# Patient Record
Sex: Female | Born: 1935
Health system: Southern US, Community
[De-identification: ages and names within clinical notes are randomized; demographics above are authoritative.]

## PROBLEM LIST (undated history)

## (undated) DIAGNOSIS — R4701 Aphasia: Secondary | ICD-10-CM

## (undated) DIAGNOSIS — M1990 Unspecified osteoarthritis, unspecified site: Secondary | ICD-10-CM

## (undated) DIAGNOSIS — I639 Cerebral infarction, unspecified: Secondary | ICD-10-CM

## (undated) DIAGNOSIS — I4891 Unspecified atrial fibrillation: Secondary | ICD-10-CM

## (undated) DIAGNOSIS — H3530 Unspecified macular degeneration: Secondary | ICD-10-CM

## (undated) DIAGNOSIS — H269 Unspecified cataract: Secondary | ICD-10-CM

## (undated) DIAGNOSIS — E079 Disorder of thyroid, unspecified: Secondary | ICD-10-CM

## (undated) HISTORY — DX: Aphasia: R47.01

## (undated) HISTORY — PX: ABDOMINAL HYSTERECTOMY: SHX81

## (undated) HISTORY — PX: EYE SURGERY: SHX253

## (undated) HISTORY — DX: Unspecified cataract: H26.9

## (undated) HISTORY — DX: Unspecified macular degeneration: H35.30

## (undated) HISTORY — DX: Cerebral infarction, unspecified: I63.9

## (undated) HISTORY — PX: JOINT REPLACEMENT: SHX530

## (undated) HISTORY — DX: Unspecified osteoarthritis, unspecified site: M19.90

## (undated) HISTORY — DX: Disorder of thyroid, unspecified: E07.9

## (undated) HISTORY — PX: BRAIN SURGERY: SHX531

---

## 2011-10-10 DIAGNOSIS — E7800 Pure hypercholesterolemia, unspecified: Secondary | ICD-10-CM | POA: Diagnosis not present

## 2011-10-10 DIAGNOSIS — E039 Hypothyroidism, unspecified: Secondary | ICD-10-CM | POA: Diagnosis not present

## 2011-10-13 DIAGNOSIS — E039 Hypothyroidism, unspecified: Secondary | ICD-10-CM | POA: Diagnosis not present

## 2011-10-13 DIAGNOSIS — R0989 Other specified symptoms and signs involving the circulatory and respiratory systems: Secondary | ICD-10-CM | POA: Diagnosis not present

## 2011-10-13 DIAGNOSIS — E7800 Pure hypercholesterolemia, unspecified: Secondary | ICD-10-CM | POA: Diagnosis not present

## 2011-10-13 DIAGNOSIS — M25569 Pain in unspecified knee: Secondary | ICD-10-CM | POA: Diagnosis not present

## 2011-10-25 DIAGNOSIS — H2589 Other age-related cataract: Secondary | ICD-10-CM | POA: Diagnosis not present

## 2011-10-25 DIAGNOSIS — H35379 Puckering of macula, unspecified eye: Secondary | ICD-10-CM | POA: Diagnosis not present

## 2011-10-25 DIAGNOSIS — H02409 Unspecified ptosis of unspecified eyelid: Secondary | ICD-10-CM | POA: Diagnosis not present

## 2011-10-25 DIAGNOSIS — Z961 Presence of intraocular lens: Secondary | ICD-10-CM | POA: Diagnosis not present

## 2011-11-01 DIAGNOSIS — M1710 Unilateral primary osteoarthritis, unspecified knee: Secondary | ICD-10-CM | POA: Diagnosis not present

## 2011-11-22 DIAGNOSIS — M1710 Unilateral primary osteoarthritis, unspecified knee: Secondary | ICD-10-CM | POA: Diagnosis not present

## 2011-11-29 DIAGNOSIS — M1710 Unilateral primary osteoarthritis, unspecified knee: Secondary | ICD-10-CM | POA: Diagnosis not present

## 2011-12-04 DIAGNOSIS — M79609 Pain in unspecified limb: Secondary | ICD-10-CM | POA: Diagnosis not present

## 2011-12-06 DIAGNOSIS — M1710 Unilateral primary osteoarthritis, unspecified knee: Secondary | ICD-10-CM | POA: Diagnosis not present

## 2011-12-11 DIAGNOSIS — M1710 Unilateral primary osteoarthritis, unspecified knee: Secondary | ICD-10-CM | POA: Diagnosis not present

## 2011-12-13 DIAGNOSIS — M1710 Unilateral primary osteoarthritis, unspecified knee: Secondary | ICD-10-CM | POA: Diagnosis not present

## 2011-12-13 DIAGNOSIS — M79609 Pain in unspecified limb: Secondary | ICD-10-CM | POA: Diagnosis not present

## 2011-12-18 DIAGNOSIS — M79609 Pain in unspecified limb: Secondary | ICD-10-CM | POA: Diagnosis not present

## 2011-12-18 DIAGNOSIS — M1710 Unilateral primary osteoarthritis, unspecified knee: Secondary | ICD-10-CM | POA: Diagnosis not present

## 2011-12-20 DIAGNOSIS — M1710 Unilateral primary osteoarthritis, unspecified knee: Secondary | ICD-10-CM | POA: Diagnosis not present

## 2011-12-20 DIAGNOSIS — M79609 Pain in unspecified limb: Secondary | ICD-10-CM | POA: Diagnosis not present

## 2011-12-25 DIAGNOSIS — M1710 Unilateral primary osteoarthritis, unspecified knee: Secondary | ICD-10-CM | POA: Diagnosis not present

## 2011-12-27 DIAGNOSIS — M1710 Unilateral primary osteoarthritis, unspecified knee: Secondary | ICD-10-CM | POA: Diagnosis not present

## 2012-01-01 DIAGNOSIS — M1710 Unilateral primary osteoarthritis, unspecified knee: Secondary | ICD-10-CM | POA: Diagnosis not present

## 2012-01-03 DIAGNOSIS — M1710 Unilateral primary osteoarthritis, unspecified knee: Secondary | ICD-10-CM | POA: Diagnosis not present

## 2012-01-08 DIAGNOSIS — M1710 Unilateral primary osteoarthritis, unspecified knee: Secondary | ICD-10-CM | POA: Diagnosis not present

## 2012-01-10 DIAGNOSIS — M1710 Unilateral primary osteoarthritis, unspecified knee: Secondary | ICD-10-CM | POA: Diagnosis not present

## 2012-01-17 DIAGNOSIS — M545 Low back pain, unspecified: Secondary | ICD-10-CM | POA: Diagnosis not present

## 2012-01-17 DIAGNOSIS — M1710 Unilateral primary osteoarthritis, unspecified knee: Secondary | ICD-10-CM | POA: Diagnosis not present

## 2012-02-01 DIAGNOSIS — M545 Low back pain, unspecified: Secondary | ICD-10-CM | POA: Diagnosis not present

## 2012-02-01 DIAGNOSIS — M1710 Unilateral primary osteoarthritis, unspecified knee: Secondary | ICD-10-CM | POA: Diagnosis not present

## 2012-02-08 DIAGNOSIS — M79609 Pain in unspecified limb: Secondary | ICD-10-CM | POA: Diagnosis not present

## 2012-02-08 DIAGNOSIS — M545 Low back pain, unspecified: Secondary | ICD-10-CM | POA: Diagnosis not present

## 2012-02-15 DIAGNOSIS — M545 Low back pain, unspecified: Secondary | ICD-10-CM | POA: Diagnosis not present

## 2012-02-15 DIAGNOSIS — M1710 Unilateral primary osteoarthritis, unspecified knee: Secondary | ICD-10-CM | POA: Diagnosis not present

## 2012-02-22 DIAGNOSIS — M1710 Unilateral primary osteoarthritis, unspecified knee: Secondary | ICD-10-CM | POA: Diagnosis not present

## 2012-02-22 DIAGNOSIS — M79609 Pain in unspecified limb: Secondary | ICD-10-CM | POA: Diagnosis not present

## 2012-02-29 DIAGNOSIS — M1710 Unilateral primary osteoarthritis, unspecified knee: Secondary | ICD-10-CM | POA: Diagnosis not present

## 2012-02-29 DIAGNOSIS — M79609 Pain in unspecified limb: Secondary | ICD-10-CM | POA: Diagnosis not present

## 2012-03-04 DIAGNOSIS — Z85828 Personal history of other malignant neoplasm of skin: Secondary | ICD-10-CM | POA: Diagnosis not present

## 2012-03-25 DIAGNOSIS — M1710 Unilateral primary osteoarthritis, unspecified knee: Secondary | ICD-10-CM | POA: Diagnosis not present

## 2012-04-11 DIAGNOSIS — E039 Hypothyroidism, unspecified: Secondary | ICD-10-CM | POA: Diagnosis not present

## 2012-04-11 DIAGNOSIS — E7800 Pure hypercholesterolemia, unspecified: Secondary | ICD-10-CM | POA: Diagnosis not present

## 2012-04-15 DIAGNOSIS — E039 Hypothyroidism, unspecified: Secondary | ICD-10-CM | POA: Diagnosis not present

## 2012-04-15 DIAGNOSIS — R030 Elevated blood-pressure reading, without diagnosis of hypertension: Secondary | ICD-10-CM | POA: Diagnosis not present

## 2012-04-15 DIAGNOSIS — E7800 Pure hypercholesterolemia, unspecified: Secondary | ICD-10-CM | POA: Diagnosis not present

## 2012-04-25 DIAGNOSIS — H2589 Other age-related cataract: Secondary | ICD-10-CM | POA: Diagnosis not present

## 2012-04-25 DIAGNOSIS — H02409 Unspecified ptosis of unspecified eyelid: Secondary | ICD-10-CM | POA: Diagnosis not present

## 2012-04-25 DIAGNOSIS — Z961 Presence of intraocular lens: Secondary | ICD-10-CM | POA: Diagnosis not present

## 2012-04-25 DIAGNOSIS — H35379 Puckering of macula, unspecified eye: Secondary | ICD-10-CM | POA: Diagnosis not present

## 2012-05-14 DIAGNOSIS — H02409 Unspecified ptosis of unspecified eyelid: Secondary | ICD-10-CM | POA: Diagnosis not present

## 2012-06-04 DIAGNOSIS — H02409 Unspecified ptosis of unspecified eyelid: Secondary | ICD-10-CM | POA: Diagnosis not present

## 2012-06-04 DIAGNOSIS — H5340 Unspecified visual field defects: Secondary | ICD-10-CM | POA: Diagnosis not present

## 2012-06-10 DIAGNOSIS — E039 Hypothyroidism, unspecified: Secondary | ICD-10-CM | POA: Diagnosis not present

## 2012-06-13 DIAGNOSIS — Z23 Encounter for immunization: Secondary | ICD-10-CM | POA: Diagnosis not present

## 2012-07-08 DIAGNOSIS — E039 Hypothyroidism, unspecified: Secondary | ICD-10-CM | POA: Diagnosis not present

## 2012-07-08 DIAGNOSIS — I10 Essential (primary) hypertension: Secondary | ICD-10-CM | POA: Diagnosis not present

## 2012-07-08 DIAGNOSIS — E7800 Pure hypercholesterolemia, unspecified: Secondary | ICD-10-CM | POA: Diagnosis not present

## 2012-07-11 DIAGNOSIS — M1710 Unilateral primary osteoarthritis, unspecified knee: Secondary | ICD-10-CM | POA: Diagnosis not present

## 2012-07-11 DIAGNOSIS — F17200 Nicotine dependence, unspecified, uncomplicated: Secondary | ICD-10-CM | POA: Diagnosis not present

## 2012-08-02 DIAGNOSIS — I10 Essential (primary) hypertension: Secondary | ICD-10-CM | POA: Diagnosis not present

## 2012-08-02 DIAGNOSIS — M25569 Pain in unspecified knee: Secondary | ICD-10-CM | POA: Diagnosis not present

## 2012-08-13 DIAGNOSIS — Z01818 Encounter for other preprocedural examination: Secondary | ICD-10-CM | POA: Diagnosis not present

## 2012-08-13 DIAGNOSIS — Z01810 Encounter for preprocedural cardiovascular examination: Secondary | ICD-10-CM | POA: Diagnosis not present

## 2012-08-13 DIAGNOSIS — R9431 Abnormal electrocardiogram [ECG] [EKG]: Secondary | ICD-10-CM | POA: Diagnosis not present

## 2012-10-02 DIAGNOSIS — M79609 Pain in unspecified limb: Secondary | ICD-10-CM | POA: Diagnosis not present

## 2012-10-02 DIAGNOSIS — Z01818 Encounter for other preprocedural examination: Secondary | ICD-10-CM | POA: Diagnosis not present

## 2012-10-02 DIAGNOSIS — M1710 Unilateral primary osteoarthritis, unspecified knee: Secondary | ICD-10-CM | POA: Diagnosis not present

## 2012-10-08 DIAGNOSIS — IMO0002 Reserved for concepts with insufficient information to code with codable children: Secondary | ICD-10-CM | POA: Diagnosis not present

## 2012-10-08 DIAGNOSIS — Z471 Aftercare following joint replacement surgery: Secondary | ICD-10-CM | POA: Diagnosis not present

## 2012-10-08 DIAGNOSIS — Z96659 Presence of unspecified artificial knee joint: Secondary | ICD-10-CM | POA: Diagnosis not present

## 2012-10-08 DIAGNOSIS — M1710 Unilateral primary osteoarthritis, unspecified knee: Secondary | ICD-10-CM | POA: Diagnosis not present

## 2012-10-08 DIAGNOSIS — Z8673 Personal history of transient ischemic attack (TIA), and cerebral infarction without residual deficits: Secondary | ICD-10-CM | POA: Diagnosis not present

## 2012-10-08 DIAGNOSIS — G8918 Other acute postprocedural pain: Secondary | ICD-10-CM | POA: Diagnosis not present

## 2012-10-08 DIAGNOSIS — M6281 Muscle weakness (generalized): Secondary | ICD-10-CM | POA: Diagnosis not present

## 2012-10-08 DIAGNOSIS — E039 Hypothyroidism, unspecified: Secondary | ICD-10-CM | POA: Diagnosis present

## 2012-10-08 DIAGNOSIS — F17200 Nicotine dependence, unspecified, uncomplicated: Secondary | ICD-10-CM | POA: Diagnosis present

## 2012-10-08 DIAGNOSIS — Z5189 Encounter for other specified aftercare: Secondary | ICD-10-CM | POA: Diagnosis not present

## 2012-10-08 DIAGNOSIS — R262 Difficulty in walking, not elsewhere classified: Secondary | ICD-10-CM | POA: Diagnosis not present

## 2012-10-08 DIAGNOSIS — E785 Hyperlipidemia, unspecified: Secondary | ICD-10-CM | POA: Diagnosis present

## 2012-10-08 DIAGNOSIS — I10 Essential (primary) hypertension: Secondary | ICD-10-CM | POA: Diagnosis present

## 2012-10-10 DIAGNOSIS — M25569 Pain in unspecified knee: Secondary | ICD-10-CM | POA: Diagnosis not present

## 2012-10-10 DIAGNOSIS — Z471 Aftercare following joint replacement surgery: Secondary | ICD-10-CM | POA: Diagnosis not present

## 2012-10-10 DIAGNOSIS — M1710 Unilateral primary osteoarthritis, unspecified knee: Secondary | ICD-10-CM | POA: Diagnosis not present

## 2012-10-10 DIAGNOSIS — R262 Difficulty in walking, not elsewhere classified: Secondary | ICD-10-CM | POA: Diagnosis not present

## 2012-10-10 DIAGNOSIS — E785 Hyperlipidemia, unspecified: Secondary | ICD-10-CM | POA: Diagnosis not present

## 2012-10-10 DIAGNOSIS — Z5189 Encounter for other specified aftercare: Secondary | ICD-10-CM | POA: Diagnosis not present

## 2012-10-10 DIAGNOSIS — M6281 Muscle weakness (generalized): Secondary | ICD-10-CM | POA: Diagnosis not present

## 2012-10-10 DIAGNOSIS — Z96659 Presence of unspecified artificial knee joint: Secondary | ICD-10-CM | POA: Diagnosis not present

## 2012-10-10 DIAGNOSIS — IMO0002 Reserved for concepts with insufficient information to code with codable children: Secondary | ICD-10-CM | POA: Diagnosis not present

## 2012-10-10 DIAGNOSIS — E039 Hypothyroidism, unspecified: Secondary | ICD-10-CM | POA: Diagnosis not present

## 2012-10-10 DIAGNOSIS — I10 Essential (primary) hypertension: Secondary | ICD-10-CM | POA: Diagnosis not present

## 2012-10-10 DIAGNOSIS — I671 Cerebral aneurysm, nonruptured: Secondary | ICD-10-CM | POA: Diagnosis not present

## 2012-10-11 DIAGNOSIS — E785 Hyperlipidemia, unspecified: Secondary | ICD-10-CM | POA: Diagnosis not present

## 2012-10-11 DIAGNOSIS — M1710 Unilateral primary osteoarthritis, unspecified knee: Secondary | ICD-10-CM | POA: Diagnosis not present

## 2012-10-11 DIAGNOSIS — I10 Essential (primary) hypertension: Secondary | ICD-10-CM | POA: Diagnosis not present

## 2012-10-11 DIAGNOSIS — M25569 Pain in unspecified knee: Secondary | ICD-10-CM | POA: Diagnosis not present

## 2012-10-11 DIAGNOSIS — E039 Hypothyroidism, unspecified: Secondary | ICD-10-CM | POA: Diagnosis not present

## 2012-10-11 DIAGNOSIS — I671 Cerebral aneurysm, nonruptured: Secondary | ICD-10-CM | POA: Diagnosis not present

## 2012-10-11 DIAGNOSIS — IMO0002 Reserved for concepts with insufficient information to code with codable children: Secondary | ICD-10-CM | POA: Diagnosis not present

## 2012-10-19 DIAGNOSIS — E039 Hypothyroidism, unspecified: Secondary | ICD-10-CM | POA: Diagnosis not present

## 2012-10-19 DIAGNOSIS — E785 Hyperlipidemia, unspecified: Secondary | ICD-10-CM | POA: Diagnosis not present

## 2012-10-19 DIAGNOSIS — M25569 Pain in unspecified knee: Secondary | ICD-10-CM | POA: Diagnosis not present

## 2012-10-19 DIAGNOSIS — I10 Essential (primary) hypertension: Secondary | ICD-10-CM | POA: Diagnosis not present

## 2012-10-23 DIAGNOSIS — E039 Hypothyroidism, unspecified: Secondary | ICD-10-CM | POA: Diagnosis not present

## 2012-10-23 DIAGNOSIS — E785 Hyperlipidemia, unspecified: Secondary | ICD-10-CM | POA: Diagnosis not present

## 2012-10-23 DIAGNOSIS — IMO0001 Reserved for inherently not codable concepts without codable children: Secondary | ICD-10-CM | POA: Diagnosis not present

## 2012-10-23 DIAGNOSIS — Z471 Aftercare following joint replacement surgery: Secondary | ICD-10-CM | POA: Diagnosis not present

## 2012-10-23 DIAGNOSIS — I10 Essential (primary) hypertension: Secondary | ICD-10-CM | POA: Diagnosis not present

## 2012-10-23 DIAGNOSIS — Z96659 Presence of unspecified artificial knee joint: Secondary | ICD-10-CM | POA: Diagnosis not present

## 2012-10-23 DIAGNOSIS — R269 Unspecified abnormalities of gait and mobility: Secondary | ICD-10-CM | POA: Diagnosis not present

## 2012-10-25 DIAGNOSIS — I10 Essential (primary) hypertension: Secondary | ICD-10-CM | POA: Diagnosis not present

## 2012-10-25 DIAGNOSIS — R269 Unspecified abnormalities of gait and mobility: Secondary | ICD-10-CM | POA: Diagnosis not present

## 2012-10-25 DIAGNOSIS — E039 Hypothyroidism, unspecified: Secondary | ICD-10-CM | POA: Diagnosis not present

## 2012-10-25 DIAGNOSIS — IMO0001 Reserved for inherently not codable concepts without codable children: Secondary | ICD-10-CM | POA: Diagnosis not present

## 2012-10-25 DIAGNOSIS — Z471 Aftercare following joint replacement surgery: Secondary | ICD-10-CM | POA: Diagnosis not present

## 2012-10-25 DIAGNOSIS — Z96659 Presence of unspecified artificial knee joint: Secondary | ICD-10-CM | POA: Diagnosis not present

## 2012-10-26 DIAGNOSIS — E039 Hypothyroidism, unspecified: Secondary | ICD-10-CM | POA: Diagnosis not present

## 2012-10-26 DIAGNOSIS — R269 Unspecified abnormalities of gait and mobility: Secondary | ICD-10-CM | POA: Diagnosis not present

## 2012-10-26 DIAGNOSIS — I10 Essential (primary) hypertension: Secondary | ICD-10-CM | POA: Diagnosis not present

## 2012-10-26 DIAGNOSIS — Z96659 Presence of unspecified artificial knee joint: Secondary | ICD-10-CM | POA: Diagnosis not present

## 2012-10-26 DIAGNOSIS — Z471 Aftercare following joint replacement surgery: Secondary | ICD-10-CM | POA: Diagnosis not present

## 2012-10-26 DIAGNOSIS — IMO0001 Reserved for inherently not codable concepts without codable children: Secondary | ICD-10-CM | POA: Diagnosis not present

## 2012-10-28 DIAGNOSIS — IMO0001 Reserved for inherently not codable concepts without codable children: Secondary | ICD-10-CM | POA: Diagnosis not present

## 2012-10-28 DIAGNOSIS — E039 Hypothyroidism, unspecified: Secondary | ICD-10-CM | POA: Diagnosis not present

## 2012-10-28 DIAGNOSIS — Z96659 Presence of unspecified artificial knee joint: Secondary | ICD-10-CM | POA: Diagnosis not present

## 2012-10-28 DIAGNOSIS — R269 Unspecified abnormalities of gait and mobility: Secondary | ICD-10-CM | POA: Diagnosis not present

## 2012-10-28 DIAGNOSIS — Z471 Aftercare following joint replacement surgery: Secondary | ICD-10-CM | POA: Diagnosis not present

## 2012-10-28 DIAGNOSIS — I10 Essential (primary) hypertension: Secondary | ICD-10-CM | POA: Diagnosis not present

## 2012-10-29 DIAGNOSIS — E039 Hypothyroidism, unspecified: Secondary | ICD-10-CM | POA: Diagnosis not present

## 2012-10-29 DIAGNOSIS — IMO0001 Reserved for inherently not codable concepts without codable children: Secondary | ICD-10-CM | POA: Diagnosis not present

## 2012-10-29 DIAGNOSIS — R269 Unspecified abnormalities of gait and mobility: Secondary | ICD-10-CM | POA: Diagnosis not present

## 2012-10-29 DIAGNOSIS — I10 Essential (primary) hypertension: Secondary | ICD-10-CM | POA: Diagnosis not present

## 2012-10-29 DIAGNOSIS — Z471 Aftercare following joint replacement surgery: Secondary | ICD-10-CM | POA: Diagnosis not present

## 2012-10-29 DIAGNOSIS — Z96659 Presence of unspecified artificial knee joint: Secondary | ICD-10-CM | POA: Diagnosis not present

## 2012-10-31 DIAGNOSIS — Z471 Aftercare following joint replacement surgery: Secondary | ICD-10-CM | POA: Diagnosis not present

## 2012-10-31 DIAGNOSIS — IMO0001 Reserved for inherently not codable concepts without codable children: Secondary | ICD-10-CM | POA: Diagnosis not present

## 2012-10-31 DIAGNOSIS — I10 Essential (primary) hypertension: Secondary | ICD-10-CM | POA: Diagnosis not present

## 2012-10-31 DIAGNOSIS — R269 Unspecified abnormalities of gait and mobility: Secondary | ICD-10-CM | POA: Diagnosis not present

## 2012-10-31 DIAGNOSIS — Z96659 Presence of unspecified artificial knee joint: Secondary | ICD-10-CM | POA: Diagnosis not present

## 2012-10-31 DIAGNOSIS — E039 Hypothyroidism, unspecified: Secondary | ICD-10-CM | POA: Diagnosis not present

## 2012-11-01 DIAGNOSIS — IMO0001 Reserved for inherently not codable concepts without codable children: Secondary | ICD-10-CM | POA: Diagnosis not present

## 2012-11-01 DIAGNOSIS — E039 Hypothyroidism, unspecified: Secondary | ICD-10-CM | POA: Diagnosis not present

## 2012-11-01 DIAGNOSIS — Z96659 Presence of unspecified artificial knee joint: Secondary | ICD-10-CM | POA: Diagnosis not present

## 2012-11-01 DIAGNOSIS — I10 Essential (primary) hypertension: Secondary | ICD-10-CM | POA: Diagnosis not present

## 2012-11-01 DIAGNOSIS — R269 Unspecified abnormalities of gait and mobility: Secondary | ICD-10-CM | POA: Diagnosis not present

## 2012-11-01 DIAGNOSIS — Z471 Aftercare following joint replacement surgery: Secondary | ICD-10-CM | POA: Diagnosis not present

## 2012-11-04 DIAGNOSIS — E039 Hypothyroidism, unspecified: Secondary | ICD-10-CM | POA: Diagnosis not present

## 2012-11-04 DIAGNOSIS — I10 Essential (primary) hypertension: Secondary | ICD-10-CM | POA: Diagnosis not present

## 2012-11-04 DIAGNOSIS — IMO0001 Reserved for inherently not codable concepts without codable children: Secondary | ICD-10-CM | POA: Diagnosis not present

## 2012-11-04 DIAGNOSIS — Z471 Aftercare following joint replacement surgery: Secondary | ICD-10-CM | POA: Diagnosis not present

## 2012-11-04 DIAGNOSIS — Z96659 Presence of unspecified artificial knee joint: Secondary | ICD-10-CM | POA: Diagnosis not present

## 2012-11-04 DIAGNOSIS — R269 Unspecified abnormalities of gait and mobility: Secondary | ICD-10-CM | POA: Diagnosis not present

## 2012-11-05 DIAGNOSIS — I10 Essential (primary) hypertension: Secondary | ICD-10-CM | POA: Diagnosis not present

## 2012-11-05 DIAGNOSIS — Z96659 Presence of unspecified artificial knee joint: Secondary | ICD-10-CM | POA: Diagnosis not present

## 2012-11-05 DIAGNOSIS — Z471 Aftercare following joint replacement surgery: Secondary | ICD-10-CM | POA: Diagnosis not present

## 2012-11-05 DIAGNOSIS — E039 Hypothyroidism, unspecified: Secondary | ICD-10-CM | POA: Diagnosis not present

## 2012-11-05 DIAGNOSIS — IMO0001 Reserved for inherently not codable concepts without codable children: Secondary | ICD-10-CM | POA: Diagnosis not present

## 2012-11-05 DIAGNOSIS — R269 Unspecified abnormalities of gait and mobility: Secondary | ICD-10-CM | POA: Diagnosis not present

## 2012-11-08 DIAGNOSIS — M25569 Pain in unspecified knee: Secondary | ICD-10-CM | POA: Diagnosis not present

## 2012-11-11 DIAGNOSIS — M79609 Pain in unspecified limb: Secondary | ICD-10-CM | POA: Diagnosis not present

## 2012-11-13 DIAGNOSIS — M25569 Pain in unspecified knee: Secondary | ICD-10-CM | POA: Diagnosis not present

## 2012-11-15 DIAGNOSIS — M25569 Pain in unspecified knee: Secondary | ICD-10-CM | POA: Diagnosis not present

## 2012-11-18 DIAGNOSIS — M25569 Pain in unspecified knee: Secondary | ICD-10-CM | POA: Diagnosis not present

## 2012-11-20 DIAGNOSIS — M25569 Pain in unspecified knee: Secondary | ICD-10-CM | POA: Diagnosis not present

## 2012-11-22 DIAGNOSIS — M25569 Pain in unspecified knee: Secondary | ICD-10-CM | POA: Diagnosis not present

## 2012-11-26 DIAGNOSIS — M25569 Pain in unspecified knee: Secondary | ICD-10-CM | POA: Diagnosis not present

## 2012-11-28 DIAGNOSIS — M25569 Pain in unspecified knee: Secondary | ICD-10-CM | POA: Diagnosis not present

## 2012-12-04 DIAGNOSIS — Z471 Aftercare following joint replacement surgery: Secondary | ICD-10-CM | POA: Diagnosis not present

## 2012-12-06 DIAGNOSIS — M25569 Pain in unspecified knee: Secondary | ICD-10-CM | POA: Diagnosis not present

## 2012-12-12 DIAGNOSIS — Z961 Presence of intraocular lens: Secondary | ICD-10-CM | POA: Diagnosis not present

## 2012-12-12 DIAGNOSIS — H35379 Puckering of macula, unspecified eye: Secondary | ICD-10-CM | POA: Diagnosis not present

## 2012-12-12 DIAGNOSIS — H353190 Nonexudative age-related macular degeneration, unspecified eye, stage unspecified: Secondary | ICD-10-CM | POA: Diagnosis not present

## 2012-12-12 DIAGNOSIS — H2589 Other age-related cataract: Secondary | ICD-10-CM | POA: Diagnosis not present

## 2012-12-16 DIAGNOSIS — E785 Hyperlipidemia, unspecified: Secondary | ICD-10-CM | POA: Diagnosis not present

## 2012-12-16 DIAGNOSIS — D649 Anemia, unspecified: Secondary | ICD-10-CM | POA: Diagnosis not present

## 2012-12-16 DIAGNOSIS — I10 Essential (primary) hypertension: Secondary | ICD-10-CM | POA: Diagnosis not present

## 2012-12-16 DIAGNOSIS — E039 Hypothyroidism, unspecified: Secondary | ICD-10-CM | POA: Diagnosis not present

## 2013-01-14 DIAGNOSIS — H2589 Other age-related cataract: Secondary | ICD-10-CM | POA: Diagnosis not present

## 2013-02-07 DIAGNOSIS — M5126 Other intervertebral disc displacement, lumbar region: Secondary | ICD-10-CM | POA: Diagnosis not present

## 2013-02-07 DIAGNOSIS — M62838 Other muscle spasm: Secondary | ICD-10-CM | POA: Diagnosis not present

## 2013-02-07 DIAGNOSIS — M999 Biomechanical lesion, unspecified: Secondary | ICD-10-CM | POA: Diagnosis not present

## 2013-02-07 DIAGNOSIS — IMO0001 Reserved for inherently not codable concepts without codable children: Secondary | ICD-10-CM | POA: Diagnosis not present

## 2013-02-11 DIAGNOSIS — IMO0001 Reserved for inherently not codable concepts without codable children: Secondary | ICD-10-CM | POA: Diagnosis not present

## 2013-02-11 DIAGNOSIS — M999 Biomechanical lesion, unspecified: Secondary | ICD-10-CM | POA: Diagnosis not present

## 2013-02-11 DIAGNOSIS — M62838 Other muscle spasm: Secondary | ICD-10-CM | POA: Diagnosis not present

## 2013-02-11 DIAGNOSIS — M5126 Other intervertebral disc displacement, lumbar region: Secondary | ICD-10-CM | POA: Diagnosis not present

## 2013-02-12 DIAGNOSIS — IMO0001 Reserved for inherently not codable concepts without codable children: Secondary | ICD-10-CM | POA: Diagnosis not present

## 2013-02-12 DIAGNOSIS — M5126 Other intervertebral disc displacement, lumbar region: Secondary | ICD-10-CM | POA: Diagnosis not present

## 2013-02-12 DIAGNOSIS — M999 Biomechanical lesion, unspecified: Secondary | ICD-10-CM | POA: Diagnosis not present

## 2013-02-12 DIAGNOSIS — M62838 Other muscle spasm: Secondary | ICD-10-CM | POA: Diagnosis not present

## 2013-02-14 DIAGNOSIS — M999 Biomechanical lesion, unspecified: Secondary | ICD-10-CM | POA: Diagnosis not present

## 2013-02-14 DIAGNOSIS — IMO0001 Reserved for inherently not codable concepts without codable children: Secondary | ICD-10-CM | POA: Diagnosis not present

## 2013-02-14 DIAGNOSIS — M5126 Other intervertebral disc displacement, lumbar region: Secondary | ICD-10-CM | POA: Diagnosis not present

## 2013-02-14 DIAGNOSIS — M62838 Other muscle spasm: Secondary | ICD-10-CM | POA: Diagnosis not present

## 2013-02-17 DIAGNOSIS — M999 Biomechanical lesion, unspecified: Secondary | ICD-10-CM | POA: Diagnosis not present

## 2013-02-17 DIAGNOSIS — IMO0001 Reserved for inherently not codable concepts without codable children: Secondary | ICD-10-CM | POA: Diagnosis not present

## 2013-02-17 DIAGNOSIS — M5126 Other intervertebral disc displacement, lumbar region: Secondary | ICD-10-CM | POA: Diagnosis not present

## 2013-02-17 DIAGNOSIS — M62838 Other muscle spasm: Secondary | ICD-10-CM | POA: Diagnosis not present

## 2013-02-19 DIAGNOSIS — M999 Biomechanical lesion, unspecified: Secondary | ICD-10-CM | POA: Diagnosis not present

## 2013-02-19 DIAGNOSIS — IMO0001 Reserved for inherently not codable concepts without codable children: Secondary | ICD-10-CM | POA: Diagnosis not present

## 2013-02-19 DIAGNOSIS — M62838 Other muscle spasm: Secondary | ICD-10-CM | POA: Diagnosis not present

## 2013-02-19 DIAGNOSIS — M5126 Other intervertebral disc displacement, lumbar region: Secondary | ICD-10-CM | POA: Diagnosis not present

## 2013-02-21 DIAGNOSIS — M5126 Other intervertebral disc displacement, lumbar region: Secondary | ICD-10-CM | POA: Diagnosis not present

## 2013-02-21 DIAGNOSIS — M62838 Other muscle spasm: Secondary | ICD-10-CM | POA: Diagnosis not present

## 2013-02-21 DIAGNOSIS — IMO0001 Reserved for inherently not codable concepts without codable children: Secondary | ICD-10-CM | POA: Diagnosis not present

## 2013-02-21 DIAGNOSIS — M999 Biomechanical lesion, unspecified: Secondary | ICD-10-CM | POA: Diagnosis not present

## 2013-02-24 DIAGNOSIS — IMO0001 Reserved for inherently not codable concepts without codable children: Secondary | ICD-10-CM | POA: Diagnosis not present

## 2013-02-24 DIAGNOSIS — M999 Biomechanical lesion, unspecified: Secondary | ICD-10-CM | POA: Diagnosis not present

## 2013-02-24 DIAGNOSIS — M62838 Other muscle spasm: Secondary | ICD-10-CM | POA: Diagnosis not present

## 2013-02-24 DIAGNOSIS — M5126 Other intervertebral disc displacement, lumbar region: Secondary | ICD-10-CM | POA: Diagnosis not present

## 2013-02-26 DIAGNOSIS — IMO0001 Reserved for inherently not codable concepts without codable children: Secondary | ICD-10-CM | POA: Diagnosis not present

## 2013-02-26 DIAGNOSIS — M999 Biomechanical lesion, unspecified: Secondary | ICD-10-CM | POA: Diagnosis not present

## 2013-02-26 DIAGNOSIS — M62838 Other muscle spasm: Secondary | ICD-10-CM | POA: Diagnosis not present

## 2013-02-26 DIAGNOSIS — M5126 Other intervertebral disc displacement, lumbar region: Secondary | ICD-10-CM | POA: Diagnosis not present

## 2013-02-28 DIAGNOSIS — IMO0001 Reserved for inherently not codable concepts without codable children: Secondary | ICD-10-CM | POA: Diagnosis not present

## 2013-02-28 DIAGNOSIS — M999 Biomechanical lesion, unspecified: Secondary | ICD-10-CM | POA: Diagnosis not present

## 2013-02-28 DIAGNOSIS — M62838 Other muscle spasm: Secondary | ICD-10-CM | POA: Diagnosis not present

## 2013-02-28 DIAGNOSIS — M5126 Other intervertebral disc displacement, lumbar region: Secondary | ICD-10-CM | POA: Diagnosis not present

## 2013-03-04 DIAGNOSIS — D485 Neoplasm of uncertain behavior of skin: Secondary | ICD-10-CM | POA: Diagnosis not present

## 2013-03-05 DIAGNOSIS — IMO0001 Reserved for inherently not codable concepts without codable children: Secondary | ICD-10-CM | POA: Diagnosis not present

## 2013-03-05 DIAGNOSIS — M5126 Other intervertebral disc displacement, lumbar region: Secondary | ICD-10-CM | POA: Diagnosis not present

## 2013-03-05 DIAGNOSIS — M999 Biomechanical lesion, unspecified: Secondary | ICD-10-CM | POA: Diagnosis not present

## 2013-03-05 DIAGNOSIS — M62838 Other muscle spasm: Secondary | ICD-10-CM | POA: Diagnosis not present

## 2013-03-12 DIAGNOSIS — M999 Biomechanical lesion, unspecified: Secondary | ICD-10-CM | POA: Diagnosis not present

## 2013-03-12 DIAGNOSIS — IMO0001 Reserved for inherently not codable concepts without codable children: Secondary | ICD-10-CM | POA: Diagnosis not present

## 2013-03-12 DIAGNOSIS — M62838 Other muscle spasm: Secondary | ICD-10-CM | POA: Diagnosis not present

## 2013-03-12 DIAGNOSIS — M5126 Other intervertebral disc displacement, lumbar region: Secondary | ICD-10-CM | POA: Diagnosis not present

## 2013-04-10 DIAGNOSIS — Z1231 Encounter for screening mammogram for malignant neoplasm of breast: Secondary | ICD-10-CM | POA: Diagnosis not present

## 2013-06-13 DIAGNOSIS — E039 Hypothyroidism, unspecified: Secondary | ICD-10-CM | POA: Diagnosis not present

## 2013-06-13 DIAGNOSIS — E7800 Pure hypercholesterolemia, unspecified: Secondary | ICD-10-CM | POA: Diagnosis not present

## 2013-06-17 DIAGNOSIS — E039 Hypothyroidism, unspecified: Secondary | ICD-10-CM | POA: Diagnosis not present

## 2013-06-17 DIAGNOSIS — Z23 Encounter for immunization: Secondary | ICD-10-CM | POA: Diagnosis not present

## 2013-06-17 DIAGNOSIS — E7800 Pure hypercholesterolemia, unspecified: Secondary | ICD-10-CM | POA: Diagnosis not present

## 2013-06-17 DIAGNOSIS — I10 Essential (primary) hypertension: Secondary | ICD-10-CM | POA: Diagnosis not present

## 2013-09-01 ENCOUNTER — Ambulatory Visit (INDEPENDENT_AMBULATORY_CARE_PROVIDER_SITE_OTHER): Payer: Medicare Other | Admitting: Family Medicine

## 2013-09-01 VITALS — BP 126/80 | HR 80 | Temp 98.0°F | Resp 16 | Ht 64.0 in | Wt 145.0 lb

## 2013-09-01 DIAGNOSIS — M25569 Pain in unspecified knee: Secondary | ICD-10-CM

## 2013-09-01 DIAGNOSIS — M25561 Pain in right knee: Secondary | ICD-10-CM

## 2013-09-01 DIAGNOSIS — S81009A Unspecified open wound, unspecified knee, initial encounter: Secondary | ICD-10-CM

## 2013-09-01 DIAGNOSIS — S81801A Unspecified open wound, right lower leg, initial encounter: Secondary | ICD-10-CM

## 2013-09-01 DIAGNOSIS — S81809A Unspecified open wound, unspecified lower leg, initial encounter: Secondary | ICD-10-CM | POA: Diagnosis not present

## 2013-09-01 LAB — POCT CBC
Granulocyte percent: 73.1 %G (ref 37–80)
HCT, POC: 49.1 % — AB (ref 37.7–47.9)
Hemoglobin: 15.4 g/dL (ref 12.2–16.2)
Lymph, poc: 2.1 (ref 0.6–3.4)
MCH, POC: 31.6 pg — AB (ref 27–31.2)
MCHC: 31.4 g/dL — AB (ref 31.8–35.4)
MCV: 100.7 fL — AB (ref 80–97)
MID (cbc): 0.7 (ref 0–0.9)
MPV: 9.1 fL (ref 0–99.8)
POC Granulocyte: 7.4 — AB (ref 2–6.9)
POC LYMPH PERCENT: 20.4 %L (ref 10–50)
POC MID %: 6.5 %M (ref 0–12)
Platelet Count, POC: 260 10*3/uL (ref 142–424)
RBC: 4.88 M/uL (ref 4.04–5.48)
RDW, POC: 14.9 %
WBC: 10.1 10*3/uL (ref 4.6–10.2)

## 2013-09-01 MED ORDER — DOXYCYCLINE HYCLATE 100 MG PO TABS: 100.0000 mg | ORAL_TABLET | Freq: Two times a day (BID) | ORAL | Status: DC

## 2013-09-01 NOTE — Progress Notes (Signed)
Chief Complaint:  Chief Complaint  Patient presents with  . Leg Injury    treadmill injury, fell off and brusied/lacerated right shin 1 week ago (wants medications printed if anything is prescribed today)    HPI: Peggy Williams is a 79 y.o. female who is here for  Left shin injury when she got her leg  Stuck against treadmill that was moving She states the wound has gotten worse, she has tried otc abx ointment and has had nrusing care daily at her assistant living facility She has some discomfort and states it has gotten worse in terms of size, she has been seen by a nurse at the nursing home.  She has been able to bear weight, no leg pain on walking. Denies fevers or chills She denies diabetes, She is on asa She is a smoker 4-6 a day She denies osteoporosis or osteopenia She lives at [facility] assisted living, has only been there 2 months was living in [place], near [place], [place] Her son Simonne Come Pa-C lives in [place]  Past Medical History  Diagnosis Date  . Arthritis   . Cataract   . Stroke   . Thyroid disease    Past Surgical History  Procedure Laterality Date  . Brain surgery    . Eye surgery    . Abdominal hysterectomy    . Joint replacement     History   Social History  . Marital Status: Widowed    Spouse Name: N/A    Number of Children: N/A  . Years of Education: N/A   Social History Main Topics  . Smoking status: Current Every Day Smoker -- 0.25 packs/day for 65 years    Types: Cigarettes  . Smokeless tobacco: None  . Alcohol Use: None  . Drug Use: None  . Sexual Activity: None   Other Topics Concern  . None   Social History Narrative  . None   No family history on file. No Known Allergies Prior to Admission medications   Medication Sig Start Date End Date Taking? Authorizing Provider  aspirin 81 MG tablet Take 81 mg by mouth daily.   Yes Historical Provider, MD  hydrochlorothiazide (MICROZIDE) 12.5 MG capsule Take 12.5 mg  by mouth daily.   Yes Historical Provider, MD  levothyroxine (SYNTHROID, LEVOTHROID) 50 MCG tablet Take 50 mcg by mouth daily before breakfast.   Yes Historical Provider, MD  simvastatin (ZOCOR) 10 MG tablet Take 10 mg by mouth daily.   Yes Historical Provider, MD     ROS: The patient denies fevers, chills, night sweats, unintentional weight loss, chest pain, palpitations, wheezing, dyspnea on exertion, nausea, vomiting, abdominal pain, dysuria, hematuria, melena, numbness, weakness, or tingling.   All other systems have been reviewed and were otherwise negative with the exception of those mentioned in the HPI and as above.    PHYSICAL EXAM: Filed Vitals:   09/01/13 1520  BP: 126/80  Pulse: 80  Temp: 98 F (36.7 C)  Resp: 16   Filed Vitals:   09/01/13 1520  Height: 5\' 4"  (1.626 m)  Weight: 145 lb (65.772 kg)   Body mass index is 24.88 kg/(m^2).  General: Alert, no acute distress HEENT:  Normocephalic, atraumatic, oropharynx patent. EOMI, PERRLA Cardiovascular:  Regular rate and rhythm, no rubs murmurs or gallops.  No Carotid bruits, radial pulse intact. No pedal edema.  Respiratory: Clear to auscultation bilaterally.  No wheezes, rales, or rhonchi.  No cyanosis, no use of accessory musculature  GI: No organomegaly, abdomen is soft and non-tender, positive bowel sounds.  No masses. Skin: + erythematous  granuated tissues with serosainguinsous drainage, there is some periwound erythema and warmth that is slightly more than normal in wound healing. + tenderness around periwound.  Neurologic: Facial musculature symmetric. She has some dysphasia  Or word finding difficulties due to prior stroke from a ruptured aneurysm  Psychiatric: Patient is appropriate throughout our interaction. Lymphatic: No cervical lymphadenopathy Musculoskeletal: Gait intact. 5/5 strength UE and Violanda Bobeck   LABS: Results for orders placed in visit on 09/01/13  POCT CBC      Result Value Range   WBC 10.1  4.6 -  10.2 K/uL   Lymph, poc 2.1  0.6 - 3.4   POC LYMPH PERCENT 20.4  10 - 50 %L   MID (cbc) 0.7  0 - 0.9   POC MID % 6.5  0 - 12 %M   POC Granulocyte 7.4 (*) 2 - 6.9   Granulocyte percent 73.1  37 - 80 %G   RBC 4.88  4.04 - 5.48 M/uL   Hemoglobin 15.4  12.2 - 16.2 g/dL   HCT, POC 16.1 (*) 09.6 - 47.9 %   MCV 100.7 (*) 80 - 97 fL   MCH, POC 31.6 (*) 27 - 31.2 pg   MCHC 31.4 (*) 31.8 - 35.4 g/dL   RDW, POC 04.5     Platelet Count, POC 260  142 - 424 K/uL   MPV 9.1  0 - 99.8 fL     EKG/XRAY:   Primary read interpreted by Dr. Conley Rolls at [facility].   ASSESSMENT/PLAN: Encounter Diagnosis  Name Primary?  . Leg wound, right, initial encounter Yes   Skin tear that is possibly tendering towards infection Spoke with her son Simonne Come and d/w him plan .  He is in agreement with plan Rx Doxycycline BID x 10 days for preventive treatment of possible cellulitis around periwound.  Daily wound care  With sopa and water and dry dressing with silvidene  At [facility] assistant Living by nursing staff F/u prn   Gross sideeffects, risk and benefits, and alternatives of medications d/w patient. Patient is aware that all medications have potential sideeffects and we are unable to predict every sideeffect or drug-drug interaction that may occur.  Hamilton Capri PHUONG, DO 09/01/2013 3:57 PM

## 2013-09-05 ENCOUNTER — Encounter: Payer: Self-pay | Admitting: Family Medicine

## 2013-09-18 DIAGNOSIS — Z23 Encounter for immunization: Secondary | ICD-10-CM | POA: Diagnosis not present

## 2013-09-18 DIAGNOSIS — E7800 Pure hypercholesterolemia, unspecified: Secondary | ICD-10-CM | POA: Diagnosis not present

## 2013-09-18 DIAGNOSIS — E039 Hypothyroidism, unspecified: Secondary | ICD-10-CM | POA: Diagnosis not present

## 2013-09-18 DIAGNOSIS — T148XXA Other injury of unspecified body region, initial encounter: Secondary | ICD-10-CM | POA: Diagnosis not present

## 2013-09-18 DIAGNOSIS — I10 Essential (primary) hypertension: Secondary | ICD-10-CM | POA: Diagnosis not present

## 2013-12-17 DIAGNOSIS — Z961 Presence of intraocular lens: Secondary | ICD-10-CM | POA: Diagnosis not present

## 2013-12-17 DIAGNOSIS — H3530 Unspecified macular degeneration: Secondary | ICD-10-CM | POA: Diagnosis not present

## 2014-03-19 DIAGNOSIS — Z79899 Other long term (current) drug therapy: Secondary | ICD-10-CM | POA: Diagnosis not present

## 2014-03-19 DIAGNOSIS — Z23 Encounter for immunization: Secondary | ICD-10-CM | POA: Diagnosis not present

## 2014-03-19 DIAGNOSIS — Z1331 Encounter for screening for depression: Secondary | ICD-10-CM | POA: Diagnosis not present

## 2014-03-19 DIAGNOSIS — Z0000 Encounter for general adult medical examination without abnormal findings: Secondary | ICD-10-CM | POA: Diagnosis not present

## 2014-03-19 DIAGNOSIS — E039 Hypothyroidism, unspecified: Secondary | ICD-10-CM | POA: Diagnosis not present

## 2014-03-19 DIAGNOSIS — I129 Hypertensive chronic kidney disease with stage 1 through stage 4 chronic kidney disease, or unspecified chronic kidney disease: Secondary | ICD-10-CM | POA: Diagnosis not present

## 2014-03-19 DIAGNOSIS — E7800 Pure hypercholesterolemia, unspecified: Secondary | ICD-10-CM | POA: Diagnosis not present

## 2014-03-19 DIAGNOSIS — F17200 Nicotine dependence, unspecified, uncomplicated: Secondary | ICD-10-CM | POA: Diagnosis not present

## 2014-03-19 DIAGNOSIS — I10 Essential (primary) hypertension: Secondary | ICD-10-CM | POA: Diagnosis not present

## 2014-04-08 DIAGNOSIS — H04229 Epiphora due to insufficient drainage, unspecified lacrimal gland: Secondary | ICD-10-CM | POA: Diagnosis not present

## 2014-04-13 DIAGNOSIS — F17200 Nicotine dependence, unspecified, uncomplicated: Secondary | ICD-10-CM | POA: Diagnosis not present

## 2014-05-29 DIAGNOSIS — L738 Other specified follicular disorders: Secondary | ICD-10-CM | POA: Diagnosis not present

## 2014-05-29 DIAGNOSIS — L820 Inflamed seborrheic keratosis: Secondary | ICD-10-CM | POA: Diagnosis not present

## 2014-05-29 DIAGNOSIS — L570 Actinic keratosis: Secondary | ICD-10-CM | POA: Diagnosis not present

## 2014-05-29 DIAGNOSIS — L578 Other skin changes due to chronic exposure to nonionizing radiation: Secondary | ICD-10-CM | POA: Diagnosis not present

## 2014-06-18 DIAGNOSIS — E039 Hypothyroidism, unspecified: Secondary | ICD-10-CM | POA: Diagnosis not present

## 2014-09-17 DIAGNOSIS — I129 Hypertensive chronic kidney disease with stage 1 through stage 4 chronic kidney disease, or unspecified chronic kidney disease: Secondary | ICD-10-CM | POA: Diagnosis not present

## 2014-09-17 DIAGNOSIS — N183 Chronic kidney disease, stage 3 (moderate): Secondary | ICD-10-CM | POA: Diagnosis not present

## 2014-09-17 DIAGNOSIS — E780 Pure hypercholesterolemia: Secondary | ICD-10-CM | POA: Diagnosis not present

## 2014-09-17 DIAGNOSIS — Z79899 Other long term (current) drug therapy: Secondary | ICD-10-CM | POA: Diagnosis not present

## 2014-09-17 DIAGNOSIS — R0781 Pleurodynia: Secondary | ICD-10-CM | POA: Diagnosis not present

## 2014-11-18 DIAGNOSIS — H3531 Nonexudative age-related macular degeneration: Secondary | ICD-10-CM | POA: Diagnosis not present

## 2014-11-18 DIAGNOSIS — Z961 Presence of intraocular lens: Secondary | ICD-10-CM | POA: Diagnosis not present

## 2014-12-04 DIAGNOSIS — C44622 Squamous cell carcinoma of skin of right upper limb, including shoulder: Secondary | ICD-10-CM | POA: Diagnosis not present

## 2014-12-04 DIAGNOSIS — L570 Actinic keratosis: Secondary | ICD-10-CM | POA: Diagnosis not present

## 2014-12-04 DIAGNOSIS — L578 Other skin changes due to chronic exposure to nonionizing radiation: Secondary | ICD-10-CM | POA: Diagnosis not present

## 2014-12-04 DIAGNOSIS — L814 Other melanin hyperpigmentation: Secondary | ICD-10-CM | POA: Diagnosis not present

## 2014-12-04 DIAGNOSIS — D485 Neoplasm of uncertain behavior of skin: Secondary | ICD-10-CM | POA: Diagnosis not present

## 2015-01-01 DIAGNOSIS — C44621 Squamous cell carcinoma of skin of unspecified upper limb, including shoulder: Secondary | ICD-10-CM | POA: Diagnosis not present

## 2015-01-01 DIAGNOSIS — L578 Other skin changes due to chronic exposure to nonionizing radiation: Secondary | ICD-10-CM | POA: Diagnosis not present

## 2015-01-29 DIAGNOSIS — C44621 Squamous cell carcinoma of skin of unspecified upper limb, including shoulder: Secondary | ICD-10-CM | POA: Diagnosis not present

## 2015-01-29 DIAGNOSIS — L578 Other skin changes due to chronic exposure to nonionizing radiation: Secondary | ICD-10-CM | POA: Diagnosis not present

## 2015-01-29 DIAGNOSIS — L814 Other melanin hyperpigmentation: Secondary | ICD-10-CM | POA: Diagnosis not present

## 2015-03-25 DIAGNOSIS — I6930 Unspecified sequelae of cerebral infarction: Secondary | ICD-10-CM | POA: Diagnosis not present

## 2015-03-25 DIAGNOSIS — F17210 Nicotine dependence, cigarettes, uncomplicated: Secondary | ICD-10-CM | POA: Diagnosis not present

## 2015-03-25 DIAGNOSIS — I129 Hypertensive chronic kidney disease with stage 1 through stage 4 chronic kidney disease, or unspecified chronic kidney disease: Secondary | ICD-10-CM | POA: Diagnosis not present

## 2015-03-25 DIAGNOSIS — Z0000 Encounter for general adult medical examination without abnormal findings: Secondary | ICD-10-CM | POA: Diagnosis not present

## 2015-03-25 DIAGNOSIS — Z79899 Other long term (current) drug therapy: Secondary | ICD-10-CM | POA: Diagnosis not present

## 2015-03-25 DIAGNOSIS — E039 Hypothyroidism, unspecified: Secondary | ICD-10-CM | POA: Diagnosis not present

## 2015-03-25 DIAGNOSIS — Z1389 Encounter for screening for other disorder: Secondary | ICD-10-CM | POA: Diagnosis not present

## 2015-03-25 DIAGNOSIS — F17200 Nicotine dependence, unspecified, uncomplicated: Secondary | ICD-10-CM | POA: Diagnosis not present

## 2015-03-25 DIAGNOSIS — M8580 Other specified disorders of bone density and structure, unspecified site: Secondary | ICD-10-CM | POA: Diagnosis not present

## 2015-03-25 DIAGNOSIS — N183 Chronic kidney disease, stage 3 (moderate): Secondary | ICD-10-CM | POA: Diagnosis not present

## 2015-03-25 DIAGNOSIS — E780 Pure hypercholesterolemia: Secondary | ICD-10-CM | POA: Diagnosis not present

## 2015-04-01 DIAGNOSIS — M899 Disorder of bone, unspecified: Secondary | ICD-10-CM | POA: Diagnosis not present

## 2015-04-01 DIAGNOSIS — M8589 Other specified disorders of bone density and structure, multiple sites: Secondary | ICD-10-CM | POA: Diagnosis not present

## 2015-05-07 DIAGNOSIS — L578 Other skin changes due to chronic exposure to nonionizing radiation: Secondary | ICD-10-CM | POA: Diagnosis not present

## 2015-05-07 DIAGNOSIS — C44621 Squamous cell carcinoma of skin of unspecified upper limb, including shoulder: Secondary | ICD-10-CM | POA: Diagnosis not present

## 2015-05-07 DIAGNOSIS — L814 Other melanin hyperpigmentation: Secondary | ICD-10-CM | POA: Diagnosis not present

## 2015-10-08 DIAGNOSIS — I129 Hypertensive chronic kidney disease with stage 1 through stage 4 chronic kidney disease, or unspecified chronic kidney disease: Secondary | ICD-10-CM | POA: Diagnosis not present

## 2015-10-08 DIAGNOSIS — N183 Chronic kidney disease, stage 3 (moderate): Secondary | ICD-10-CM | POA: Diagnosis not present

## 2015-10-20 DIAGNOSIS — Z961 Presence of intraocular lens: Secondary | ICD-10-CM | POA: Diagnosis not present

## 2015-10-20 DIAGNOSIS — H353132 Nonexudative age-related macular degeneration, bilateral, intermediate dry stage: Secondary | ICD-10-CM | POA: Diagnosis not present

## 2015-10-21 DIAGNOSIS — L814 Other melanin hyperpigmentation: Secondary | ICD-10-CM | POA: Diagnosis not present

## 2015-10-21 DIAGNOSIS — L578 Other skin changes due to chronic exposure to nonionizing radiation: Secondary | ICD-10-CM | POA: Diagnosis not present

## 2016-04-04 DIAGNOSIS — D692 Other nonthrombocytopenic purpura: Secondary | ICD-10-CM | POA: Diagnosis not present

## 2016-04-04 DIAGNOSIS — Z0000 Encounter for general adult medical examination without abnormal findings: Secondary | ICD-10-CM | POA: Diagnosis not present

## 2016-04-04 DIAGNOSIS — N183 Chronic kidney disease, stage 3 (moderate): Secondary | ICD-10-CM | POA: Diagnosis not present

## 2016-04-04 DIAGNOSIS — Z1389 Encounter for screening for other disorder: Secondary | ICD-10-CM | POA: Diagnosis not present

## 2016-04-04 DIAGNOSIS — E039 Hypothyroidism, unspecified: Secondary | ICD-10-CM | POA: Diagnosis not present

## 2016-04-04 DIAGNOSIS — Z79899 Other long term (current) drug therapy: Secondary | ICD-10-CM | POA: Diagnosis not present

## 2016-04-04 DIAGNOSIS — E7800 Pure hypercholesterolemia, unspecified: Secondary | ICD-10-CM | POA: Diagnosis not present

## 2016-04-04 DIAGNOSIS — I129 Hypertensive chronic kidney disease with stage 1 through stage 4 chronic kidney disease, or unspecified chronic kidney disease: Secondary | ICD-10-CM | POA: Diagnosis not present

## 2016-10-04 DIAGNOSIS — L853 Xerosis cutis: Secondary | ICD-10-CM | POA: Diagnosis not present

## 2016-10-04 DIAGNOSIS — Z79899 Other long term (current) drug therapy: Secondary | ICD-10-CM | POA: Diagnosis not present

## 2016-10-04 DIAGNOSIS — N183 Chronic kidney disease, stage 3 (moderate): Secondary | ICD-10-CM | POA: Diagnosis not present

## 2016-10-04 DIAGNOSIS — I69320 Aphasia following cerebral infarction: Secondary | ICD-10-CM | POA: Diagnosis not present

## 2016-10-04 DIAGNOSIS — I129 Hypertensive chronic kidney disease with stage 1 through stage 4 chronic kidney disease, or unspecified chronic kidney disease: Secondary | ICD-10-CM | POA: Diagnosis not present

## 2016-10-31 DIAGNOSIS — L821 Other seborrheic keratosis: Secondary | ICD-10-CM | POA: Diagnosis not present

## 2016-10-31 DIAGNOSIS — Z85828 Personal history of other malignant neoplasm of skin: Secondary | ICD-10-CM | POA: Diagnosis not present

## 2016-10-31 DIAGNOSIS — L814 Other melanin hyperpigmentation: Secondary | ICD-10-CM | POA: Diagnosis not present

## 2016-11-15 DIAGNOSIS — Z961 Presence of intraocular lens: Secondary | ICD-10-CM | POA: Diagnosis not present

## 2016-11-15 DIAGNOSIS — H353121 Nonexudative age-related macular degeneration, left eye, early dry stage: Secondary | ICD-10-CM | POA: Diagnosis not present

## 2016-11-15 DIAGNOSIS — H353112 Nonexudative age-related macular degeneration, right eye, intermediate dry stage: Secondary | ICD-10-CM | POA: Diagnosis not present

## 2017-04-06 DIAGNOSIS — I129 Hypertensive chronic kidney disease with stage 1 through stage 4 chronic kidney disease, or unspecified chronic kidney disease: Secondary | ICD-10-CM | POA: Diagnosis not present

## 2017-04-06 DIAGNOSIS — E039 Hypothyroidism, unspecified: Secondary | ICD-10-CM | POA: Diagnosis not present

## 2017-04-06 DIAGNOSIS — Z0000 Encounter for general adult medical examination without abnormal findings: Secondary | ICD-10-CM | POA: Diagnosis not present

## 2017-04-06 DIAGNOSIS — H900 Conductive hearing loss, bilateral: Secondary | ICD-10-CM | POA: Diagnosis not present

## 2017-04-06 DIAGNOSIS — N183 Chronic kidney disease, stage 3 (moderate): Secondary | ICD-10-CM | POA: Diagnosis not present

## 2017-04-06 DIAGNOSIS — M8580 Other specified disorders of bone density and structure, unspecified site: Secondary | ICD-10-CM | POA: Diagnosis not present

## 2017-04-06 DIAGNOSIS — Z79899 Other long term (current) drug therapy: Secondary | ICD-10-CM | POA: Diagnosis not present

## 2017-04-06 DIAGNOSIS — Z1389 Encounter for screening for other disorder: Secondary | ICD-10-CM | POA: Diagnosis not present

## 2017-04-17 DIAGNOSIS — H6123 Impacted cerumen, bilateral: Secondary | ICD-10-CM | POA: Diagnosis not present

## 2017-04-20 DIAGNOSIS — H903 Sensorineural hearing loss, bilateral: Secondary | ICD-10-CM | POA: Diagnosis not present

## 2017-06-26 DIAGNOSIS — Z85828 Personal history of other malignant neoplasm of skin: Secondary | ICD-10-CM | POA: Diagnosis not present

## 2017-06-26 DIAGNOSIS — L814 Other melanin hyperpigmentation: Secondary | ICD-10-CM | POA: Diagnosis not present

## 2017-06-26 DIAGNOSIS — L821 Other seborrheic keratosis: Secondary | ICD-10-CM | POA: Diagnosis not present

## 2017-07-18 DIAGNOSIS — Z23 Encounter for immunization: Secondary | ICD-10-CM | POA: Diagnosis not present

## 2017-08-01 ENCOUNTER — Encounter (HOSPITAL_COMMUNITY): Payer: Self-pay | Admitting: Emergency Medicine

## 2017-08-01 ENCOUNTER — Emergency Department (HOSPITAL_COMMUNITY)
Admission: EM | Admit: 2017-08-01 | Discharge: 2017-08-01 | Disposition: A | Discharge status: Home / Self Care | Payer: Medicare Other | Attending: Emergency Medicine | Admitting: Emergency Medicine

## 2017-08-01 ENCOUNTER — Other Ambulatory Visit: Payer: Self-pay

## 2017-08-01 ENCOUNTER — Emergency Department (HOSPITAL_COMMUNITY): Payer: Medicare Other

## 2017-08-01 DIAGNOSIS — W1789XA Other fall from one level to another, initial encounter: Secondary | ICD-10-CM | POA: Insufficient documentation

## 2017-08-01 DIAGNOSIS — S0993XA Unspecified injury of face, initial encounter: Secondary | ICD-10-CM | POA: Diagnosis not present

## 2017-08-01 DIAGNOSIS — Y9301 Activity, walking, marching and hiking: Secondary | ICD-10-CM | POA: Insufficient documentation

## 2017-08-01 DIAGNOSIS — F17210 Nicotine dependence, cigarettes, uncomplicated: Secondary | ICD-10-CM | POA: Insufficient documentation

## 2017-08-01 DIAGNOSIS — Y929 Unspecified place or not applicable: Secondary | ICD-10-CM | POA: Diagnosis not present

## 2017-08-01 DIAGNOSIS — Z8673 Personal history of transient ischemic attack (TIA), and cerebral infarction without residual deficits: Secondary | ICD-10-CM | POA: Insufficient documentation

## 2017-08-01 DIAGNOSIS — Y999 Unspecified external cause status: Secondary | ICD-10-CM | POA: Insufficient documentation

## 2017-08-01 DIAGNOSIS — Z79899 Other long term (current) drug therapy: Secondary | ICD-10-CM | POA: Insufficient documentation

## 2017-08-01 DIAGNOSIS — Z7982 Long term (current) use of aspirin: Secondary | ICD-10-CM | POA: Diagnosis not present

## 2017-08-01 DIAGNOSIS — S0083XA Contusion of other part of head, initial encounter: Secondary | ICD-10-CM | POA: Diagnosis not present

## 2017-08-01 DIAGNOSIS — G8911 Acute pain due to trauma: Secondary | ICD-10-CM | POA: Diagnosis not present

## 2017-08-01 DIAGNOSIS — R51 Headache: Secondary | ICD-10-CM | POA: Diagnosis not present

## 2017-08-01 LAB — CBG MONITORING, ED: Glucose-Capillary: 94 mg/dL (ref 65–99)

## 2017-08-01 NOTE — Discharge Instructions (Signed)
Please read instructions below. You can take tylenol every 4-6 hours as needed for headache. Apply ice to your face for 20 minutes at a time to help with swelling and pain. Schedule an appointment with your primary care provider to follow up on your injury. Return to the ER for severely worsening headache, vision changes, fever, weakness or numbness, or new or concerning symptoms.

## 2017-08-01 NOTE — ED Provider Notes (Signed)
[facility] DEPT Provider Note   CSN: [id number] Arrival date & time: 08/01/17  1148     History   Chief Complaint Chief Complaint  Patient presents with  . Fall  . hematoma    HPI Peggy Williams is a 83 y.o. female w PMHx stroke, sitting to the ED status post mechanical fall that occurred prior to arrival.  Patient states she was walking up hill and tripped, falling and hitting the left side of her face.  She notes minimal pain to that area. Denies LOC, headache, vision changes, chest pain, neck or back pain, abdominal pain, nausea, or any other injuries.  States she has difficulty getting words out and also with some right sided weakness that is residual from her stroke.  Takes aspirin daily, denies any other anticoagulation. Patient's family also notes she is at her baseline.  The history is provided by the patient.    Past Medical History:  Diagnosis Date  . Arthritis   . Cataract   . Stroke ([place])    anuerysm  . Thyroid disease     There are no active problems to display for this patient.   Past Surgical History:  Procedure Laterality Date  . ABDOMINAL HYSTERECTOMY    . BRAIN SURGERY    . EYE SURGERY    . JOINT REPLACEMENT      OB History    No data available       Home Medications    Prior to Admission medications   Medication Sig Start Date End Date Taking? Authorizing Provider  aspirin 81 MG tablet Take 81 mg by mouth daily.   Yes [provider]  hydrochlorothiazide (MICROZIDE) 12.5 MG capsule Take 12.5 mg by mouth daily.   Yes [provider]  levothyroxine (SYNTHROID, LEVOTHROID) 75 MCG tablet Take 75 mcg by mouth daily. 06/02/17  Yes [provider]  Multiple Vitamin (MULTIVITAMIN WITH MINERALS) TABS tablet Take 1 tablet by mouth at bedtime.   Yes [provider]  doxycycline (VIBRA-TABS) 100 MG tablet Take 1 tablet (100 mg total) by mouth 2 (two) times daily. Patient not taking:  Reported on 08/01/2017 09/01/13   Le, Max Fickle P, DO  levothyroxine (SYNTHROID, LEVOTHROID) 50 MCG tablet Take 50 mcg by mouth daily before breakfast.    [provider]    Family History History reviewed. No pertinent family history.  Social History Social History   Tobacco Use  . Smoking status: Current Every Day Smoker    Packs/day: 0.25    Years: 65.00    Pack years: 16.25    Types: Cigarettes  . Smokeless tobacco: Never Used  Substance Use Topics  . Alcohol use: No    Frequency: Never  . Drug use: No     Allergies   Patient has no known allergies.   Review of Systems Review of Systems  Eyes: Negative for visual disturbance.  Respiratory: Negative for shortness of breath.   Cardiovascular: Negative for chest pain.  Musculoskeletal: Negative for arthralgias, back pain, myalgias and neck pain.  Skin: Positive for wound.  Neurological: Negative for headaches.  Hematological: Does not bruise/bleed easily.  All other systems reviewed and are negative.    Physical Exam Updated Vital Signs BP (!) 172/94 (BP Location: Left Arm)   Pulse 76   Temp (!) 97.5 F (36.4 C) (Oral)   Resp 20   Ht 5\' 4"  (1.626 m)   Wt 68 kg (150 lb)   SpO2 98%   BMI  25.75 kg/m   Physical Exam  Constitutional: She is oriented to person, place, and time. She appears well-developed and well-nourished. No distress.  Well-appearing.  HENT:  Head: Normocephalic and atraumatic.    Mouth/Throat: Oropharynx is clear and moist.  Contusion with hematoma noted below left eye. Mild tenderness over maxilla/zygomatic bone. No crepitus or obvious deformity. Small contusion noted above L eyebrow without tenderness. No laceration. Superficial abrasion to left chin.  Eyes: Conjunctivae and EOM are normal. Pupils are equal, round, and reactive to light.  Neck: Normal range of motion. Neck supple.  Cardiovascular: Normal rate, regular rhythm, normal heart sounds and intact distal pulses.    Pulmonary/Chest: Effort normal and breath sounds normal. No respiratory distress.  Abdominal: Soft. Bowel sounds are normal.  Musculoskeletal: Normal range of motion. She exhibits no edema, tenderness or deformity.  No Spinal or paraspinal tenderness, no bony step-offs, no gross deformities  Neurological: She is alert and oriented to person, place, and time.  Mental Status:  Alert, oriented, thought content appropriate. Pt having difficulty with word retrieval, though with appropriate content. (for example, it is the month before December)  Able to follow 2 step commands without difficulty.  Cranial Nerves:  II:  Peripheral visual fields grossly normal, pupils equal, round, reactive to light III,IV, VI: ptosis not present, extra-ocular motions intact bilaterally  V,VII: smile symmetric, facial light touch sensation equal VIII: hearing grossly normal to voice  X: uvula elevates symmetrically  XI: bilateral shoulder shrug symmetric and strong XII: midline tongue extension without fassiculations Motor:  Normal tone. Mild right sided weakness in upper and lower extremities s/p stroke.  Sensory: Pinprick and light touch normal in all extremities.  Deep Tendon Reflexes: 2+ and symmetric in the biceps and patella Cerebellar: normal finger-to-nose with bilateral upper extremities Gait: normal gait and balance CV: distal pulses palpable throughout    Skin: Skin is warm.  Psychiatric: She has a normal mood and affect. Her behavior is normal.  Nursing note and vitals reviewed.    ED Treatments / Results  Labs (all labs ordered are listed, but only abnormal results are displayed) Labs Reviewed  CBG MONITORING, ED  CBG MONITORING, ED    EKG  EKG Interpretation  Date/Time:  Wednesday August 01 2017 12:25:11 EST Ventricular Rate:  78 PR Interval:    QRS Duration: 101 QT Interval:  395 QTC Calculation: 450 R Axis:   -16 Text Interpretation:  Sinus rhythm Borderline left axis  deviation Confirmed by Virgel Manifold [phone]) on 08/01/2017 1:29:14 PM       Radiology Ct Maxillofacial Wo Cm  Result Date: 08/01/2017 CLINICAL DATA:  Fall today while walking. Left facial bruising. History of cerebral aneurysm and CVA. EXAM: CT MAXILLOFACIAL WITHOUT CONTRAST TECHNIQUE: Multidetector CT imaging of the maxillofacial structures was performed. Multiplanar CT image reconstructions were also generated. COMPARISON:  None. FINDINGS: Osseous: No evidence of acute facial fracture there are advanced temporomandibular joint degenerative changes bilaterally. Left temporal craniotomy noted. There are prominent degenerative changes in the visualized cervical spine, especially at C1-2 with and C5-6. Orbits: The globes are intact.  No evidence of orbital hematoma. Sinuses: The paranasal sinuses, mastoid air cells and middle ears are clear without air-fluid levels. Soft tissues: There is prominent soft tissue swelling in the left infraorbital region with a 1.8 cm hematoma anterior to the left maxillary sinus. 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 Intracranial vascular calcifications and atrophy in the left temporal lobe are noted. IMPRESSION: 1. Left infraorbital soft  tissue swelling with small focal subcutaneous hematoma. No evidence of orbital hematoma. 2. No evidence of facial fracture. 3. Postsurgical changes from previous craniotomy and parasellar aneurysm clipping. Electronically Signed   By: Richardean Sale M.D.   On: 08/01/2017 14:26    Procedures Procedures (including critical care time)  Medications Ordered in ED Medications - No data to display   Initial Impression / Assessment and Plan / ED Course  I have reviewed the triage vital signs and the nursing notes.  Pertinent labs & imaging results that were available during my care of the patient were reviewed by me and considered in my medical decision making (see chart for details).     Pt presenting with  facial contusion status post mechanical fall.  Not on anticoagulation.  Denies headache, vision changes, neck pain, back pain, or any other injuries or complaints.  No neuro deficits on exam, with exception of residual symptoms s/p stroke. CT maxillofacial without evidence of facial fracture or orbital hematoma; findings consistent with soft tissue hematoma. Pt without complaints in ED. Well-appearing, not in distress. Safe for discharge home with close PCP follow up.  Patient discussed with and seen by Dr. Wilson Singer, who agrees with care plan.  Discussed results, findings, treatment and follow up. Patient advised of return precautions. Patient verbalized understanding and agreed with plan.   Final Clinical Impressions(s) / ED Diagnoses   Final diagnoses:  Contusion of face, initial encounter    ED Discharge Orders    None       Liandra Mendia, [place] N, PA-C 08/01/17 1624    Virgel Manifold, MD 08/02/17 [phone]

## 2017-08-01 NOTE — ED Notes (Addendum)
Pt is alert and oriented x 4 and is verbally responsive. Pt denies any pain at this . Pt  Reports that she was walking up a hill , pt doesnt recall tripping. Pt reports that she did her her head but no LOC. Pt has a contusion to left cheek, and an abrasion to left side of chin. +PERRLA

## 2017-08-01 NOTE — ED Triage Notes (Signed)
Pt presents via EMS from [facility]. Pt was going walk for a walk and tripped and fell and hit left side of face and has a bump under left eye. Pt denies pain per EMS. Pt is alert and oriented x 4 . Pt has some deficits with talking post stroke in the 80's. Normal for pt baseline.   EMS/ VS:  BP 168/104, HR 90, O2 93% CBG 138.

## 2017-08-01 NOTE — ED Triage Notes (Signed)
Patient from [facility] BIB [facility] for fall. Patient has hematoma to left side of face. Denies pain or LOC when fell. patient takes ASA daily.

## 2017-08-10 DIAGNOSIS — E039 Hypothyroidism, unspecified: Secondary | ICD-10-CM | POA: Diagnosis not present

## 2017-09-26 DIAGNOSIS — R269 Unspecified abnormalities of gait and mobility: Secondary | ICD-10-CM | POA: Diagnosis not present

## 2017-09-26 DIAGNOSIS — R41841 Cognitive communication deficit: Secondary | ICD-10-CM | POA: Diagnosis not present

## 2017-09-26 DIAGNOSIS — M6281 Muscle weakness (generalized): Secondary | ICD-10-CM | POA: Diagnosis not present

## 2017-09-26 DIAGNOSIS — R4182 Altered mental status, unspecified: Secondary | ICD-10-CM | POA: Diagnosis not present

## 2017-10-08 DIAGNOSIS — R269 Unspecified abnormalities of gait and mobility: Secondary | ICD-10-CM | POA: Diagnosis not present

## 2017-10-08 DIAGNOSIS — N183 Chronic kidney disease, stage 3 (moderate): Secondary | ICD-10-CM | POA: Diagnosis not present

## 2017-10-08 DIAGNOSIS — D692 Other nonthrombocytopenic purpura: Secondary | ICD-10-CM | POA: Diagnosis not present

## 2017-10-08 DIAGNOSIS — M6281 Muscle weakness (generalized): Secondary | ICD-10-CM | POA: Diagnosis not present

## 2017-10-08 DIAGNOSIS — R41841 Cognitive communication deficit: Secondary | ICD-10-CM | POA: Diagnosis not present

## 2017-10-08 DIAGNOSIS — I129 Hypertensive chronic kidney disease with stage 1 through stage 4 chronic kidney disease, or unspecified chronic kidney disease: Secondary | ICD-10-CM | POA: Diagnosis not present

## 2017-10-08 DIAGNOSIS — Z79899 Other long term (current) drug therapy: Secondary | ICD-10-CM | POA: Diagnosis not present

## 2017-10-08 DIAGNOSIS — R4182 Altered mental status, unspecified: Secondary | ICD-10-CM | POA: Diagnosis not present

## 2017-10-10 DIAGNOSIS — M6281 Muscle weakness (generalized): Secondary | ICD-10-CM | POA: Diagnosis not present

## 2017-10-10 DIAGNOSIS — R41841 Cognitive communication deficit: Secondary | ICD-10-CM | POA: Diagnosis not present

## 2017-10-10 DIAGNOSIS — R4182 Altered mental status, unspecified: Secondary | ICD-10-CM | POA: Diagnosis not present

## 2017-10-10 DIAGNOSIS — R269 Unspecified abnormalities of gait and mobility: Secondary | ICD-10-CM | POA: Diagnosis not present

## 2017-10-15 DIAGNOSIS — M6281 Muscle weakness (generalized): Secondary | ICD-10-CM | POA: Diagnosis not present

## 2017-10-15 DIAGNOSIS — R41841 Cognitive communication deficit: Secondary | ICD-10-CM | POA: Diagnosis not present

## 2017-10-15 DIAGNOSIS — R4182 Altered mental status, unspecified: Secondary | ICD-10-CM | POA: Diagnosis not present

## 2017-10-15 DIAGNOSIS — R269 Unspecified abnormalities of gait and mobility: Secondary | ICD-10-CM | POA: Diagnosis not present

## 2017-10-17 DIAGNOSIS — H353132 Nonexudative age-related macular degeneration, bilateral, intermediate dry stage: Secondary | ICD-10-CM | POA: Diagnosis not present

## 2017-10-17 DIAGNOSIS — H11823 Conjunctivochalasis, bilateral: Secondary | ICD-10-CM | POA: Diagnosis not present

## 2017-10-17 DIAGNOSIS — Z961 Presence of intraocular lens: Secondary | ICD-10-CM | POA: Diagnosis not present

## 2017-10-17 DIAGNOSIS — H26492 Other secondary cataract, left eye: Secondary | ICD-10-CM | POA: Diagnosis not present

## 2017-10-19 DIAGNOSIS — R269 Unspecified abnormalities of gait and mobility: Secondary | ICD-10-CM | POA: Diagnosis not present

## 2017-10-19 DIAGNOSIS — M6281 Muscle weakness (generalized): Secondary | ICD-10-CM | POA: Diagnosis not present

## 2017-10-19 DIAGNOSIS — R4182 Altered mental status, unspecified: Secondary | ICD-10-CM | POA: Diagnosis not present

## 2017-10-19 DIAGNOSIS — R41841 Cognitive communication deficit: Secondary | ICD-10-CM | POA: Diagnosis not present

## 2017-10-22 DIAGNOSIS — R4182 Altered mental status, unspecified: Secondary | ICD-10-CM | POA: Diagnosis not present

## 2017-10-22 DIAGNOSIS — R41841 Cognitive communication deficit: Secondary | ICD-10-CM | POA: Diagnosis not present

## 2017-10-22 DIAGNOSIS — M6281 Muscle weakness (generalized): Secondary | ICD-10-CM | POA: Diagnosis not present

## 2017-10-22 DIAGNOSIS — R269 Unspecified abnormalities of gait and mobility: Secondary | ICD-10-CM | POA: Diagnosis not present

## 2017-10-24 DIAGNOSIS — M6281 Muscle weakness (generalized): Secondary | ICD-10-CM | POA: Diagnosis not present

## 2017-10-24 DIAGNOSIS — R41841 Cognitive communication deficit: Secondary | ICD-10-CM | POA: Diagnosis not present

## 2017-10-24 DIAGNOSIS — R4182 Altered mental status, unspecified: Secondary | ICD-10-CM | POA: Diagnosis not present

## 2017-10-24 DIAGNOSIS — R269 Unspecified abnormalities of gait and mobility: Secondary | ICD-10-CM | POA: Diagnosis not present

## 2017-10-29 DIAGNOSIS — R269 Unspecified abnormalities of gait and mobility: Secondary | ICD-10-CM | POA: Diagnosis not present

## 2017-10-29 DIAGNOSIS — M6281 Muscle weakness (generalized): Secondary | ICD-10-CM | POA: Diagnosis not present

## 2017-10-29 DIAGNOSIS — R41841 Cognitive communication deficit: Secondary | ICD-10-CM | POA: Diagnosis not present

## 2017-10-29 DIAGNOSIS — R4182 Altered mental status, unspecified: Secondary | ICD-10-CM | POA: Diagnosis not present

## 2017-10-31 DIAGNOSIS — M6281 Muscle weakness (generalized): Secondary | ICD-10-CM | POA: Diagnosis not present

## 2017-10-31 DIAGNOSIS — R41841 Cognitive communication deficit: Secondary | ICD-10-CM | POA: Diagnosis not present

## 2017-10-31 DIAGNOSIS — R269 Unspecified abnormalities of gait and mobility: Secondary | ICD-10-CM | POA: Diagnosis not present

## 2017-10-31 DIAGNOSIS — R4182 Altered mental status, unspecified: Secondary | ICD-10-CM | POA: Diagnosis not present

## 2017-11-05 DIAGNOSIS — R41841 Cognitive communication deficit: Secondary | ICD-10-CM | POA: Diagnosis not present

## 2017-11-05 DIAGNOSIS — R4182 Altered mental status, unspecified: Secondary | ICD-10-CM | POA: Diagnosis not present

## 2017-11-05 DIAGNOSIS — M6281 Muscle weakness (generalized): Secondary | ICD-10-CM | POA: Diagnosis not present

## 2017-11-05 DIAGNOSIS — R269 Unspecified abnormalities of gait and mobility: Secondary | ICD-10-CM | POA: Diagnosis not present

## 2017-11-07 DIAGNOSIS — R4182 Altered mental status, unspecified: Secondary | ICD-10-CM | POA: Diagnosis not present

## 2017-11-07 DIAGNOSIS — R41841 Cognitive communication deficit: Secondary | ICD-10-CM | POA: Diagnosis not present

## 2017-11-07 DIAGNOSIS — M6281 Muscle weakness (generalized): Secondary | ICD-10-CM | POA: Diagnosis not present

## 2017-11-07 DIAGNOSIS — R269 Unspecified abnormalities of gait and mobility: Secondary | ICD-10-CM | POA: Diagnosis not present

## 2017-11-12 DIAGNOSIS — R41841 Cognitive communication deficit: Secondary | ICD-10-CM | POA: Diagnosis not present

## 2017-11-12 DIAGNOSIS — R269 Unspecified abnormalities of gait and mobility: Secondary | ICD-10-CM | POA: Diagnosis not present

## 2017-11-12 DIAGNOSIS — M6281 Muscle weakness (generalized): Secondary | ICD-10-CM | POA: Diagnosis not present

## 2017-11-12 DIAGNOSIS — R4182 Altered mental status, unspecified: Secondary | ICD-10-CM | POA: Diagnosis not present

## 2017-11-14 DIAGNOSIS — R269 Unspecified abnormalities of gait and mobility: Secondary | ICD-10-CM | POA: Diagnosis not present

## 2017-11-14 DIAGNOSIS — R4182 Altered mental status, unspecified: Secondary | ICD-10-CM | POA: Diagnosis not present

## 2017-11-14 DIAGNOSIS — M6281 Muscle weakness (generalized): Secondary | ICD-10-CM | POA: Diagnosis not present

## 2017-11-14 DIAGNOSIS — R41841 Cognitive communication deficit: Secondary | ICD-10-CM | POA: Diagnosis not present

## 2017-11-19 DIAGNOSIS — H11822 Conjunctivochalasis, left eye: Secondary | ICD-10-CM | POA: Diagnosis not present

## 2017-11-19 DIAGNOSIS — H11821 Conjunctivochalasis, right eye: Secondary | ICD-10-CM | POA: Diagnosis not present

## 2017-11-21 DIAGNOSIS — R4182 Altered mental status, unspecified: Secondary | ICD-10-CM | POA: Diagnosis not present

## 2017-11-21 DIAGNOSIS — R269 Unspecified abnormalities of gait and mobility: Secondary | ICD-10-CM | POA: Diagnosis not present

## 2017-11-21 DIAGNOSIS — M6281 Muscle weakness (generalized): Secondary | ICD-10-CM | POA: Diagnosis not present

## 2017-11-21 DIAGNOSIS — R41841 Cognitive communication deficit: Secondary | ICD-10-CM | POA: Diagnosis not present

## 2017-11-23 DIAGNOSIS — R41841 Cognitive communication deficit: Secondary | ICD-10-CM | POA: Diagnosis not present

## 2017-11-23 DIAGNOSIS — R4182 Altered mental status, unspecified: Secondary | ICD-10-CM | POA: Diagnosis not present

## 2017-11-23 DIAGNOSIS — M6281 Muscle weakness (generalized): Secondary | ICD-10-CM | POA: Diagnosis not present

## 2017-11-23 DIAGNOSIS — R269 Unspecified abnormalities of gait and mobility: Secondary | ICD-10-CM | POA: Diagnosis not present

## 2017-11-26 DIAGNOSIS — R41841 Cognitive communication deficit: Secondary | ICD-10-CM | POA: Diagnosis not present

## 2017-11-26 DIAGNOSIS — M6281 Muscle weakness (generalized): Secondary | ICD-10-CM | POA: Diagnosis not present

## 2017-11-26 DIAGNOSIS — R4182 Altered mental status, unspecified: Secondary | ICD-10-CM | POA: Diagnosis not present

## 2017-11-26 DIAGNOSIS — R269 Unspecified abnormalities of gait and mobility: Secondary | ICD-10-CM | POA: Diagnosis not present

## 2017-11-28 DIAGNOSIS — R4182 Altered mental status, unspecified: Secondary | ICD-10-CM | POA: Diagnosis not present

## 2017-11-28 DIAGNOSIS — M6281 Muscle weakness (generalized): Secondary | ICD-10-CM | POA: Diagnosis not present

## 2017-11-28 DIAGNOSIS — R269 Unspecified abnormalities of gait and mobility: Secondary | ICD-10-CM | POA: Diagnosis not present

## 2017-11-28 DIAGNOSIS — R41841 Cognitive communication deficit: Secondary | ICD-10-CM | POA: Diagnosis not present

## 2017-12-25 DIAGNOSIS — Z85828 Personal history of other malignant neoplasm of skin: Secondary | ICD-10-CM | POA: Diagnosis not present

## 2017-12-25 DIAGNOSIS — L821 Other seborrheic keratosis: Secondary | ICD-10-CM | POA: Diagnosis not present

## 2017-12-25 DIAGNOSIS — L814 Other melanin hyperpigmentation: Secondary | ICD-10-CM | POA: Diagnosis not present

## 2018-02-06 DIAGNOSIS — H10413 Chronic giant papillary conjunctivitis, bilateral: Secondary | ICD-10-CM | POA: Diagnosis not present

## 2018-04-09 DIAGNOSIS — Z1389 Encounter for screening for other disorder: Secondary | ICD-10-CM | POA: Diagnosis not present

## 2018-04-09 DIAGNOSIS — I129 Hypertensive chronic kidney disease with stage 1 through stage 4 chronic kidney disease, or unspecified chronic kidney disease: Secondary | ICD-10-CM | POA: Diagnosis not present

## 2018-04-09 DIAGNOSIS — M85851 Other specified disorders of bone density and structure, right thigh: Secondary | ICD-10-CM | POA: Diagnosis not present

## 2018-04-09 DIAGNOSIS — Z13820 Encounter for screening for osteoporosis: Secondary | ICD-10-CM | POA: Diagnosis not present

## 2018-04-09 DIAGNOSIS — Z79899 Other long term (current) drug therapy: Secondary | ICD-10-CM | POA: Diagnosis not present

## 2018-04-09 DIAGNOSIS — N183 Chronic kidney disease, stage 3 (moderate): Secondary | ICD-10-CM | POA: Diagnosis not present

## 2018-04-09 DIAGNOSIS — Z0000 Encounter for general adult medical examination without abnormal findings: Secondary | ICD-10-CM | POA: Diagnosis not present

## 2018-04-09 DIAGNOSIS — M85852 Other specified disorders of bone density and structure, left thigh: Secondary | ICD-10-CM | POA: Diagnosis not present

## 2018-06-10 DIAGNOSIS — M8588 Other specified disorders of bone density and structure, other site: Secondary | ICD-10-CM | POA: Diagnosis not present

## 2018-07-09 DIAGNOSIS — Z23 Encounter for immunization: Secondary | ICD-10-CM | POA: Diagnosis not present

## 2018-10-11 DIAGNOSIS — D692 Other nonthrombocytopenic purpura: Secondary | ICD-10-CM | POA: Diagnosis not present

## 2018-10-11 DIAGNOSIS — N183 Chronic kidney disease, stage 3 (moderate): Secondary | ICD-10-CM | POA: Diagnosis not present

## 2018-10-11 DIAGNOSIS — I129 Hypertensive chronic kidney disease with stage 1 through stage 4 chronic kidney disease, or unspecified chronic kidney disease: Secondary | ICD-10-CM | POA: Diagnosis not present

## 2018-11-11 DIAGNOSIS — H353132 Nonexudative age-related macular degeneration, bilateral, intermediate dry stage: Secondary | ICD-10-CM | POA: Diagnosis not present

## 2018-11-11 DIAGNOSIS — H40023 Open angle with borderline findings, high risk, bilateral: Secondary | ICD-10-CM | POA: Diagnosis not present

## 2018-11-11 DIAGNOSIS — H26492 Other secondary cataract, left eye: Secondary | ICD-10-CM | POA: Diagnosis not present

## 2018-11-11 DIAGNOSIS — Z961 Presence of intraocular lens: Secondary | ICD-10-CM | POA: Diagnosis not present

## 2019-06-05 DIAGNOSIS — I129 Hypertensive chronic kidney disease with stage 1 through stage 4 chronic kidney disease, or unspecified chronic kidney disease: Secondary | ICD-10-CM | POA: Diagnosis not present

## 2019-06-05 DIAGNOSIS — N183 Chronic kidney disease, stage 3 (moderate): Secondary | ICD-10-CM | POA: Diagnosis not present

## 2019-06-05 DIAGNOSIS — D692 Other nonthrombocytopenic purpura: Secondary | ICD-10-CM | POA: Diagnosis not present

## 2019-06-05 DIAGNOSIS — Z79899 Other long term (current) drug therapy: Secondary | ICD-10-CM | POA: Diagnosis not present

## 2019-06-05 DIAGNOSIS — E039 Hypothyroidism, unspecified: Secondary | ICD-10-CM | POA: Diagnosis not present

## 2019-06-05 DIAGNOSIS — E7800 Pure hypercholesterolemia, unspecified: Secondary | ICD-10-CM | POA: Diagnosis not present

## 2019-06-05 DIAGNOSIS — Z0000 Encounter for general adult medical examination without abnormal findings: Secondary | ICD-10-CM | POA: Diagnosis not present

## 2019-06-05 DIAGNOSIS — Z1389 Encounter for screening for other disorder: Secondary | ICD-10-CM | POA: Diagnosis not present

## 2019-06-24 DIAGNOSIS — Z23 Encounter for immunization: Secondary | ICD-10-CM | POA: Diagnosis not present

## 2019-11-25 DIAGNOSIS — L821 Other seborrheic keratosis: Secondary | ICD-10-CM | POA: Diagnosis not present

## 2019-11-25 DIAGNOSIS — Z85828 Personal history of other malignant neoplasm of skin: Secondary | ICD-10-CM | POA: Diagnosis not present

## 2019-11-25 DIAGNOSIS — L814 Other melanin hyperpigmentation: Secondary | ICD-10-CM | POA: Diagnosis not present

## 2019-11-25 DIAGNOSIS — L570 Actinic keratosis: Secondary | ICD-10-CM | POA: Diagnosis not present

## 2019-11-25 DIAGNOSIS — L820 Inflamed seborrheic keratosis: Secondary | ICD-10-CM | POA: Diagnosis not present

## 2019-12-04 DIAGNOSIS — I129 Hypertensive chronic kidney disease with stage 1 through stage 4 chronic kidney disease, or unspecified chronic kidney disease: Secondary | ICD-10-CM | POA: Diagnosis not present

## 2019-12-30 DIAGNOSIS — Z85828 Personal history of other malignant neoplasm of skin: Secondary | ICD-10-CM | POA: Diagnosis not present

## 2019-12-30 DIAGNOSIS — L821 Other seborrheic keratosis: Secondary | ICD-10-CM | POA: Diagnosis not present

## 2019-12-30 DIAGNOSIS — L570 Actinic keratosis: Secondary | ICD-10-CM | POA: Diagnosis not present

## 2019-12-30 DIAGNOSIS — L814 Other melanin hyperpigmentation: Secondary | ICD-10-CM | POA: Diagnosis not present

## 2019-12-30 DIAGNOSIS — D229 Melanocytic nevi, unspecified: Secondary | ICD-10-CM | POA: Diagnosis not present

## 2020-02-24 DIAGNOSIS — L570 Actinic keratosis: Secondary | ICD-10-CM | POA: Diagnosis not present

## 2020-02-24 DIAGNOSIS — D1801 Hemangioma of skin and subcutaneous tissue: Secondary | ICD-10-CM | POA: Diagnosis not present

## 2020-02-24 DIAGNOSIS — L821 Other seborrheic keratosis: Secondary | ICD-10-CM | POA: Diagnosis not present

## 2020-02-24 DIAGNOSIS — L814 Other melanin hyperpigmentation: Secondary | ICD-10-CM | POA: Diagnosis not present

## 2020-02-24 DIAGNOSIS — Z85828 Personal history of other malignant neoplasm of skin: Secondary | ICD-10-CM | POA: Diagnosis not present

## 2020-03-30 DIAGNOSIS — L814 Other melanin hyperpigmentation: Secondary | ICD-10-CM | POA: Diagnosis not present

## 2020-03-30 DIAGNOSIS — D1801 Hemangioma of skin and subcutaneous tissue: Secondary | ICD-10-CM | POA: Diagnosis not present

## 2020-03-30 DIAGNOSIS — L570 Actinic keratosis: Secondary | ICD-10-CM | POA: Diagnosis not present

## 2020-03-30 DIAGNOSIS — D2262 Melanocytic nevi of left upper limb, including shoulder: Secondary | ICD-10-CM | POA: Diagnosis not present

## 2020-05-04 DIAGNOSIS — Z85828 Personal history of other malignant neoplasm of skin: Secondary | ICD-10-CM | POA: Diagnosis not present

## 2020-05-04 DIAGNOSIS — L570 Actinic keratosis: Secondary | ICD-10-CM | POA: Diagnosis not present

## 2020-05-04 DIAGNOSIS — L814 Other melanin hyperpigmentation: Secondary | ICD-10-CM | POA: Diagnosis not present

## 2020-06-18 DIAGNOSIS — Z23 Encounter for immunization: Secondary | ICD-10-CM | POA: Diagnosis not present

## 2020-06-24 DIAGNOSIS — Z1389 Encounter for screening for other disorder: Secondary | ICD-10-CM | POA: Diagnosis not present

## 2020-06-24 DIAGNOSIS — I10 Essential (primary) hypertension: Secondary | ICD-10-CM | POA: Diagnosis not present

## 2020-06-24 DIAGNOSIS — E039 Hypothyroidism, unspecified: Secondary | ICD-10-CM | POA: Diagnosis not present

## 2020-06-24 DIAGNOSIS — Z79899 Other long term (current) drug therapy: Secondary | ICD-10-CM | POA: Diagnosis not present

## 2020-06-24 DIAGNOSIS — Z0000 Encounter for general adult medical examination without abnormal findings: Secondary | ICD-10-CM | POA: Diagnosis not present

## 2020-07-17 ENCOUNTER — Other Ambulatory Visit: Payer: Self-pay

## 2020-07-17 ENCOUNTER — Inpatient Hospital Stay (HOSPITAL_COMMUNITY)
Admission: EM | Admit: 2020-07-17 | Discharge: 2020-07-19 | DRG: 309 | Disposition: A | Discharge status: Home / Self Care | Payer: Medicare Other | Attending: Internal Medicine | Admitting: Internal Medicine

## 2020-07-17 ENCOUNTER — Emergency Department (HOSPITAL_COMMUNITY): Payer: Medicare Other

## 2020-07-17 ENCOUNTER — Inpatient Hospital Stay (HOSPITAL_COMMUNITY): Payer: Medicare Other

## 2020-07-17 ENCOUNTER — Encounter (HOSPITAL_COMMUNITY): Payer: Self-pay | Admitting: Internal Medicine

## 2020-07-17 DIAGNOSIS — H9190 Unspecified hearing loss, unspecified ear: Secondary | ICD-10-CM | POA: Diagnosis present

## 2020-07-17 DIAGNOSIS — Z7982 Long term (current) use of aspirin: Secondary | ICD-10-CM

## 2020-07-17 DIAGNOSIS — I340 Nonrheumatic mitral (valve) insufficiency: Secondary | ICD-10-CM | POA: Diagnosis not present

## 2020-07-17 DIAGNOSIS — Z20822 Contact with and (suspected) exposure to covid-19: Secondary | ICD-10-CM | POA: Diagnosis present

## 2020-07-17 DIAGNOSIS — I499 Cardiac arrhythmia, unspecified: Secondary | ICD-10-CM | POA: Diagnosis not present

## 2020-07-17 DIAGNOSIS — M549 Dorsalgia, unspecified: Secondary | ICD-10-CM | POA: Diagnosis not present

## 2020-07-17 DIAGNOSIS — F17210 Nicotine dependence, cigarettes, uncomplicated: Secondary | ICD-10-CM | POA: Diagnosis present

## 2020-07-17 DIAGNOSIS — I361 Nonrheumatic tricuspid (valve) insufficiency: Secondary | ICD-10-CM | POA: Diagnosis not present

## 2020-07-17 DIAGNOSIS — I4892 Unspecified atrial flutter: Principal | ICD-10-CM | POA: Diagnosis present

## 2020-07-17 DIAGNOSIS — E039 Hypothyroidism, unspecified: Secondary | ICD-10-CM | POA: Diagnosis present

## 2020-07-17 DIAGNOSIS — R0902 Hypoxemia: Secondary | ICD-10-CM | POA: Diagnosis present

## 2020-07-17 DIAGNOSIS — I10 Essential (primary) hypertension: Secondary | ICD-10-CM | POA: Diagnosis not present

## 2020-07-17 DIAGNOSIS — M545 Low back pain, unspecified: Secondary | ICD-10-CM | POA: Diagnosis not present

## 2020-07-17 DIAGNOSIS — I248 Other forms of acute ischemic heart disease: Secondary | ICD-10-CM | POA: Diagnosis present

## 2020-07-17 DIAGNOSIS — Y929 Unspecified place or not applicable: Secondary | ICD-10-CM | POA: Diagnosis not present

## 2020-07-17 DIAGNOSIS — Z79899 Other long term (current) drug therapy: Secondary | ICD-10-CM | POA: Diagnosis not present

## 2020-07-17 DIAGNOSIS — M546 Pain in thoracic spine: Secondary | ICD-10-CM | POA: Diagnosis present

## 2020-07-17 DIAGNOSIS — J189 Pneumonia, unspecified organism: Secondary | ICD-10-CM

## 2020-07-17 DIAGNOSIS — Z66 Do not resuscitate: Secondary | ICD-10-CM | POA: Diagnosis present

## 2020-07-17 DIAGNOSIS — R778 Other specified abnormalities of plasma proteins: Secondary | ICD-10-CM

## 2020-07-17 DIAGNOSIS — R911 Solitary pulmonary nodule: Secondary | ICD-10-CM | POA: Diagnosis not present

## 2020-07-17 DIAGNOSIS — Z8673 Personal history of transient ischemic attack (TIA), and cerebral infarction without residual deficits: Secondary | ICD-10-CM

## 2020-07-17 DIAGNOSIS — I4891 Unspecified atrial fibrillation: Secondary | ICD-10-CM | POA: Diagnosis not present

## 2020-07-17 DIAGNOSIS — B029 Zoster without complications: Secondary | ICD-10-CM | POA: Diagnosis present

## 2020-07-17 DIAGNOSIS — B028 Zoster with other complications: Secondary | ICD-10-CM

## 2020-07-17 DIAGNOSIS — R6889 Other general symptoms and signs: Secondary | ICD-10-CM | POA: Diagnosis not present

## 2020-07-17 DIAGNOSIS — I69951 Hemiplegia and hemiparesis following unspecified cerebrovascular disease affecting right dominant side: Secondary | ICD-10-CM

## 2020-07-17 DIAGNOSIS — T80818A Extravasation of other vesicant agent, initial encounter: Secondary | ICD-10-CM | POA: Diagnosis present

## 2020-07-17 DIAGNOSIS — Z79890 Hormone replacement therapy (postmenopausal): Secondary | ICD-10-CM | POA: Diagnosis not present

## 2020-07-17 DIAGNOSIS — R000 Tachycardia, unspecified: Secondary | ICD-10-CM

## 2020-07-17 DIAGNOSIS — I2510 Atherosclerotic heart disease of native coronary artery without angina pectoris: Secondary | ICD-10-CM | POA: Diagnosis present

## 2020-07-17 DIAGNOSIS — Z743 Need for continuous supervision: Secondary | ICD-10-CM | POA: Diagnosis not present

## 2020-07-17 DIAGNOSIS — R7989 Other specified abnormal findings of blood chemistry: Secondary | ICD-10-CM | POA: Diagnosis not present

## 2020-07-17 LAB — COMPREHENSIVE METABOLIC PANEL
ALT: 19 U/L (ref 0–44)
AST: 22 U/L (ref 15–41)
Albumin: 3.6 g/dL (ref 3.5–5.0)
Alkaline Phosphatase: 53 U/L (ref 38–126)
Anion gap: 10 (ref 5–15)
BUN: 12 mg/dL (ref 8–23)
CO2: 28 mmol/L (ref 22–32)
Calcium: 9.5 mg/dL (ref 8.9–10.3)
Chloride: 104 mmol/L (ref 98–111)
Creatinine, Ser: 1.08 mg/dL — ABNORMAL HIGH (ref 0.44–1.00)
GFR, Estimated: 49 mL/min — ABNORMAL LOW (ref >60)
Glucose, Bld: 112 mg/dL — ABNORMAL HIGH (ref 70–99)
Potassium: 5 mmol/L (ref 3.5–5.1)
Sodium: 142 mmol/L (ref 135–145)
Total Bilirubin: 0.8 mg/dL (ref 0.3–1.2)
Total Protein: 6.6 g/dL (ref 6.5–8.1)

## 2020-07-17 LAB — CBC WITH DIFFERENTIAL/PLATELET
Abs Immature Granulocytes: 0.03 10*3/uL (ref 0.00–0.07)
Basophils Absolute: 0.1 10*3/uL (ref 0.0–0.1)
Basophils Relative: 1 %
Eosinophils Absolute: 0.2 10*3/uL (ref 0.0–0.5)
Eosinophils Relative: 2 %
HCT: 48.4 % — ABNORMAL HIGH (ref 36.0–46.0)
Hemoglobin: 15.7 g/dL — ABNORMAL HIGH (ref 12.0–15.0)
Immature Granulocytes: 0 %
Lymphocytes Relative: 21 %
Lymphs Abs: 2 10*3/uL (ref 0.7–4.0)
MCH: 31.4 pg (ref 26.0–34.0)
MCHC: 32.4 g/dL (ref 30.0–36.0)
MCV: 96.8 fL (ref 80.0–100.0)
Monocytes Absolute: 0.7 10*3/uL (ref 0.1–1.0)
Monocytes Relative: 8 %
Neutro Abs: 6.7 10*3/uL (ref 1.7–7.7)
Neutrophils Relative %: 68 %
Platelets: 252 10*3/uL (ref 150–400)
RBC: 5 MIL/uL (ref 3.87–5.11)
RDW: 14.4 % (ref 11.5–15.5)
WBC: 9.8 10*3/uL (ref 4.0–10.5)
nRBC: 0 % (ref 0.0–0.2)

## 2020-07-17 LAB — RESPIRATORY PANEL BY RT PCR (FLU A&B, COVID)
Influenza A by PCR: NEGATIVE
Influenza B by PCR: NEGATIVE
SARS Coronavirus 2 by RT PCR: NEGATIVE

## 2020-07-17 LAB — TROPONIN I (HIGH SENSITIVITY)
Troponin I (High Sensitivity): 10 ng/L (ref <18)
Troponin I (High Sensitivity): 35 ng/L — ABNORMAL HIGH (ref <18)
Troponin I (High Sensitivity): 51 ng/L — ABNORMAL HIGH (ref <18)
Troponin I (High Sensitivity): 35 ng/L — ABNORMAL HIGH (ref <18)

## 2020-07-17 LAB — TSH: TSH: 1.595 u[IU]/mL (ref 0.350–4.500)

## 2020-07-17 LAB — LIPASE, BLOOD: Lipase: 46 U/L (ref 11–51)

## 2020-07-17 LAB — LACTIC ACID, PLASMA: Lactic Acid, Venous: 1.3 mmol/L (ref 0.5–1.9)

## 2020-07-17 MED ORDER — ACETAMINOPHEN 325 MG PO TABS: 650.0000 mg | ORAL_TABLET | ORAL | Status: DC | PRN

## 2020-07-17 MED ORDER — SODIUM CHLORIDE 0.9 % IV BOLUS
500.0000 mL | Freq: Once | INTRAVENOUS | Status: AC
Start: 2020-07-17 — End: 2020-07-17
  Administered 2020-07-17: 500 mL via INTRAVENOUS

## 2020-07-17 MED ORDER — LEVOTHYROXINE SODIUM 50 MCG PO TABS
50.0000 ug | ORAL_TABLET | Freq: Every day | ORAL | Status: DC
  Administered 2020-07-18: 50 ug via ORAL
  Filled 2020-07-17 (×2): qty 1

## 2020-07-17 MED ORDER — SODIUM CHLORIDE 0.9 % IV SOLN
500.0000 mg | Freq: Once | INTRAVENOUS | Status: AC
  Administered 2020-07-17: 500 mg via INTRAVENOUS
  Filled 2020-07-17: qty 500

## 2020-07-17 MED ORDER — OXYCODONE HCL 5 MG PO TABS
5.0000 mg | ORAL_TABLET | Freq: Four times a day (QID) | ORAL | Status: DC | PRN
  Administered 2020-07-18 – 2020-07-19 (×3): 5 mg via ORAL
  Filled 2020-07-17 (×3): qty 1

## 2020-07-17 MED ORDER — SODIUM CHLORIDE 0.9 % IV SOLN
1.0000 g | Freq: Once | INTRAVENOUS | Status: AC
  Administered 2020-07-17: 1 g via INTRAVENOUS
  Filled 2020-07-17: qty 10

## 2020-07-17 MED ORDER — ONDANSETRON HCL 4 MG/2ML IJ SOLN: 4.0000 mg | Freq: Four times a day (QID) | INTRAMUSCULAR | Status: DC | PRN

## 2020-07-17 MED ORDER — FUROSEMIDE 10 MG/ML IJ SOLN
20.0000 mg | Freq: Once | INTRAMUSCULAR | Status: AC
  Administered 2020-07-17: 20 mg via INTRAVENOUS
  Filled 2020-07-17: qty 2

## 2020-07-17 MED ORDER — IOHEXOL 350 MG/ML SOLN
75.0000 mL | Freq: Once | INTRAVENOUS | Status: AC | PRN
  Administered 2020-07-17: 75 mL via INTRAVENOUS

## 2020-07-17 MED ORDER — DILTIAZEM HCL-DEXTROSE 125-5 MG/125ML-% IV SOLN (PREMIX)
5.0000 mg/h | INTRAVENOUS | Status: DC
  Administered 2020-07-17: 10 mg/h via INTRAVENOUS
  Administered 2020-07-17: 5 mg/h via INTRAVENOUS
  Administered 2020-07-18: 10 mg/h via INTRAVENOUS
  Filled 2020-07-17 (×3): qty 125

## 2020-07-17 MED ORDER — SODIUM CHLORIDE 0.9 % IV BOLUS
500.0000 mL | Freq: Once | INTRAVENOUS | Status: AC
  Administered 2020-07-17: 500 mL via INTRAVENOUS

## 2020-07-17 MED ORDER — MORPHINE SULFATE (PF) 2 MG/ML IV SOLN
2.0000 mg | Freq: Once | INTRAVENOUS | Status: AC
  Administered 2020-07-17: 2 mg via INTRAVENOUS
  Filled 2020-07-17: qty 1

## 2020-07-17 MED ORDER — ONDANSETRON HCL 4 MG/2ML IJ SOLN
4.0000 mg | Freq: Once | INTRAMUSCULAR | Status: AC
  Administered 2020-07-17: 4 mg via INTRAVENOUS
  Filled 2020-07-17: qty 2

## 2020-07-17 MED ORDER — VALACYCLOVIR HCL 500 MG PO TABS
1000.0000 mg | ORAL_TABLET | Freq: Three times a day (TID) | ORAL | Status: DC
  Administered 2020-07-17 – 2020-07-19 (×6): 1000 mg via ORAL
  Filled 2020-07-17 (×9): qty 2

## 2020-07-17 MED ORDER — HEPARIN (PORCINE) 25000 UT/250ML-% IV SOLN
800.0000 [IU]/h | INTRAVENOUS | Status: DC
  Administered 2020-07-17: 800 [IU]/h via INTRAVENOUS
  Filled 2020-07-17: qty 250

## 2020-07-17 NOTE — Progress Notes (Signed)
ANTICOAGULATION CONSULT NOTE - Initial Consult  Pharmacy Consult for IV heparin Indication: atrial fibrillation  No Known Allergies  Patient Measurements: Height: 5\' 4"  (162.6 cm) Weight: 68 kg (149 lb 14.6 oz) IBW/kg (Calculated) : 54.7 Heparin Dosing Weight: 68 kg  Vital Signs: Temp: 97.7 F (36.5 C) (11/06 1436) Temp Source: Oral (11/06 1436) BP: 114/67 (11/06 1436) Pulse Rate: 97 (11/06 1436)  Labs: Recent Labs    07/17/20 0420 07/17/20 0420 07/17/20 0710 07/17/20 0910 07/17/20 1150  HGB 15.7*  --   --   --   --   HCT 48.4*  --   --   --   --   PLT 252  --   --   --   --   CREATININE 1.08*  --   --   --   --   TROPONINIHS 10   < > 35* 51* 35*   < > = values in this interval not displayed.    Estimated Creatinine Clearance: 33.4 mL/min (A) (by C-G formula based on SCr of 1.08 mg/dL (H)).   Medical History: Past Medical History:  Diagnosis Date  . Arthritis   . Cataract   . Stroke [facility])    complication of aneurysm repair  . Thyroid disease     Medications:  Infusions:  . diltiazem (CARDIZEM) infusion 10 mg/hr (07/17/20 1353)  . heparin      Assessment: 86 yo female admitted with back pain, found aflutter. Pharmacy asked to begin anticoagulation with IV heparin.  No known hx of bleeding, no anticoagulants noted PTA.  Baseline CBC WNL.  Goal of Therapy:  Heparin level 0.3-0.7 units/ml Monitor platelets by anticoagulation protocol: Yes   Plan:  Start IV heparin at rate of 800 units/hr. Check heparin level in 8 hrs. Daily heparin level and CBC. F/u plans for oral anticoagulation eventually.  [place] Crane, Roylene Reason, BCCP Clinical Pharmacist  07/17/2020 2:52 PM   [facility] pharmacy phone numbers are listed on [email or web address]

## 2020-07-17 NOTE — ED Notes (Signed)
Patient transported to CT 

## 2020-07-17 NOTE — ED Notes (Signed)
Patient transported to x-ray. ?

## 2020-07-17 NOTE — Progress Notes (Signed)
Patient refuses ice for CT extravasion site. Educated on rationale for applying ice and left ice pack within reach of patient.

## 2020-07-17 NOTE — ED Notes (Signed)
Per Radiology Technician, IV in left AC infiltrated during administration of contrast dye. Swelling present, patient denies pain in area. Extremity elevated, ice applied per Radiology PA. Will continue to monitor response to interventions.

## 2020-07-17 NOTE — ED Provider Notes (Signed)
[facility]PART[facility] Provider Note   CSN: [id number] Arrival date & time: 07/17/20  0343     History Chief Complaint  Patient presents with  . Back Pain    Peggy Williams is a 86 y.o. female.  Patient presents to the emergency department for evaluation of left-sided back pain.  Symptoms began 3 or 4 days ago.  She denies any injury.  Pain is primarily in the left mid back and side area.  No anterior chest pain.  No shortness of breath.  No specific cough.  She has not had a fever.  Initially she was taking Naprosyn with improvement, but pain not responding today.        Past Medical History:  Diagnosis Date  . Arthritis   . Cataract   . Stroke ([place])    anuerysm  . Thyroid disease     There are no problems to display for this patient.   Past Surgical History:  Procedure Laterality Date  . ABDOMINAL HYSTERECTOMY    . BRAIN SURGERY    . EYE SURGERY    . JOINT REPLACEMENT       OB History   No obstetric history on file.     No family history on file.  Social History   Tobacco Use  . Smoking status: Current Every Day Smoker    Packs/day: 0.25    Years: 65.00    Pack years: 16.25    Types: Cigarettes  . Smokeless tobacco: Never Used  Substance Use Topics  . Alcohol use: No  . Drug use: No    Home Medications Prior to Admission medications   Medication Sig Start Date End Date Taking? Authorizing Provider  aspirin 81 MG tablet Take 81 mg by mouth at bedtime.    Yes [provider]  hydrochlorothiazide (MICROZIDE) 12.5 MG capsule Take 12.5 mg by mouth daily.   Yes [provider]  levothyroxine (SYNTHROID, LEVOTHROID) 50 MCG tablet Take 50 mcg by mouth daily before breakfast.   Yes [provider]    Allergies    Patient has no known allergies.  Review of Systems   Review of Systems  Musculoskeletal: Positive for back pain.  All other systems reviewed and are negative.   Physical  Exam Updated Vital Signs BP 123/81   Pulse (!) 135   Temp 98 F (36.7 C) (Oral)   Resp 15   Ht 5\' 4"  (1.626 m)   Wt 68 kg   SpO2 95%   BMI 25.73 kg/m   Physical Exam Vitals and nursing note reviewed.  Constitutional:      General: She is not in acute distress.    Appearance: Normal appearance. She is well-developed.  HENT:     Head: Normocephalic and atraumatic.     Right Ear: Hearing normal.     Left Ear: Hearing normal.     Nose: Nose normal.  Eyes:     Conjunctiva/sclera: Conjunctivae normal.     Pupils: Pupils are equal, round, and reactive to light.  Cardiovascular:     Rate and Rhythm: Regular rhythm.     Heart sounds: S1 normal and S2 normal. No murmur heard.  No friction rub. No gallop.   Pulmonary:     Effort: Pulmonary effort is normal. No respiratory distress.     Breath sounds: Normal breath sounds.  Chest:     Chest wall: No tenderness.  Abdominal:     General: Bowel sounds are normal.  Palpations: Abdomen is soft.     Tenderness: There is no abdominal tenderness. There is no guarding or rebound. Negative signs include Murphy's sign and McBurney's sign.     Hernia: No hernia is present.  Musculoskeletal:        General: Normal range of motion.     Cervical back: Normal range of motion and neck supple.  Skin:    General: Skin is warm and dry.     Findings: Rash (Patch of vesicles with mild erythema left flank) present.  Neurological:     Mental Status: She is alert and oriented to person, place, and time.     GCS: GCS eye subscore is 4. GCS verbal subscore is 5. GCS motor subscore is 6.     Cranial Nerves: No cranial nerve deficit.     Sensory: No sensory deficit.     Coordination: Coordination normal.  Psychiatric:        Speech: Speech normal.        Behavior: Behavior normal.        Thought Content: Thought content normal.     ED Results / Procedures / Treatments   Labs (all labs ordered are listed, but only abnormal results are  displayed) Labs Reviewed  CBC WITH DIFFERENTIAL/PLATELET - Abnormal; Notable for the following components:      Result Value   Hemoglobin 15.7 (*)    HCT 48.4 (*)    All other components within normal limits  COMPREHENSIVE METABOLIC PANEL - Abnormal; Notable for the following components:   Glucose, Bld 112 (*)    Creatinine, Ser 1.08 (*)    GFR, Estimated 49 (*)    All other components within normal limits  LIPASE, BLOOD  URINALYSIS, ROUTINE W REFLEX MICROSCOPIC  TROPONIN I (HIGH SENSITIVITY)  TROPONIN I (HIGH SENSITIVITY)    EKG EKG Interpretation  Date/Time:  Saturday July 17 2020 03:59:16 EDT Ventricular Rate:  138 PR Interval:    QRS Duration: 112 QT Interval:  296 QTC Calculation: 449 R Axis:   99 Text Interpretation: Sinus tachycardia Poor data quality in current ECG precludes serial comparison Confirmed by Orpah Greek [phone]) on 07/17/2020 4:03:17 AM   Radiology DG Chest 2 View  Result Date: 07/17/2020 CLINICAL DATA:  Back pain. EXAM: CHEST - 2 VIEW COMPARISON:  No pertinent prior exams are available for comparison. FINDINGS: Heart size within normal limits. Interstitial and ill-defined opacities at both lung bases. No evidence of pleural effusion or pneumothorax. Please refer to concurrently performed radiographs of the thoracic and lumbar spine for description of thoracolumbar findings. IMPRESSION: Interstitial and ill-defined opacities at both lung bases which may reflect interstitial lung disease. Pneumonia cannot be excluded. Electronically Signed   By: Kellie Simmering DO   On: 07/17/2020 07:15   DG Thoracic Spine 2 View  Result Date: 07/17/2020 CLINICAL DATA:  Back pain. EXAM: THORACIC SPINE 2 VIEWS COMPARISON:  Concurrently performed lumbar spine radiographs 07/17/2020. FINDINGS: Subtle levocurvature of the thoracic spine with partially imaged lumbar dextrocurvature. No significant spondylolisthesis. No appreciable thoracic vertebral compression  fracture. Thoracic spondylosis with multilevel disc space narrowing, degenerative endplate sclerosis and ventral osteophytes. Suspect mild age-indeterminate compression deformities of the L2 and possibly L1 vertebral bodies. IMPRESSION: No appreciable thoracic vertebral compression fracture. Thoracic spondylosis. Suspect mild age-indeterminate compression deformities of the L2 and possibly L1 vertebral bodies. Electronically Signed   By: Kellie Simmering DO   On: 07/17/2020 07:34   DG Lumbar Spine Complete  Result Date: 07/17/2020 CLINICAL DATA:  Back pain EXAM: LUMBAR SPINE - COMPLETE 4+ VIEW COMPARISON:  None. FINDINGS: There is a curvature of the thoracolumbar spine which is convex towards the right centered around the T12-L1 level. The bones appear diffusely osteopenic. Multi level degenerative disc disease is noted particularly most advanced at T12-L1 and L1-2. Moderate degenerative disc disease is also noted at L5-S1. Bilateral facet arthropathy. Aortic atherosclerotic calcifications noted. IMPRESSION: 1. No acute findings. 2. Scoliosis and degenerative disc disease. 3.  Aortic Atherosclerosis (ICD10-I70.0). Electronically Signed   By: Kerby Moors M.D.   On: 07/17/2020 07:26    Procedures Procedures (including critical care time)  Medications Ordered in ED Medications  sodium chloride 0.9 % bolus 500 mL (has no administration in time range)  sodium chloride 0.9 % bolus 500 mL (0 mLs Intravenous Stopped 07/17/20 0547)  morphine 2 MG/ML injection 2 mg (2 mg Intravenous Given 07/17/20 0428)  ondansetron (ZOFRAN) injection 4 mg (4 mg Intravenous Given 07/17/20 0428)  morphine 2 MG/ML injection 2 mg (2 mg Intravenous Given 07/17/20 0600)    ED Course  I have reviewed the triage vital signs and the nursing notes.  Pertinent labs & imaging results that were available during my care of the patient were reviewed by me and considered in my medical decision making (see chart for details).    MDM  Rules/Calculators/A&P                          Patient presents to the emergency department for evaluation of left-sided back pain.  Patient denies any recent trauma.  She is not short of breath.  She was taking NSAIDs initially with improvement but the pain is worse today.  Examination does reveal some vesicular lesions on the left flank that are concerning for shingles which might explain the pain.  She was, however, noted to be tachycardic at arrival.  No history of arrhythmia.  This appears to be a sinus tachycardia.  Cardiac evaluation also initiated.  No obvious ischemia or infarct on initial EKG.  First troponin negative.  Patient administered IV fluids as she does appear dry.  She was given analgesia.  Pain is much improved but she is still tachycardic.  Chest x-ray with bibasilar infiltrates or interstitial lung disease.  Will perform PE study to further evaluate.  Will sign out to oncoming ER physician to follow-up.  Final Clinical Impression(s) / ED Diagnoses Final diagnoses:  Acute left-sided thoracic back pain    Rx / DC Orders ED Discharge Orders    None       Orpah Greek, MD 07/17/20 [phone]

## 2020-07-17 NOTE — Progress Notes (Signed)
Pt arrived on 3E at 1425, pt brought up from the ED via stretcher and on monitor by ED RN and NT. Pt alert and oriented x4 upon arrival to floor. Respirations even and unlabored on 3 L/min O2. No acute distress noted. Pt c/o mild lower back pain, rated pain 4/10. Skin assessment completed with Melva, RN. Pt oriented to room. Pt encouraged to use call bell for assistance. Call bell within pt's reach.

## 2020-07-17 NOTE — H&P (Signed)
History and Physical    Peggy Williams IZT:[id number] DOB: [birth year] DOA: 07/17/2020  PCP: Lajean Manes, MD Consultants:  None Patient coming from: [place]; NOK: Peggy Williams, [phone]  Chief Complaint:   HPI: Peggy Williams is a 86 y.o. female with medical history significant of hypothyroidism and very remote aneurysm with post-procedure CVA (has chronic word-finding difficulties) presenting with back pain. She reports that she is here because of her back.  She lives independently, exercises daily.  She hurt her back Tuesday, treating it with Naproxen.  Last night it worsened.  The nurse came to see her and she was tachycardic with BP elevation and they sent her in.  She has not noticed CP or SOB.  No dizziness or falls.  She has consistently said no SOB but her O2 sats have dropped periodically mostly with sleep.  No h/o afib/aflutter.    ED Course:  Appears to have aflutter, not very responsive to Dilt drip - ?TEE conversion on Monday, cardiology to see.  Troponins gradually rising, likely rate related.  Mild O2 need - CTA negative for PE, ?B LL infiltrates, COVID negative.  Started on antibiotics in case of PNA but no leukocytosis.  HR in 120s now.  Was given boluses x 2 - ?overload.  Also with ?shingles outbreak on L lower chest.  Review of Systems: As per HPI; otherwise review of systems reviewed and negative.   Ambulatory Status:  Ambulates with a cane  COVID Vaccine Status:  Complete plus booster  Past Medical History:  Diagnosis Date  . Arthritis   . Cataract   . Stroke [facility])    complication of aneurysm repair  . Thyroid disease     Past Surgical History:  Procedure Laterality Date  . ABDOMINAL HYSTERECTOMY    . BRAIN SURGERY    . EYE SURGERY    . JOINT REPLACEMENT      Social History   Socioeconomic History  . Marital status: Widowed    Spouse name: Not on file  . Number of children: Not on file  . Years of education: Not on file  .  Highest education level: Not on file  Occupational History  . Occupation: retired  Tobacco Use  . Smoking status: Former Smoker    Packs/day: 1.00    Years: 45.00    Pack years: 45.00    Types: Cigarettes    Quit date: 2019    Years since quitting: 2.8  . Smokeless tobacco: Never Used  Substance and Sexual Activity  . Alcohol use: No  . Drug use: No  . Sexual activity: Never  Other Topics Concern  . Not on file  Social History Narrative  . Not on file   Social Determinants of Health   Financial Resource Strain:   . Difficulty of Paying Living Expenses: Not on file  Food Insecurity:   . Worried About Charity fundraiser in the Last Year: Not on file  . Ran Out of Food in the Last Year: Not on file  Transportation Needs:   . Lack of Transportation (Medical): Not on file  . Lack of Transportation (Non-Medical): Not on file  Physical Activity:   . Days of Exercise per Week: Not on file  . Minutes of Exercise per Session: Not on file  Stress:   . Feeling of Stress : Not on file  Social Connections:   . Frequency of Communication with Friends and Family: Not on file  . Frequency of Social Gatherings with  Friends and Family: Not on file  . Attends Religious Services: Not on file  . Active Member of Clubs or Organizations: Not on file  . Attends Archivist Meetings: Not on file  . Marital Status: Not on file  Intimate Partner Violence:   . Fear of Current or Ex-Partner: Not on file  . Emotionally Abused: Not on file  . Physically Abused: Not on file  . Sexually Abused: Not on file    No Known Allergies  History reviewed. No pertinent family history.  Prior to Admission medications   Medication Sig Start Date End Date Taking? Authorizing Provider  aspirin 81 MG tablet Take 81 mg by mouth at bedtime.    Yes [provider]  hydrochlorothiazide (MICROZIDE) 12.5 MG capsule Take 12.5 mg by mouth daily.   Yes [provider]  levothyroxine  (SYNTHROID, LEVOTHROID) 50 MCG tablet Take 50 mcg by mouth daily before breakfast.   Yes [provider]    Physical Exam: Vitals:   07/17/20 1354 07/17/20 1354 07/17/20 1436 07/17/20 1643  BP:   114/67 (!) 125/53  Pulse: 64  97 99  Resp: 18  18 18   Temp:  97.6 F (36.4 C) 97.7 F (36.5 C) 97.6 F (36.4 C)  TempSrc:  Oral Oral Oral  SpO2: 99%  95% 95%  Weight:      Height:         . General:  Appears calm and comfortable and is NAD . Eyes:  PERRL, EOMI, normal lids, iris . ENT:   Hard of hearing, grossly normal lips & tongue, mmm . Neck:  no LAD, masses or thyromegaly . Cardiovascular:  Irregularly irregular with tachycardia. No LE edema.  Marland Kitchen Respiratory:   Bibasilar crackles.  Normal respiratory effort on [place] O2. . Abdomen:  soft, NT, ND, NABS . Back:   normal alignment, no CVAT . Skin:  Vesicular rash in a single dermatome along left lower anterior ribcage . Musculoskeletal:  grossly normal tone LUE/BLE, good ROM, no bony abnormality; RUE hemiparesis . Psychiatric:  grossly normal mood and affect, speech appropriate but stuttered due to word finding difficulties (chronic), AOx3 . Neurologic:  CN 2-12 grossly intact, moves all extremities in coordinated fashion    Radiological Exams on Admission: Independently reviewed - see discussion in A/P where applicable  DG Chest 2 View  Result Date: 07/17/2020 CLINICAL DATA:  Back pain. EXAM: CHEST - 2 VIEW COMPARISON:  No pertinent prior exams are available for comparison. FINDINGS: Heart size within normal limits. Interstitial and ill-defined opacities at both lung bases. No evidence of pleural effusion or pneumothorax. Please refer to concurrently performed radiographs of the thoracic and lumbar spine for description of thoracolumbar findings. IMPRESSION: Interstitial and ill-defined opacities at both lung bases which may reflect interstitial lung disease. Pneumonia cannot be excluded. Electronically Signed   By: Kellie Simmering  DO   On: 07/17/2020 07:15   DG Thoracic Spine 2 View  Result Date: 07/17/2020 CLINICAL DATA:  Back pain. EXAM: THORACIC SPINE 2 VIEWS COMPARISON:  Concurrently performed lumbar spine radiographs 07/17/2020. FINDINGS: Subtle levocurvature of the thoracic spine with partially imaged lumbar dextrocurvature. No significant spondylolisthesis. No appreciable thoracic vertebral compression fracture. Thoracic spondylosis with multilevel disc space narrowing, degenerative endplate sclerosis and ventral osteophytes. Suspect mild age-indeterminate compression deformities of the L2 and possibly L1 vertebral bodies. IMPRESSION: No appreciable thoracic vertebral compression fracture. Thoracic spondylosis. Suspect mild age-indeterminate compression deformities of the L2 and possibly L1 vertebral bodies. Electronically Signed  By: Kellie Simmering DO   On: 07/17/2020 07:34   DG Lumbar Spine Complete  Result Date: 07/17/2020 CLINICAL DATA:  Back pain EXAM: LUMBAR SPINE - COMPLETE 4+ VIEW COMPARISON:  None. FINDINGS: There is a curvature of the thoracolumbar spine which is convex towards the right centered around the T12-L1 level. The bones appear diffusely osteopenic. Multi level degenerative disc disease is noted particularly most advanced at T12-L1 and L1-2. Moderate degenerative disc disease is also noted at L5-S1. Bilateral facet arthropathy. Aortic atherosclerotic calcifications noted. IMPRESSION: 1. No acute findings. 2. Scoliosis and degenerative disc disease. 3.  Aortic Atherosclerosis (ICD10-I70.0). Electronically Signed   By: Kerby Moors M.D.   On: 07/17/2020 07:26   CT ANGIO CHEST PE W OR WO CONTRAST  Result Date: 07/17/2020 CLINICAL DATA:  Evaluate for acute pulmonary embolus. EXAM: CT ANGIOGRAPHY CHEST WITH CONTRAST TECHNIQUE: Multidetector CT imaging of the chest was performed using the standard protocol during bolus administration of intravenous contrast. Multiplanar CT image reconstructions and MIPs were  obtained to evaluate the vascular anatomy. CONTRAST EXTRAVASATION CONSULTATION: Type of contrast:  Isovue 300 Site of extravasation: Left antecubital fossa Estimated volume of extravasation: 20 ml Area of extravasation scanned with CT? no PATIENT'S SIGNS AND SYMPTOMS Skin blistering/ulceration: no Decrease capillary refill: no Change in skin color: no Decreased motor function or severe tightness: no Decreased pulses distal to site of extravasation: no Altered sensation: no Increasing pain or signs of increased swelling during observation: no TREATMENT Observation period at site: Yes Limb elevation: yes Ice packs applied: yes Heat pads applied: yes Plastic surgery consulted? no DOCUMENTATION AND FOLLOW-UP Site contrast extravasation forms submitted? yes Post extravasation orders completed? yes Was additional follow up assigned to PA's? no Patient's questions answered? yes Patient instructed to call [phone] or seek immediate medical care if symptoms progress. CONTRAST:  25mL OMNIPAQUE IOHEXOL 350 MG/ML SOLN COMPARISON:  None. FINDINGS: Cardiovascular: Mild cardiac enlargement. No pericardial effusion. Aortic atherosclerosis. Coronary artery calcifications noted. Satisfactory opacification of the pulmonary arteries to the segmental level. Mediastinum/Nodes: Thyroid gland is not confidently identified and may be surgically absent or atrophic. The trachea appears patent and is midline. Normal appearance of the esophagus. No enlarged axillary, supraclavicular, or mediastinal adenopathy. Prominent subcarinal lymph node measures up to 1.2 cm. Enlarged right hilar lymph node has a short axis of 1.9 cm, image 78/5. Prominent left hilar lymph node measures 1.2 cm, image 69/5. Lungs/Pleura: Multifocal bilateral lower lung zone predominant areas of peripheral consolidation and ground-glass attenuation noted. Diffuse bronchial wall thickening is identified bilaterally. Multiple small millimetric lung nodules are scattered  throughout both lungs. These all measure less than 5 mm. For example anterior right upper lobe nodule measures 2 mm, image 50/7. Upper Abdomen: Small left renal calculi. Aortic atherosclerosis. Low-attenuation enlargement of the adrenal glands noted favoring adenomatous hyperplasia. No acute abnormality noted. Musculoskeletal: Degenerative disc disease identified within the thoracic spine. Mild scoliosis with thoracic convexity towards the left. Review of the MIP images confirms the above findings. IMPRESSION: 1. No evidence for acute pulmonary embolus. 2. Multifocal bilateral lower lung zone predominant areas of consolidation and ground-glass attenuation compatible with inflammation/infection. Short-term follow-up imaging in 3 months is recommended following appropriate antibiotic therapy and resolution of any symptoms. 3. Coronary artery calcifications noted. 4. Bilateral hilar and mediastinal adenopathy. This is a nonspecific finding in the setting of pneumonia and may be reactive in etiology. Attention on 3 month follow-up chest CT is recommended to ensure resolution. 5. Scattered nonspecific milli metric lung  nodules are noted bilaterally which are likely postinflammatory/infectious in etiology. Attention on follow-up imaging is advised. 6. Nonobstructing left renal calculi. 7. Aortic atherosclerosis. Aortic Atherosclerosis (ICD10-I70.0) and Emphysema (ICD10-J43.9). Electronically Signed   By: Kerby Moors M.D.   On: 07/17/2020 09:23    EKG: Independently reviewed.   0359- Atrial flutter with rate 138; no evidence of acute ischemia 1234- Atrial flutter with 2:1 block with rate 126; prolonged QTc 517; low voltage with no evidence of acute ischemia   Labs on Admission: I have personally reviewed the available labs and imaging studies at the time of the admission.  Pertinent labs:   Glucose 112 BN 12/Creatinine 1.08/GFR 49 WBC 9.8 Hgb 15.7 Lactate 1.3 Troponin 10, 35, 51 COVID/flu  negative   Assessment/Plan Principal Problem:   New onset atrial flutter (HCC) Active Problems:   History of CVA (cerebrovascular accident)   Shingles   DNR (do not resuscitate)   Atrial flutter -Patient presenting with new-onset aflutter -Etiology is thought to be related to onset of shingles  -Cardioversion should also be considered in patient if her rate control does not improve; she has not responded well to Diltiazem yet. -Will admit to SDU for Diltiazem drip as per protocol with plan to transition to PO Diltiazem once heart rate is controlled (resting HR 110 or lower); patient needs admission based on unstable vital signs (persistent tachycardia despite Dilt). -She has had periodic hypoxia in the ER and this appears to be related to mild iatrogenic volume overload (initially given 500 cc bolus x 2); will give a one-time dose of Lasix and continue to follow.  No ongoing suspicion for PNA at this time given clinical history so antibiotics were not continued (after 1-time dose of Rocephin/Azithro in the ER) -HS troponin minimally elevated, likely due to demand. -Will consult cardiology  -CHA2DS2-VASc Score is >2 and so patient would benefit from oral anticoagulation. There is evidence of net benefit even in the extremely elderly population. -Will start heparin for now pending possibility of cardioversion.  Shingles -Patient reported left-sided thoracic back pain and was noted to have an anterior vesicular dermatomal rash  -Suspect referred pain as the cause -Will start Valacyclovir 1000mg  q8h for 7 days -Will treat pain with Oxy IR prn  Hypothyroidism --Check TSH -Continue Synthroid at current dose for now  H/o CVA -Very remote, associated with aneurysmal surgery -Has persistent episodic word finding difficulties -Otherwise cognitively intact -Also with RUE hemiparesis  DNR -I have discussed code status with the patient and her son and  they are in agreement that the patient  would not desire resuscitation and would prefer to die a natural death should that situation arise. -She will need a gold out of facility DNR form at the time of discharge    Note: This patient has been tested and is negative for the novel coronavirus COVID-19. She has been fully vaccinated against COVID-19.    DVT prophylaxis: Heparin  Code Status:  DNR - confirmed with patient/son Family Communication: Son (who is a PA) was present throughout evaluation Disposition Plan:  The patient is from: Independent Living  Anticipated d/c is to: home without [facility] services once her cardiology issues have been resolved.  Anticipated d/c date will depend on clinical response to treatment, but likely 2-3 days  Patient is currently: acutely ill Consults called: Cardiology  Admission status: Admit - It is my clinical opinion that admission to INPATIENT is reasonable and necessary because of the expectation that this patient will require hospital  care that crosses at least 2 midnights to treat this condition based on the medical complexity of the problems presented.  Given the aforementioned information, the predictability of an adverse outcome is felt to be significant.    Karmen Bongo MD [facility]   How to contact the [facility] Attending or Consulting provider [place] or covering provider during after hours [place], for this patient?  1. Check the care team in [facility] and look for a) attending/consulting [facility] provider listed and b) the [facility] team listed 2. Log into [email or web address] and use [place]'s universal password to access. If you do not have the password, please contact the hospital operator. 3. Locate the [facility] provider you are looking for under [facility] and page to a number that you can be directly reached. 4. If you still have difficulty reaching the provider, please page the [facility] (Director on Call) for the Hospitalists listed on amion for assistance.   07/17/2020, 6:23 PM

## 2020-07-17 NOTE — ED Triage Notes (Signed)
Patient brought to ED from independent living by EMS with c/o left posterior back pain. Pain present since Tuesday, nonradiating, relieved by Naproxen until yesterday. No recent falls reported, denies headache, n/v. Per EMS, family says pt is at cognitive/speech baseline. Hx CVA with speech deficits, sometimes slower to respond. Given aspirin, SL nitro x2 en route with EMS. A&O x 4, pain rated 6/10.  EMS v/s: 190/130 BP A flutter, 140 HR

## 2020-07-17 NOTE — ED Notes (Signed)
Patient SpO2 decreasing to 89% on room air when falling asleep. 1L [place] applied, patient increased to 94-98%. Patient denies shortness of breath.

## 2020-07-17 NOTE — ED Notes (Signed)
Patient's SpO2 reading 89%, denies shortness of breath. Pulse oximeter moved to warm forehead, SpO2 98%.

## 2020-07-17 NOTE — Procedures (Signed)
Heart rate is too high for accurate echo at this time. 

## 2020-07-17 NOTE — Consult Note (Addendum)
Cardiology Consultation:   Patient ID: Chane Magner MRN: [id number]; DOB: 03-Jul-1929  Admit date: 07/17/2020 Date of Consult: 07/17/2020  Primary Care Provider: Lajean Manes, MD [facility] Cardiologist: No primary care provider on file.  [place] Electrophysiologist:  None    Patient Profile:   Peggy Williams is a 86 y.o. female with a hx of hypothyroidism, remote aneurysm with post-procedure CVA, preivous tobacco use who is being seen today for the evaluation of Aflutter at the request of Dr. Lorin Mercy.  History of Present Illness:   Ms. Quickel has no prior cardiac history. No history of MI, stent, CHF, afib/flutter, HTN, or HLD. She did have remote brain aneurysm treated with coiling and hen post procedure bleed. Patient still has word finding difficulty. She lives at an independent living and uses a cane for ambulation. She quit smoking 2 years ago. She drinks 1-2 vodka mixed drink daily.   The patient presented to the ED 07/17/20 for back pain. Says this has been going on for a week. Has been constant. It was a sharp pain and up to a 9/10. No history of back injury. Patient denies chest pain, shortness of breath, palpitations, fever, chills, nausea, vomiting, lightheadedness, dizziness.    In the ED vital showed BP 123/81, pulse 135, afebrile, RR 15, 95% O2. EKG sound to have Aflutter RVR. Labs showed potassium 5.0, sodium 142, glucose 112, creatinine 1.02, BUN 12, normal LFTs, WBC 9.8, Hgb 15.7, Lactic acid 1.3, HS troponin 10>35>51>35. CXR showed interstitial opacities at both lung bases. Imaging of the spine was negative for acute findings. CTA chest showed no PE, multifocal bilateral consolidation, coronary artery calcifications, adenopathy, lung nodules. The patient was started on IV dilt admitted for further work-up.    Past Medical History:  Diagnosis Date  . Arthritis   . Cataract   . Stroke ([place])    anuerysm  . Thyroid disease     Past Surgical History:  Procedure  Laterality Date  . ABDOMINAL HYSTERECTOMY    . BRAIN SURGERY    . EYE SURGERY    . JOINT REPLACEMENT       Home Medications:  Prior to Admission medications   Medication Sig Start Date End Date Taking? Authorizing Provider  aspirin 81 MG tablet Take 81 mg by mouth at bedtime.    Yes [provider]  hydrochlorothiazide (MICROZIDE) 12.5 MG capsule Take 12.5 mg by mouth daily.   Yes [provider]  levothyroxine (SYNTHROID, LEVOTHROID) 50 MCG tablet Take 50 mcg by mouth daily before breakfast.   Yes [provider]    Inpatient Medications: Scheduled Meds:  Continuous Infusions: . diltiazem (CARDIZEM) infusion 15 mg/hr (07/17/20 1130)   PRN Meds:   Allergies:   No Known Allergies  Social History:   Social History   Socioeconomic History  . Marital status: Widowed    Spouse name: Not on file  . Number of children: Not on file  . Years of education: Not on file  . Highest education level: Not on file  Occupational History  . Not on file  Tobacco Use  . Smoking status: Current Every Day Smoker    Packs/day: 0.25    Years: 65.00    Pack years: 16.25    Types: Cigarettes  . Smokeless tobacco: Never Used  Substance and Sexual Activity  . Alcohol use: No  . Drug use: No  . Sexual activity: Never  Other Topics Concern  . Not on file  Social History Narrative  .  Not on file   Social Determinants of Health   Financial Resource Strain:   . Difficulty of Paying Living Expenses: Not on file  Food Insecurity:   . Worried About Charity fundraiser in the Last Year: Not on file  . Ran Out of Food in the Last Year: Not on file  Transportation Needs:   . Lack of Transportation (Medical): Not on file  . Lack of Transportation (Non-Medical): Not on file  Physical Activity:   . Days of Exercise per Week: Not on file  . Minutes of Exercise per Session: Not on file  Stress:   . Feeling of Stress : Not on file  Social Connections:   . Frequency  of Communication with Friends and Family: Not on file  . Frequency of Social Gatherings with Friends and Family: Not on file  . Attends Religious Services: Not on file  . Active Member of Clubs or Organizations: Not on file  . Attends Archivist Meetings: Not on file  . Marital Status: Not on file  Intimate Partner Violence:   . Fear of Current or Ex-Partner: Not on file  . Emotionally Abused: Not on file  . Physically Abused: Not on file  . Sexually Abused: Not on file    Family History:   *No family history on file.   ROS:  Please see the history of present illness.  All other ROS reviewed and negative.     Physical Exam/Data:   Vitals:   07/17/20 1122 07/17/20 1130 07/17/20 1200 07/17/20 1230  BP: 103/80 103/79 120/82 120/82  Pulse: (!) 135 (!) 134 (!) 134 (!) 133  Resp: 19 17 16 14   Temp:      TempSrc:      SpO2: 92% 92% 94% 98%  Weight:      Height:       No intake or output data in the 24 hours ending 07/17/20 1305 Last 3 Weights 07/17/2020 08/01/2017 09/01/2013  Weight (lbs) 149 lb 14.6 oz 150 lb 145 lb  Weight (kg) 68 kg 68.04 kg 65.772 kg     Body mass index is 25.73 kg/m.  General:  Well nourished, well developed, in no acute distress HEENT: normal Lymph: no adenopathy Neck: + JVD Endocrine:  No thryomegaly Vascular: No carotid bruits; FA pulses 2+ bilaterally without bruits  Cardiac:  normal S1, S2; Reg Irreg, tachycardic; no murmur  Lungs:  Crackles at bases  Abd: soft, nontender, no hepatomegaly  Ext: no edema Musculoskeletal:  No deformities, BUE and BLE strength normal and equal Skin: warm and dry  Neuro:  CNs 2-12 intact, no focal abnormalities noted Psych:  Normal affect   EKG:  The EKG was personally reviewed and demonstrates:  Aflutter. possible2:1 Block, HR Telemetry:  Telemetry was personally reviewed and demonstrates:  Aflutter/fib, HR 120s-130s  Relevant CV Studies:  Echo ordered  Laboratory Data:  High Sensitivity  Troponin:   Recent Labs  Lab 07/17/20 0420 07/17/20 0710 07/17/20 0910  TROPONINIHS 10 35* 51*     Chemistry Recent Labs  Lab 07/17/20 0420  NA 142  K 5.0  CL 104  CO2 28  GLUCOSE 112*  BUN 12  CREATININE 1.08*  CALCIUM 9.5  GFRNONAA 49*  ANIONGAP 10    Recent Labs  Lab 07/17/20 0420  PROT 6.6  ALBUMIN 3.6  AST 22  ALT 19  ALKPHOS 53  BILITOT 0.8   Hematology Recent Labs  Lab 07/17/20 0420  WBC 9.8  RBC 5.00  HGB 15.7*  HCT 48.4*  MCV 96.8  MCH 31.4  MCHC 32.4  RDW 14.4  PLT 252   BNPNo results for input(s): BNP, PROBNP in the last 168 hours.  DDimer No results for input(s): DDIMER in the last 168 hours.   Radiology/Studies:  DG Chest 2 View  Result Date: 07/17/2020 CLINICAL DATA:  Back pain. EXAM: CHEST - 2 VIEW COMPARISON:  No pertinent prior exams are available for comparison. FINDINGS: Heart size within normal limits. Interstitial and ill-defined opacities at both lung bases. No evidence of pleural effusion or pneumothorax. Please refer to concurrently performed radiographs of the thoracic and lumbar spine for description of thoracolumbar findings. IMPRESSION: Interstitial and ill-defined opacities at both lung bases which may reflect interstitial lung disease. Pneumonia cannot be excluded. Electronically Signed   By: Kellie Simmering DO   On: 07/17/2020 07:15   DG Thoracic Spine 2 View  Result Date: 07/17/2020 CLINICAL DATA:  Back pain. EXAM: THORACIC SPINE 2 VIEWS COMPARISON:  Concurrently performed lumbar spine radiographs 07/17/2020. FINDINGS: Subtle levocurvature of the thoracic spine with partially imaged lumbar dextrocurvature. No significant spondylolisthesis. No appreciable thoracic vertebral compression fracture. Thoracic spondylosis with multilevel disc space narrowing, degenerative endplate sclerosis and ventral osteophytes. Suspect mild age-indeterminate compression deformities of the L2 and possibly L1 vertebral bodies. IMPRESSION: No  appreciable thoracic vertebral compression fracture. Thoracic spondylosis. Suspect mild age-indeterminate compression deformities of the L2 and possibly L1 vertebral bodies. Electronically Signed   By: Kellie Simmering DO   On: 07/17/2020 07:34   DG Lumbar Spine Complete  Result Date: 07/17/2020 CLINICAL DATA:  Back pain EXAM: LUMBAR SPINE - COMPLETE 4+ VIEW COMPARISON:  None. FINDINGS: There is a curvature of the thoracolumbar spine which is convex towards the right centered around the T12-L1 level. The bones appear diffusely osteopenic. Multi level degenerative disc disease is noted particularly most advanced at T12-L1 and L1-2. Moderate degenerative disc disease is also noted at L5-S1. Bilateral facet arthropathy. Aortic atherosclerotic calcifications noted. IMPRESSION: 1. No acute findings. 2. Scoliosis and degenerative disc disease. 3.  Aortic Atherosclerosis (ICD10-I70.0). Electronically Signed   By: Kerby Moors M.D.   On: 07/17/2020 07:26   CT ANGIO CHEST PE W OR WO CONTRAST  Result Date: 07/17/2020 CLINICAL DATA:  Evaluate for acute pulmonary embolus. EXAM: CT ANGIOGRAPHY CHEST WITH CONTRAST TECHNIQUE: Multidetector CT imaging of the chest was performed using the standard protocol during bolus administration of intravenous contrast. Multiplanar CT image reconstructions and MIPs were obtained to evaluate the vascular anatomy. CONTRAST EXTRAVASATION CONSULTATION: Type of contrast:  Isovue 300 Site of extravasation: Left antecubital fossa Estimated volume of extravasation: 20 ml Area of extravasation scanned with CT? no PATIENT'S SIGNS AND SYMPTOMS Skin blistering/ulceration: no Decrease capillary refill: no Change in skin color: no Decreased motor function or severe tightness: no Decreased pulses distal to site of extravasation: no Altered sensation: no Increasing pain or signs of increased swelling during observation: no TREATMENT Observation period at site: Yes Limb elevation: yes Ice packs applied:  yes Heat pads applied: yes Plastic surgery consulted? no DOCUMENTATION AND FOLLOW-UP Site contrast extravasation forms submitted? yes Post extravasation orders completed? yes Was additional follow up assigned to PA's? no Patient's questions answered? yes Patient instructed to call [phone] or seek immediate medical care if symptoms progress. CONTRAST:  53mL OMNIPAQUE IOHEXOL 350 MG/ML SOLN COMPARISON:  None. FINDINGS: Cardiovascular: Mild cardiac enlargement. No pericardial effusion. Aortic atherosclerosis. Coronary artery calcifications noted. Satisfactory opacification of the pulmonary arteries to the segmental level. Mediastinum/Nodes:  Thyroid gland is not confidently identified and may be surgically absent or atrophic. The trachea appears patent and is midline. Normal appearance of the esophagus. No enlarged axillary, supraclavicular, or mediastinal adenopathy. Prominent subcarinal lymph node measures up to 1.2 cm. Enlarged right hilar lymph node has a short axis of 1.9 cm, image 78/5. Prominent left hilar lymph node measures 1.2 cm, image 69/5. Lungs/Pleura: Multifocal bilateral lower lung zone predominant areas of peripheral consolidation and ground-glass attenuation noted. Diffuse bronchial wall thickening is identified bilaterally. Multiple small millimetric lung nodules are scattered throughout both lungs. These all measure less than 5 mm. For example anterior right upper lobe nodule measures 2 mm, image 50/7. Upper Abdomen: Small left renal calculi. Aortic atherosclerosis. Low-attenuation enlargement of the adrenal glands noted favoring adenomatous hyperplasia. No acute abnormality noted. Musculoskeletal: Degenerative disc disease identified within the thoracic spine. Mild scoliosis with thoracic convexity towards the left. Review of the MIP images confirms the above findings. IMPRESSION: 1. No evidence for acute pulmonary embolus. 2. Multifocal bilateral lower lung zone predominant areas of  consolidation and ground-glass attenuation compatible with inflammation/infection. Short-term follow-up imaging in 3 months is recommended following appropriate antibiotic therapy and resolution of any symptoms. 3. Coronary artery calcifications noted. 4. Bilateral hilar and mediastinal adenopathy. This is a nonspecific finding in the setting of pneumonia and may be reactive in etiology. Attention on 3 month follow-up chest CT is recommended to ensure resolution. 5. Scattered nonspecific milli metric lung nodules are noted bilaterally which are likely postinflammatory/infectious in etiology. Attention on follow-up imaging is advised. 6. Nonobstructing left renal calculi. 7. Aortic atherosclerosis. Aortic Atherosclerosis (ICD10-I70.0) and Emphysema (ICD10-J43.9). Electronically Signed   By: Kerby Moors M.D.   On: 07/17/2020 09:23     Assessment and Plan:   New onset Aflutter - Presented with back pain found to have Aflutter with rates up to 130s started on dilt drip - Keep Mag >2 and K>4 - check TSH - CHADSVASC at least 5 (agex2, female, vascular disease, stroke). Can start IV heparin while awaiting Echo results. - Imaging with multifocal bilateral areas of consolidation at bases, thought to be pleural effusions. Crackles on exam and moderate JVD. Can given IV lasix.  - Heart rates still elevated but improving, Bps soft. Patient is asymptomatic. Can consider TEE/DCCV if patient does not self-convert.  Elevated troponin - Flat trend and not consistent with ACS - coronary artery calcifications on CT - Patient denies chest pain  - PTA aspirin  Back pain - Imaging non-acute - possible from shingles - pain better after pain medicine - per IM  Hypothyroidism - check TSH as above  For questions or updates, please contact [facility] Please consult [email or web address] for contact info under   Signed, Cadence Ninfa Meeker, PA-C  07/17/2020 1:05 PM    The patient was seen, examined and discussed  with Cadence Ninfa Meeker, PA-C   and I agree with the above.   86 y.o. female with a hx of hypothyroidism, remote aneurysm with post-procedure CVA, preivous tobacco use, no prior cardiac history who is new to our service and is being seen today for the evaluation of Aflutter at the request of Dr. Lorin Mercy. No history of MI, stent, CHF, afib/flutter, HTN, or HLD. She did have remote brain aneurysm treated with coiling and hen post procedure bleed. She lives at an independent living and uses a cane for ambulation. She quit smoking 2 years ago. She drinks 1-2 vodka mixed drink daily.   The patient presented to  the ED 07/17/20 for back pain that has been ongoing for last week 9 out of 10 and sharp.  She was found to have shingles in the ER, she was also found to be in atrial flutter with variable block.  In the ED vital showed BP 123/81, pulse 135, afebrile, RR 15, 95% O2.  EKG shows atrial flutter with variable block and ventricular rate 126 bpm, low voltage QRS.    On physical exam she is hard of hearing, otherwise alert oriented x3, not in acute distress, no JVDs, S1-S2 irregular no significant murmur, with coarse sounds at the bases but no crackles. Labs showed potassium 5.0, sodium 142, glucose 112, creatinine 1.02, BUN 12, normal LFTs, WBC 9.8, Hgb 15.7, Lactic acid 1.3, HS troponin 10>35>51>35. CXR showed interstitial opacities at both lung bases.CTA chest showed no PE, multifocal bilateral consolidation, coronary artery calcifications.  Assessment and plan:  New onset atrial flutter with variable block, she was started on IV Cardizem, heart rate currently 90s, I would continue.  To new heparin drip, her hemoglobin is normal at 15.7.  We will obtain an echocardiogram.  Hopefully she spontaneously cardioverted otherwise we can plan for a cardioversion early next week.  Troponin elevation -in the settings of atrial flutter with RVR, flat trend and minimal elevation consistent with demand ischemia  Shingles  -she has been started on valacyclovir  Coronary calcifications -I would avoid statins in this elderly patient.   Ena Dawley, MD 07/17/2020

## 2020-07-17 NOTE — Progress Notes (Signed)
   Pt to CT this am for CTA Chest to R/O PE  IMPRESSION: 1. No evidence for acute pulmonary embolus. 2. Multifocal bilateral lower lung zone predominant areas of consolidation and ground-glass attenuation compatible with inflammation/infection. Short-term follow-up imaging in 3 months is recommended following appropriate antibiotic therapy and resolution of any symptoms. 3. Coronary artery calcifications noted. 4. Bilateral hilar and mediastinal adenopathy. This is a nonspecific finding in the setting of pneumonia and may be reactive in etiology. Attention on 3 month follow-up chest CT is recommended to ensure resolution. 5. Scattered nonspecific milli metric lung nodules are noted bilaterally which are likely postinflammatory/infectious in etiology. Attention on follow-up imaging is advised.  Noted left AC area extravasation at end of study Estimated 10-15 cc extravasation of CT contrast per CT Tech Study was able to be read with good contrast load  Left AC area is soft 2x3 cm area of puffiness noted at site Soft; NT  No redness; no loss of skin integrity 2+ pulses FRM Good sensation   Will recheck in am  Elevate for now Ice to area  Reassurance She has good understanding of plan

## 2020-07-18 ENCOUNTER — Inpatient Hospital Stay (HOSPITAL_COMMUNITY): Payer: Medicare Other

## 2020-07-18 DIAGNOSIS — I4892 Unspecified atrial flutter: Secondary | ICD-10-CM

## 2020-07-18 DIAGNOSIS — I361 Nonrheumatic tricuspid (valve) insufficiency: Secondary | ICD-10-CM

## 2020-07-18 DIAGNOSIS — I340 Nonrheumatic mitral (valve) insufficiency: Secondary | ICD-10-CM | POA: Diagnosis not present

## 2020-07-18 LAB — CBC
HCT: 46 % (ref 36.0–46.0)
Hemoglobin: 14.8 g/dL (ref 12.0–15.0)
MCH: 30.6 pg (ref 26.0–34.0)
MCHC: 32.2 g/dL (ref 30.0–36.0)
MCV: 95.2 fL (ref 80.0–100.0)
Platelets: 243 10*3/uL (ref 150–400)
RBC: 4.83 MIL/uL (ref 3.87–5.11)
RDW: 14.6 % (ref 11.5–15.5)
WBC: 10.2 10*3/uL (ref 4.0–10.5)
nRBC: 0 % (ref 0.0–0.2)

## 2020-07-18 LAB — ECHOCARDIOGRAM COMPLETE
Height: 64 in
S' Lateral: 2.9 cm
Weight: 2438.4 oz

## 2020-07-18 LAB — HEPARIN LEVEL (UNFRACTIONATED)
Heparin Unfractionated: >2.2 IU/mL — ABNORMAL HIGH (ref 0.30–0.70)
Heparin Unfractionated: 1.58 IU/mL — ABNORMAL HIGH (ref 0.30–0.70)

## 2020-07-18 LAB — BRAIN NATRIURETIC PEPTIDE: B Natriuretic Peptide: 516.9 pg/mL — ABNORMAL HIGH (ref 0.0–100.0)

## 2020-07-18 MED ORDER — APIXABAN 5 MG PO TABS
5.0000 mg | ORAL_TABLET | Freq: Two times a day (BID) | ORAL | Status: DC
  Administered 2020-07-18 – 2020-07-19 (×3): 5 mg via ORAL
  Filled 2020-07-18 (×3): qty 1

## 2020-07-18 MED ORDER — DILTIAZEM HCL 60 MG PO TABS
60.0000 mg | ORAL_TABLET | Freq: Three times a day (TID) | ORAL | Status: DC
  Administered 2020-07-18 – 2020-07-19 (×3): 60 mg via ORAL
  Filled 2020-07-18 (×3): qty 1

## 2020-07-18 MED ORDER — HEPARIN (PORCINE) 25000 UT/250ML-% IV SOLN: 600.0000 [IU]/h | INTRAVENOUS | Status: DC

## 2020-07-18 NOTE — Progress Notes (Signed)
ANTICOAGULATION CONSULT NOTE   Pharmacy Consult for IV heparin Indication: atrial fibrillation  No Known Allergies  Patient Measurements: Height: 5\' 4"  (162.6 cm) Weight: 69.6 kg (153 lb 6.4 oz) IBW/kg (Calculated) : 54.7 Heparin Dosing Weight: 68 kg  Vital Signs: Temp: 97.4 F (36.3 C) (11/07 0418) Temp Source: Oral (11/07 0418) BP: 121/67 (11/07 0418) Pulse Rate: 53 (11/07 0418)  Labs: Recent Labs    07/17/20 0420 07/17/20 0420 07/17/20 0710 07/17/20 0910 07/17/20 1150 07/18/20 0249  HGB 15.7*  --   --   --   --  14.8  HCT 48.4*  --   --   --   --  46.0  PLT 252  --   --   --   --  243  HEPARINUNFRC  --   --   --   --   --  >2.20*  CREATININE 1.08*  --   --   --   --   --   TROPONINIHS 10   < > 35* 51* 35*  --    < > = values in this interval not displayed.    Estimated Creatinine Clearance: 33.8 mL/min (A) (by C-G formula based on SCr of 1.08 mg/dL (H)).   Medical History: Past Medical History:  Diagnosis Date  . Arthritis   . Cataract   . Stroke [facility])    complication of aneurysm repair  . Thyroid disease     Medications:  Infusions:  . diltiazem (CARDIZEM) infusion 10 mg/hr (07/17/20 2214)  . heparin      Assessment: 86 yo female admitted with back pain, found aflutter. Pharmacy asked to begin anticoagulation with IV heparin.  No known hx of bleeding, no anticoagulants noted PTA.  Baseline CBC WNL.  11/7 AM update: Heparin level is elevated No issues per RN Per lab, this was drawn from arm opposite the heparin infusion   Goal of Therapy:  Heparin level 0.3-0.7 units/ml Monitor platelets by anticoagulation protocol: Yes   Plan:  Hold heparin x 1.5 hrs Re-start heparin drip at 600 units/hr at 0630 1500 heparin level  Narda Bonds, PharmD, [place] Pharmacist Phone: [phone]

## 2020-07-18 NOTE — Progress Notes (Signed)
Patient HR at rest is still Atrial Flutter but rate is much better between 80-110. Per Weaning orders, IV diltiazem discontinued as oral therapy has already been initiated. Will continue to monitor.

## 2020-07-18 NOTE — Progress Notes (Signed)
Documentation by nursing student Wyline Mood verified by this supervising RN

## 2020-07-18 NOTE — Progress Notes (Signed)
PROGRESS NOTE    Peggy Williams  HCW:[id number] DOB: 1928/12/26 DOA: 07/17/2020 PCP: Lajean Manes, MD   Brief Narrative:  Peggy Williams is a 86 y.o. female with medical history significant of hypothyroidism and very remote aneurysm with post-procedure CVA (has chronic word-finding difficulties) presenting with back pain. She reports that she is here because of her back.  She lives independently, exercises daily.  She hurt her back Tuesday, treating it with Naproxen.  Last night it worsened.  The nurse came to see her and she was tachycardic with BP elevation and they sent her in.  She has not noticed CP or SOB.  No dizziness or falls.  She has consistently said no SOB but her O2 sats have dropped periodically mostly with sleep.  No h/o afib/aflutter. In ED: Patient appears to have aflutter, not very responsive to Dilt drip - ?TEE conversion on Monday, cardiology to see.  Troponins gradually rising, likely rate related.  Mild O2 need - CTA negative for PE, ?B LL infiltrates, COVID negative.  Started on antibiotics in case of PNA but no leukocytosis.  HR in 120s now.  Was given boluses x 2 - ?overload.  Also with ?shingles outbreak on L lower chest.   Assessment & Plan:   Principal Problem:   New onset atrial flutter ([place]) Active Problems:   History of CVA (cerebrovascular accident)   Shingles   DNR (do not resuscitate)  New onset atrial flutter, currently rate controlled, POA -Cardiology following, appreciate insight recommendations -Transitioning from IV diltiazem to p.o., consider beta-blockade if LV dysfunction on echo per cardiology -Transition from heparin drip to oral Eliquis 5 mg twice daily given age and weight  Shingles with associated left flank/back pain, POA -Patient reports somewhat subacute if not chronic left sided paraspinal pain and tightness -Left lateral thoracic wall does show small cluster of erythematous bumps approximately 1 cm in diameter circle-possibly in the setting  of shingles although no known history of shingles per patient or her son at bedside  -Continue valacyclovir, as needed pain control, consider gabapentin if no improvement with NSAIDs   Hypothyroidism -Continue Synthroid at home dose Lab Results  Component Value Date   TSH 1.595 07/17/2020   H/o CVA -Very remote -30 years ago, associated with aneurysmal surgery -Has persistent right upper extremity weakness and episodic word finding difficulties, worse with anxiety or agitation   DVT prophylaxis: Heparin  Code Status:  DNR  Family Communication: Son (who is a PA) was present throughout evaluation Status is: Inpatient  Dispo: The patient is from: Facility              Anticipated d/c is to: Same              Anticipated d/c date is: 48 to 72 hours pending clinical course              Patient currently not medically stable for discharge  Consultants:   Cardiology  Procedures:   Echocardiogram  Antimicrobials:  Valacyclovir  Subjective: No acute issues or events overnight, patient remains asymptomatic in the setting of a flutter now rate controlled, she does continue to complain of left posterior lateral thoracic pain without clear exacerbating or alleviating factors.  She otherwise denies nausea, vomiting, diarrhea, constipation, headache, fevers, chills, chest pain, shortness of breath, or palpitations.  Objective: Vitals:   07/17/20 1958 07/17/20 2348 07/18/20 0112 07/18/20 0418  BP: 119/72 130/78  121/67  Pulse: 80 66  (!) 53  Resp: 18 20  20  Temp: 97.6 F (36.4 C) (!) 97.5 F (36.4 C)  (!) 97.4 F (36.3 C)  TempSrc: Oral Oral  Oral  SpO2: 95% (!) 86%  95%  Weight:   69.6 kg 69.1 kg  Height:        Intake/Output Summary (Last 24 hours) at 07/18/2020 0719 Last data filed at 07/17/2020 2100 Gross per 24 hour  Intake 349.28 ml  Output 700 ml  Net -350.72 ml   Filed Weights   07/17/20 0359 07/18/20 0112 07/18/20 0418  Weight: 68 kg 69.6 kg 69.1 kg     Examination:  General exam: Appears calm and comfortable  Respiratory system: Clear to auscultation. Respiratory effort normal. Cardiovascular system: S1 & S2 heard, RRR. No JVD, murmurs, rubs, gallops or clicks. No pedal edema. Gastrointestinal system: Abdomen is nondistended, soft and nontender. No organomegaly or masses felt. Normal bowel sounds heard. Central nervous system: Alert and oriented. No focal neurological deficits. Extremities: Symmetric 5 x 5 power. Skin: 1 cm circumferential area of cluster of small erythematous vesicles at the left thoracic chest wall Psychiatry: Judgement and insight appear normal. Mood & affect appropriate.   Data Reviewed: I have personally reviewed following labs and imaging studies  CBC: Recent Labs  Lab 07/17/20 0420 07/18/20 0249  WBC 9.8 10.2  NEUTROABS 6.7  --   HGB 15.7* 14.8  HCT 48.4* 46.0  MCV 96.8 95.2  PLT 252 716   Basic Metabolic Panel: Recent Labs  Lab 07/17/20 0420  NA 142  K 5.0  CL 104  CO2 28  GLUCOSE 112*  BUN 12  CREATININE 1.08*  CALCIUM 9.5   GFR: Estimated Creatinine Clearance: 33.7 mL/min (A) (by C-G formula based on SCr of 1.08 mg/dL (H)). Liver Function Tests: Recent Labs  Lab 07/17/20 0420  AST 22  ALT 19  ALKPHOS 53  BILITOT 0.8  PROT 6.6  ALBUMIN 3.6   Recent Labs  Lab 07/17/20 0420  LIPASE 46   No results for input(s): AMMONIA in the last 168 hours. Coagulation Profile: No results for input(s): INR, PROTIME in the last 168 hours. Cardiac Enzymes: No results for input(s): CKTOTAL, CKMB, CKMBINDEX, TROPONINI in the last 168 hours. BNP (last 3 results) No results for input(s): PROBNP in the last 8760 hours. HbA1C: No results for input(s): HGBA1C in the last 72 hours. CBG: No results for input(s): GLUCAP in the last 168 hours. Lipid Profile: No results for input(s): CHOL, HDL, LDLCALC, TRIG, CHOLHDL, LDLDIRECT in the last 72 hours. Thyroid Function Tests: Recent Labs     07/17/20 1500  TSH 1.595   Anemia Panel: No results for input(s): VITAMINB12, FOLATE, FERRITIN, TIBC, IRON, RETICCTPCT in the last 72 hours. Sepsis Labs: Recent Labs  Lab 07/17/20 1030  LATICACIDVEN 1.3    Recent Results (from the past 240 hour(s))  Respiratory Panel by RT PCR (Flu A&B, Covid) - Nasopharyngeal Swab     Status: None   Collection Time: 07/17/20  9:15 AM   Specimen: Nasopharyngeal Swab  Result Value Ref Range Status   SARS Coronavirus 2 by RT PCR NEGATIVE NEGATIVE Final    Comment: (NOTE) SARS-CoV-2 target nucleic acids are NOT DETECTED.  The SARS-CoV-2 RNA is generally detectable in upper respiratoy specimens during the acute phase of infection. The lowest concentration of SARS-CoV-2 viral copies this assay can detect is 131 copies/mL. A negative result does not preclude SARS-Cov-2 infection and should not be used as the sole basis for treatment or other patient management decisions. A  negative result may occur with  improper specimen collection/handling, submission of specimen other than nasopharyngeal swab, presence of viral mutation(s) within the areas targeted by this assay, and inadequate number of viral copies (<131 copies/mL). A negative result must be combined with clinical observations, patient history, and epidemiological information. The expected result is Negative.  Fact Sheet for Patients:  PinkCheek.be  Fact Sheet for Healthcare Providers:  GravelBags.it  This test is no t yet approved or cleared by the [place] FDA and  has been authorized for detection and/or diagnosis of SARS-CoV-2 by FDA under an Emergency Use Authorization (EUA). This EUA will remain  in effect (meaning this test can be used) for the duration of the COVID-19 declaration under Section 564(b)(1) of the Act, 21 U.S.C. section 360bbb-3(b)(1), unless the authorization is terminated or revoked sooner.      Influenza A by PCR NEGATIVE NEGATIVE Final   Influenza B by PCR NEGATIVE NEGATIVE Final    Comment: (NOTE) The Xpert Xpress SARS-CoV-2/FLU/RSV assay is intended as an aid in  the diagnosis of influenza from Nasopharyngeal swab specimens and  should not be used as a sole basis for treatment. Nasal washings and  aspirates are unacceptable for Xpert Xpress SARS-CoV-2/FLU/RSV  testing.  Fact Sheet for Patients: PinkCheek.be  Fact Sheet for Healthcare Providers: GravelBags.it  This test is not yet approved or cleared by the [place] FDA and  has been authorized for detection and/or diagnosis of SARS-CoV-2 by  FDA under an Emergency Use Authorization (EUA). This EUA will remain  in effect (meaning this test can be used) for the duration of the  Covid-19 declaration under Section 564(b)(1) of the Act, 21  U.S.C. section 360bbb-3(b)(1), unless the authorization is  terminated or revoked. Performed at [facility], [place] [address] [place], [place] [address]          Radiology Studies: DG Chest 2 View  Result Date: 07/17/2020 CLINICAL DATA:  Back pain. EXAM: CHEST - 2 VIEW COMPARISON:  No pertinent prior exams are available for comparison. FINDINGS: Heart size within normal limits. Interstitial and ill-defined opacities at both lung bases. No evidence of pleural effusion or pneumothorax. Please refer to concurrently performed radiographs of the thoracic and lumbar spine for description of thoracolumbar findings. IMPRESSION: Interstitial and ill-defined opacities at both lung bases which may reflect interstitial lung disease. Pneumonia cannot be excluded. Electronically Signed   By: Kellie Simmering DO   On: 07/17/2020 07:15   DG Thoracic Spine 2 View  Result Date: 07/17/2020 CLINICAL DATA:  Back pain. EXAM: THORACIC SPINE 2 VIEWS COMPARISON:  Concurrently performed lumbar spine radiographs 07/17/2020. FINDINGS: Subtle  levocurvature of the thoracic spine with partially imaged lumbar dextrocurvature. No significant spondylolisthesis. No appreciable thoracic vertebral compression fracture. Thoracic spondylosis with multilevel disc space narrowing, degenerative endplate sclerosis and ventral osteophytes. Suspect mild age-indeterminate compression deformities of the L2 and possibly L1 vertebral bodies. IMPRESSION: No appreciable thoracic vertebral compression fracture. Thoracic spondylosis. Suspect mild age-indeterminate compression deformities of the L2 and possibly L1 vertebral bodies. Electronically Signed   By: Kellie Simmering DO   On: 07/17/2020 07:34   DG Lumbar Spine Complete  Result Date: 07/17/2020 CLINICAL DATA:  Back pain EXAM: LUMBAR SPINE - COMPLETE 4+ VIEW COMPARISON:  None. FINDINGS: There is a curvature of the thoracolumbar spine which is convex towards the right centered around the T12-L1 level. The bones appear diffusely osteopenic. Multi level degenerative disc disease is noted particularly most advanced at T12-L1 and L1-2. Moderate  degenerative disc disease is also noted at L5-S1. Bilateral facet arthropathy. Aortic atherosclerotic calcifications noted. IMPRESSION: 1. No acute findings. 2. Scoliosis and degenerative disc disease. 3.  Aortic Atherosclerosis (ICD10-I70.0). Electronically Signed   By: Kerby Moors M.D.   On: 07/17/2020 07:26   CT ANGIO CHEST PE W OR WO CONTRAST  Result Date: 07/17/2020 CLINICAL DATA:  Evaluate for acute pulmonary embolus. EXAM: CT ANGIOGRAPHY CHEST WITH CONTRAST TECHNIQUE: Multidetector CT imaging of the chest was performed using the standard protocol during bolus administration of intravenous contrast. Multiplanar CT image reconstructions and MIPs were obtained to evaluate the vascular anatomy. CONTRAST EXTRAVASATION CONSULTATION: Type of contrast:  Isovue 300 Site of extravasation: Left antecubital fossa Estimated volume of extravasation: 20 ml Area of extravasation scanned  with CT? no PATIENT'S SIGNS AND SYMPTOMS Skin blistering/ulceration: no Decrease capillary refill: no Change in skin color: no Decreased motor function or severe tightness: no Decreased pulses distal to site of extravasation: no Altered sensation: no Increasing pain or signs of increased swelling during observation: no TREATMENT Observation period at site: Yes Limb elevation: yes Ice packs applied: yes Heat pads applied: yes Plastic surgery consulted? no DOCUMENTATION AND FOLLOW-UP Site contrast extravasation forms submitted? yes Post extravasation orders completed? yes Was additional follow up assigned to PA's? no Patient's questions answered? yes Patient instructed to call [phone] or seek immediate medical care if symptoms progress. CONTRAST:  49mL OMNIPAQUE IOHEXOL 350 MG/ML SOLN COMPARISON:  None. FINDINGS: Cardiovascular: Mild cardiac enlargement. No pericardial effusion. Aortic atherosclerosis. Coronary artery calcifications noted. Satisfactory opacification of the pulmonary arteries to the segmental level. Mediastinum/Nodes: Thyroid gland is not confidently identified and may be surgically absent or atrophic. The trachea appears patent and is midline. Normal appearance of the esophagus. No enlarged axillary, supraclavicular, or mediastinal adenopathy. Prominent subcarinal lymph node measures up to 1.2 cm. Enlarged right hilar lymph node has a short axis of 1.9 cm, image 78/5. Prominent left hilar lymph node measures 1.2 cm, image 69/5. Lungs/Pleura: Multifocal bilateral lower lung zone predominant areas of peripheral consolidation and ground-glass attenuation noted. Diffuse bronchial wall thickening is identified bilaterally. Multiple small millimetric lung nodules are scattered throughout both lungs. These all measure less than 5 mm. For example anterior right upper lobe nodule measures 2 mm, image 50/7. Upper Abdomen: Small left renal calculi. Aortic atherosclerosis. Low-attenuation enlargement of the  adrenal glands noted favoring adenomatous hyperplasia. No acute abnormality noted. Musculoskeletal: Degenerative disc disease identified within the thoracic spine. Mild scoliosis with thoracic convexity towards the left. Review of the MIP images confirms the above findings. IMPRESSION: 1. No evidence for acute pulmonary embolus. 2. Multifocal bilateral lower lung zone predominant areas of consolidation and ground-glass attenuation compatible with inflammation/infection. Short-term follow-up imaging in 3 months is recommended following appropriate antibiotic therapy and resolution of any symptoms. 3. Coronary artery calcifications noted. 4. Bilateral hilar and mediastinal adenopathy. This is a nonspecific finding in the setting of pneumonia and may be reactive in etiology. Attention on 3 month follow-up chest CT is recommended to ensure resolution. 5. Scattered nonspecific milli metric lung nodules are noted bilaterally which are likely postinflammatory/infectious in etiology. Attention on follow-up imaging is advised. 6. Nonobstructing left renal calculi. 7. Aortic atherosclerosis. Aortic Atherosclerosis (ICD10-I70.0) and Emphysema (ICD10-J43.9). Electronically Signed   By: Kerby Moors M.D.   On: 07/17/2020 09:23        Scheduled Meds: . levothyroxine  50 mcg Oral QAC breakfast  . valACYclovir  1,000 mg Oral TID   Continuous Infusions: . diltiazem (  CARDIZEM) infusion 10 mg/hr (07/17/20 2214)  . heparin       LOS: 1 day   Time spent: 75min  Karrah Mangini C Janzen Sacks, DO [facility]  If 7PM-7AM, please contact night-coverage [email or web address]  07/18/2020, 7:19 AM

## 2020-07-18 NOTE — Progress Notes (Signed)
  Echocardiogram 2D Echocardiogram has been performed.  Johny Chess 07/18/2020, 11:27 AM

## 2020-07-18 NOTE — Progress Notes (Signed)
Progress Note  Patient Name: Peggy Williams Date of Encounter: 07/18/2020  [facility] Cardiologist: New  Subjective   No complaints  Inpatient Medications    Scheduled Meds: . levothyroxine  50 mcg Oral QAC breakfast  . valACYclovir  1,000 mg Oral TID   Continuous Infusions: . diltiazem (CARDIZEM) infusion 10 mg/hr (07/18/20 0936)  . heparin     PRN Meds: acetaminophen, ondansetron (ZOFRAN) IV, oxyCODONE   Vital Signs    Vitals:   07/17/20 2348 07/18/20 0112 07/18/20 0418 07/18/20 0731  BP: 130/78  121/67 118/68  Pulse: 66  (!) 53 74  Resp: 20  20 18   Temp: (!) 97.5 F (36.4 C)  (!) 97.4 F (36.3 C) (!) 97.4 F (36.3 C)  TempSrc: Oral  Oral Oral  SpO2: (!) 86%  95% 91%  Weight:  69.6 kg 69.1 kg   Height:        Intake/Output Summary (Last 24 hours) at 07/18/2020 0946 Last data filed at 07/18/2020 0858 Gross per 24 hour  Intake 729.28 ml  Output 700 ml  Net 29.28 ml   Last 3 Weights 07/18/2020 07/18/2020 07/17/2020  Weight (lbs) 152 lb 6.4 oz 153 lb 6.4 oz 149 lb 14.6 oz  Weight (kg) 69.128 kg 69.582 kg 68 kg      Telemetry    Rate controlled aflutter- Personally Reviewed  ECG    n/a - Personally Reviewed  Physical Exam   GEN: No acute distress.   Neck: No JVD Cardiac: irreg, no murmurs, rubs, or gallops.  Respiratory: Clear to auscultation bilaterally. GI: Soft, nontender, non-distended  MS: No edema; No deformity. Neuro:  Nonfocal  Psych: Normal affect   Labs    High Sensitivity Troponin:   Recent Labs  Lab 07/17/20 0420 07/17/20 0710 07/17/20 0910 07/17/20 1150  TROPONINIHS 10 35* 51* 35*      Chemistry Recent Labs  Lab 07/17/20 0420  NA 142  K 5.0  CL 104  CO2 28  GLUCOSE 112*  BUN 12  CREATININE 1.08*  CALCIUM 9.5  PROT 6.6  ALBUMIN 3.6  AST 22  ALT 19  ALKPHOS 53  BILITOT 0.8  GFRNONAA 49*  ANIONGAP 10     Hematology Recent Labs  Lab 07/17/20 0420 07/18/20 0249  WBC 9.8 10.2  RBC 5.00 4.83  HGB 15.7*  14.8  HCT 48.4* 46.0  MCV 96.8 95.2  MCH 31.4 30.6  MCHC 32.4 32.2  RDW 14.4 14.6  PLT 252 243    BNPNo results for input(s): BNP, PROBNP in the last 168 hours.   DDimer No results for input(s): DDIMER in the last 168 hours.   Radiology    DG Chest 2 View  Result Date: 07/17/2020 CLINICAL DATA:  Back pain. EXAM: CHEST - 2 VIEW COMPARISON:  No pertinent prior exams are available for comparison. FINDINGS: Heart size within normal limits. Interstitial and ill-defined opacities at both lung bases. No evidence of pleural effusion or pneumothorax. Please refer to concurrently performed radiographs of the thoracic and lumbar spine for description of thoracolumbar findings. IMPRESSION: Interstitial and ill-defined opacities at both lung bases which may reflect interstitial lung disease. Pneumonia cannot be excluded. Electronically Signed   By: Kellie Simmering DO   On: 07/17/2020 07:15   DG Thoracic Spine 2 View  Result Date: 07/17/2020 CLINICAL DATA:  Back pain. EXAM: THORACIC SPINE 2 VIEWS COMPARISON:  Concurrently performed lumbar spine radiographs 07/17/2020. FINDINGS: Subtle levocurvature of the thoracic spine with partially imaged lumbar dextrocurvature. No  significant spondylolisthesis. No appreciable thoracic vertebral compression fracture. Thoracic spondylosis with multilevel disc space narrowing, degenerative endplate sclerosis and ventral osteophytes. Suspect mild age-indeterminate compression deformities of the L2 and possibly L1 vertebral bodies. IMPRESSION: No appreciable thoracic vertebral compression fracture. Thoracic spondylosis. Suspect mild age-indeterminate compression deformities of the L2 and possibly L1 vertebral bodies. Electronically Signed   By: Kellie Simmering DO   On: 07/17/2020 07:34   DG Lumbar Spine Complete  Result Date: 07/17/2020 CLINICAL DATA:  Back pain EXAM: LUMBAR SPINE - COMPLETE 4+ VIEW COMPARISON:  None. FINDINGS: There is a curvature of the thoracolumbar spine  which is convex towards the right centered around the T12-L1 level. The bones appear diffusely osteopenic. Multi level degenerative disc disease is noted particularly most advanced at T12-L1 and L1-2. Moderate degenerative disc disease is also noted at L5-S1. Bilateral facet arthropathy. Aortic atherosclerotic calcifications noted. IMPRESSION: 1. No acute findings. 2. Scoliosis and degenerative disc disease. 3.  Aortic Atherosclerosis (ICD10-I70.0). Electronically Signed   By: Kerby Moors M.D.   On: 07/17/2020 07:26   CT ANGIO CHEST PE W OR WO CONTRAST  Result Date: 07/17/2020 CLINICAL DATA:  Evaluate for acute pulmonary embolus. EXAM: CT ANGIOGRAPHY CHEST WITH CONTRAST TECHNIQUE: Multidetector CT imaging of the chest was performed using the standard protocol during bolus administration of intravenous contrast. Multiplanar CT image reconstructions and MIPs were obtained to evaluate the vascular anatomy. CONTRAST EXTRAVASATION CONSULTATION: Type of contrast:  Isovue 300 Site of extravasation: Left antecubital fossa Estimated volume of extravasation: 20 ml Area of extravasation scanned with CT? no PATIENT'S SIGNS AND SYMPTOMS Skin blistering/ulceration: no Decrease capillary refill: no Change in skin color: no Decreased motor function or severe tightness: no Decreased pulses distal to site of extravasation: no Altered sensation: no Increasing pain or signs of increased swelling during observation: no TREATMENT Observation period at site: Yes Limb elevation: yes Ice packs applied: yes Heat pads applied: yes Plastic surgery consulted? no DOCUMENTATION AND FOLLOW-UP Site contrast extravasation forms submitted? yes Post extravasation orders completed? yes Was additional follow up assigned to PA's? no Patient's questions answered? yes Patient instructed to call [phone] or seek immediate medical care if symptoms progress. CONTRAST:  39mL OMNIPAQUE IOHEXOL 350 MG/ML SOLN COMPARISON:  None. FINDINGS:  Cardiovascular: Mild cardiac enlargement. No pericardial effusion. Aortic atherosclerosis. Coronary artery calcifications noted. Satisfactory opacification of the pulmonary arteries to the segmental level. Mediastinum/Nodes: Thyroid gland is not confidently identified and may be surgically absent or atrophic. The trachea appears patent and is midline. Normal appearance of the esophagus. No enlarged axillary, supraclavicular, or mediastinal adenopathy. Prominent subcarinal lymph node measures up to 1.2 cm. Enlarged right hilar lymph node has a short axis of 1.9 cm, image 78/5. Prominent left hilar lymph node measures 1.2 cm, image 69/5. Lungs/Pleura: Multifocal bilateral lower lung zone predominant areas of peripheral consolidation and ground-glass attenuation noted. Diffuse bronchial wall thickening is identified bilaterally. Multiple small millimetric lung nodules are scattered throughout both lungs. These all measure less than 5 mm. For example anterior right upper lobe nodule measures 2 mm, image 50/7. Upper Abdomen: Small left renal calculi. Aortic atherosclerosis. Low-attenuation enlargement of the adrenal glands noted favoring adenomatous hyperplasia. No acute abnormality noted. Musculoskeletal: Degenerative disc disease identified within the thoracic spine. Mild scoliosis with thoracic convexity towards the left. Review of the MIP images confirms the above findings. IMPRESSION: 1. No evidence for acute pulmonary embolus. 2. Multifocal bilateral lower lung zone predominant areas of consolidation and ground-glass attenuation compatible with inflammation/infection. Short-term follow-up  imaging in 3 months is recommended following appropriate antibiotic therapy and resolution of any symptoms. 3. Coronary artery calcifications noted. 4. Bilateral hilar and mediastinal adenopathy. This is a nonspecific finding in the setting of pneumonia and may be reactive in etiology. Attention on 3 month follow-up chest CT is  recommended to ensure resolution. 5. Scattered nonspecific milli metric lung nodules are noted bilaterally which are likely postinflammatory/infectious in etiology. Attention on follow-up imaging is advised. 6. Nonobstructing left renal calculi. 7. Aortic atherosclerosis. Aortic Atherosclerosis (ICD10-I70.0) and Emphysema (ICD10-J43.9). Electronically Signed   By: Kerby Moors M.D.   On: 07/17/2020 09:23    Cardiac Studies    Patient Profile     Valary Manahan is a 86 y.o. female with a hx of hypothyroidism, remote aneurysm with post-procedure CVA, preivous tobacco use who is being seen today for the evaluation of Aflutter at the request of Dr. Lorin Mercy.  Assessment & Plan    1. Aflutter - new diagnosis this admission, incidental finding after presenting to ER with back pain - started on dilt gtt - started on IV heparin for CHADS2Vasc score of 5  - start oral dilt 60mg  every 8 hours with hold parameters. If any LV dysfunction on echo would change to beta blocker - she has been asymptomatic entire time, incidental finding with presenting with back pain. I think if can rate control her would discharge and reevalaute as outpatient to consider DCCv after 3 weeks of anticoag. Do not see strong indication for inpatient TEE/DCCV unless unable to rate control on oral regimen.  - d/c heparin,start oral eliquis 5mg  bid based on age weight Cr.    2. Elevated troponin - very mild in setting of aflutter with RVR, suspect demand ischemia - she has had no cardiopulmonary symptoms.  - follow up echo - no plans for ischemic testing at this time  3. Shingles - per primary team    For questions or updates, please contact [place] Please consult [email or web address] for contact info under        Signed, Carlyle Dolly, MD  07/18/2020, 9:46 AM

## 2020-07-19 ENCOUNTER — Other Ambulatory Visit (HOSPITAL_COMMUNITY): Payer: Self-pay | Admitting: Internal Medicine

## 2020-07-19 DIAGNOSIS — I4892 Unspecified atrial flutter: Secondary | ICD-10-CM | POA: Diagnosis not present

## 2020-07-19 LAB — BASIC METABOLIC PANEL WITH GFR
Anion gap: 8 (ref 5–15)
BUN: 9 mg/dL (ref 8–23)
CO2: 26 mmol/L (ref 22–32)
Calcium: 9.3 mg/dL (ref 8.9–10.3)
Chloride: 107 mmol/L (ref 98–111)
Creatinine, Ser: 0.86 mg/dL (ref 0.44–1.00)
GFR, Estimated: >60 mL/min
Glucose, Bld: 106 mg/dL — ABNORMAL HIGH (ref 70–99)
Potassium: 4.2 mmol/L (ref 3.5–5.1)
Sodium: 141 mmol/L (ref 135–145)

## 2020-07-19 LAB — CBC
HCT: 45.8 % (ref 36.0–46.0)
Hemoglobin: 14.9 g/dL (ref 12.0–15.0)
MCH: 30.7 pg (ref 26.0–34.0)
MCHC: 32.5 g/dL (ref 30.0–36.0)
MCV: 94.2 fL (ref 80.0–100.0)
Platelets: 241 10*3/uL (ref 150–400)
RBC: 4.86 MIL/uL (ref 3.87–5.11)
RDW: 14.2 % (ref 11.5–15.5)
WBC: 9 10*3/uL (ref 4.0–10.5)
nRBC: 0 % (ref 0.0–0.2)

## 2020-07-19 MED ORDER — APIXABAN 5 MG PO TABS
5.0000 mg | ORAL_TABLET | Freq: Two times a day (BID) | ORAL | 0 refills | Status: DC
Start: 2020-07-19 — End: 2020-09-15

## 2020-07-19 MED ORDER — DILTIAZEM HCL ER COATED BEADS 240 MG PO CP24
240.0000 mg | ORAL_CAPSULE | Freq: Every day | ORAL | 0 refills | Status: DC
Start: 2020-07-19 — End: 2020-09-15

## 2020-07-19 MED ORDER — VALACYCLOVIR HCL 1 G PO TABS: 1000.0000 mg | ORAL_TABLET | Freq: Three times a day (TID) | ORAL | 0 refills | Status: DC

## 2020-07-19 MED ORDER — DILTIAZEM HCL ER COATED BEADS 240 MG PO CP24
240.0000 mg | ORAL_CAPSULE | Freq: Every day | ORAL | Status: DC
  Administered 2020-07-19: 240 mg via ORAL
  Filled 2020-07-19: qty 1

## 2020-07-19 MED FILL — CARTIA XT 240 MG CAPSULE: 240 | 30 days supply | Qty: 30 | Fill #0

## 2020-07-19 MED FILL — valACYclovir HCL 1 GM TABS: 1 | 5 days supply | Qty: 15 | Fill #0

## 2020-07-19 MED FILL — ELIQUIS 5 MG TABLET: 5 | 30 days supply | Qty: 60 | Fill #0

## 2020-07-19 NOTE — Progress Notes (Signed)
Supervising Physician: Aletta Edouard  Patient Status:  [facility] - In-pt  Chief Complaint: Follow up left upper extremity contrast extravasation 11/6  Subjective:  Patient sitting up in bed, son at bedside. She is looking forward to going home today but is hoping to have her soup delivered before that happens. She has not had any complaints about her arm after contrast extravasation.  Allergies: Patient has no known allergies.  Medications: Prior to Admission medications   Medication Sig Start Date End Date Taking? Authorizing Provider  aspirin 81 MG tablet Take 81 mg by mouth at bedtime.    Yes [provider]  hydrochlorothiazide (MICROZIDE) 12.5 MG capsule Take 12.5 mg by mouth daily.   Yes [provider]  levothyroxine (SYNTHROID, LEVOTHROID) 50 MCG tablet Take 50 mcg by mouth daily before breakfast.   Yes [provider]  apixaban (ELIQUIS) 5 MG TABS tablet Take 1 tablet (5 mg total) by mouth 2 (two) times daily. 07/19/20   Little Ishikawa, MD  diltiazem (CARDIZEM CD) 240 MG 24 hr capsule Take 1 capsule (240 mg total) by mouth daily. 07/19/20   Little Ishikawa, MD  valACYclovir (VALTREX) 1000 MG tablet Take 1 tablet (1,000 mg total) by mouth 3 (three) times daily for 5 days. 07/19/20 07/24/20  Little Ishikawa, MD     Vital Signs: BP (!) 146/86 (BP Location: Right Arm)   Pulse 80   Temp 98 F (36.7 C) (Oral)   Resp 17   Ht 5\' 4"  (1.626 m)   Wt 152 lb 6.4 oz (69.1 kg)   SpO2 94%   BMI 26.16 kg/m   Physical Exam Vitals reviewed.  Constitutional:      General: She is not in acute distress. HENT:     Head: Normocephalic.  Cardiovascular:     Rate and Rhythm: Normal rate.  Pulmonary:     Effort: Pulmonary effort is normal.  Skin:    General: Skin is warm and dry.     Findings: Bruising (diffuse) present.     Comments: Left AC and surrounding tissue with multiple bruises in various stages of healing, soft tissue swelling to  inner portion of elbow which patient reports has been present for many years and is unchanged - no other areas of swelling noted. Left upper extremity grossly neurovascularly intact.  (+) left forearm IV present  Neurological:     Mental Status: She is alert. Mental status is at baseline.     Imaging: DG Chest 2 View  Result Date: 07/17/2020 CLINICAL DATA:  Back pain. EXAM: CHEST - 2 VIEW COMPARISON:  No pertinent prior exams are available for comparison. FINDINGS: Heart size within normal limits. Interstitial and ill-defined opacities at both lung bases. No evidence of pleural effusion or pneumothorax. Please refer to concurrently performed radiographs of the thoracic and lumbar spine for description of thoracolumbar findings. IMPRESSION: Interstitial and ill-defined opacities at both lung bases which may reflect interstitial lung disease. Pneumonia cannot be excluded. Electronically Signed   By: Kellie Simmering DO   On: 07/17/2020 07:15   DG Thoracic Spine 2 View  Result Date: 07/17/2020 CLINICAL DATA:  Back pain. EXAM: THORACIC SPINE 2 VIEWS COMPARISON:  Concurrently performed lumbar spine radiographs 07/17/2020. FINDINGS: Subtle levocurvature of the thoracic spine with partially imaged lumbar dextrocurvature. No significant spondylolisthesis. No appreciable thoracic vertebral compression fracture. Thoracic spondylosis with multilevel disc space narrowing, degenerative endplate sclerosis and ventral osteophytes. Suspect mild age-indeterminate compression deformities of the L2 and possibly  L1 vertebral bodies. IMPRESSION: No appreciable thoracic vertebral compression fracture. Thoracic spondylosis. Suspect mild age-indeterminate compression deformities of the L2 and possibly L1 vertebral bodies. Electronically Signed   By: Kellie Simmering DO   On: 07/17/2020 07:34   DG Lumbar Spine Complete  Result Date: 07/17/2020 CLINICAL DATA:  Back pain EXAM: LUMBAR SPINE - COMPLETE 4+ VIEW COMPARISON:  None.  FINDINGS: There is a curvature of the thoracolumbar spine which is convex towards the right centered around the T12-L1 level. The bones appear diffusely osteopenic. Multi level degenerative disc disease is noted particularly most advanced at T12-L1 and L1-2. Moderate degenerative disc disease is also noted at L5-S1. Bilateral facet arthropathy. Aortic atherosclerotic calcifications noted. IMPRESSION: 1. No acute findings. 2. Scoliosis and degenerative disc disease. 3.  Aortic Atherosclerosis (ICD10-I70.0). Electronically Signed   By: Kerby Moors M.D.   On: 07/17/2020 07:26   CT ANGIO CHEST PE W OR WO CONTRAST  Result Date: 07/17/2020 CLINICAL DATA:  Evaluate for acute pulmonary embolus. EXAM: CT ANGIOGRAPHY CHEST WITH CONTRAST TECHNIQUE: Multidetector CT imaging of the chest was performed using the standard protocol during bolus administration of intravenous contrast. Multiplanar CT image reconstructions and MIPs were obtained to evaluate the vascular anatomy. CONTRAST EXTRAVASATION CONSULTATION: Type of contrast:  Isovue 300 Site of extravasation: Left antecubital fossa Estimated volume of extravasation: 20 ml Area of extravasation scanned with CT? no PATIENT'S SIGNS AND SYMPTOMS Skin blistering/ulceration: no Decrease capillary refill: no Change in skin color: no Decreased motor function or severe tightness: no Decreased pulses distal to site of extravasation: no Altered sensation: no Increasing pain or signs of increased swelling during observation: no TREATMENT Observation period at site: Yes Limb elevation: yes Ice packs applied: yes Heat pads applied: yes Plastic surgery consulted? no DOCUMENTATION AND FOLLOW-UP Site contrast extravasation forms submitted? yes Post extravasation orders completed? yes Was additional follow up assigned to PA's? no Patient's questions answered? yes Patient instructed to call [phone] or seek immediate medical care if symptoms progress. CONTRAST:  42mL OMNIPAQUE IOHEXOL  350 MG/ML SOLN COMPARISON:  None. FINDINGS: Cardiovascular: Mild cardiac enlargement. No pericardial effusion. Aortic atherosclerosis. Coronary artery calcifications noted. Satisfactory opacification of the pulmonary arteries to the segmental level. Mediastinum/Nodes: Thyroid gland is not confidently identified and may be surgically absent or atrophic. The trachea appears patent and is midline. Normal appearance of the esophagus. No enlarged axillary, supraclavicular, or mediastinal adenopathy. Prominent subcarinal lymph node measures up to 1.2 cm. Enlarged right hilar lymph node has a short axis of 1.9 cm, image 78/5. Prominent left hilar lymph node measures 1.2 cm, image 69/5. Lungs/Pleura: Multifocal bilateral lower lung zone predominant areas of peripheral consolidation and ground-glass attenuation noted. Diffuse bronchial wall thickening is identified bilaterally. Multiple small millimetric lung nodules are scattered throughout both lungs. These all measure less than 5 mm. For example anterior right upper lobe nodule measures 2 mm, image 50/7. Upper Abdomen: Small left renal calculi. Aortic atherosclerosis. Low-attenuation enlargement of the adrenal glands noted favoring adenomatous hyperplasia. No acute abnormality noted. Musculoskeletal: Degenerative disc disease identified within the thoracic spine. Mild scoliosis with thoracic convexity towards the left. Review of the MIP images confirms the above findings. IMPRESSION: 1. No evidence for acute pulmonary embolus. 2. Multifocal bilateral lower lung zone predominant areas of consolidation and ground-glass attenuation compatible with inflammation/infection. Short-term follow-up imaging in 3 months is recommended following appropriate antibiotic therapy and resolution of any symptoms. 3. Coronary artery calcifications noted. 4. Bilateral hilar and mediastinal adenopathy. This is a nonspecific finding  in the setting of pneumonia and may be reactive in etiology.  Attention on 3 month follow-up chest CT is recommended to ensure resolution. 5. Scattered nonspecific milli metric lung nodules are noted bilaterally which are likely postinflammatory/infectious in etiology. Attention on follow-up imaging is advised. 6. Nonobstructing left renal calculi. 7. Aortic atherosclerosis. Aortic Atherosclerosis (ICD10-I70.0) and Emphysema (ICD10-J43.9). Electronically Signed   By: Kerby Moors M.D.   On: 07/17/2020 09:23   ECHOCARDIOGRAM COMPLETE  Result Date: 07/18/2020    ECHOCARDIOGRAM REPORT   Patient Name:   Peggy Williams Date of Exam: 07/18/2020 Medical Rec #:  [id number]   Height:       64.0 in Accession #:    [id number]  Weight:       152.4 lb Date of Birth:  [birth year]   BSA:          1.743 m Patient Age:    86 years    BP:           118/68 mmHg Patient Gender: F           HR:           74 bpm. Exam Location:  Inpatient Procedure: 2D Echo Indications:   atrial flutter 427.32  History:       Patient has no prior history of Echocardiogram examinations.                Stroke.  Sonographer:   Johny Chess Referring      [place] Phys: IMPRESSIONS  1. Left ventricular ejection fraction, by estimation, is 65 to 70%. The left ventricle has normal function. The left ventricle has no regional wall motion abnormalities. Left ventricular diastolic function could not be evaluated.  2. Right ventricular systolic function is normal. The right ventricular size is normal. There is mildly elevated pulmonary artery systolic pressure. The estimated right ventricular systolic pressure is 63.7 mmHg.  3. Left atrial size was mildly dilated.  4. The mitral valve is normal in structure. Mild to moderate mitral valve regurgitation. No evidence of mitral stenosis.  5. Tricuspid valve regurgitation is moderate.  6. The aortic valve is normal in structure. Aortic valve regurgitation is trivial. Mild to moderate aortic valve sclerosis/calcification is present, without any evidence of aortic  stenosis.  7. The inferior vena cava is normal in size with greater than 50% respiratory variability, suggesting right atrial pressure of 3 mmHg. FINDINGS  Left Ventricle: Left ventricular ejection fraction, by estimation, is 65 to 70%. The left ventricle has normal function. The left ventricle has no regional wall motion abnormalities. The left ventricular internal cavity size was normal in size. There is  no left ventricular hypertrophy. Left ventricular diastolic function could not be evaluated due to atrial fibrillation. Left ventricular diastolic function could not be evaluated. Right Ventricle: The right ventricular size is normal. No increase in right ventricular wall thickness. Right ventricular systolic function is normal. There is mildly elevated pulmonary artery systolic pressure. The tricuspid regurgitant velocity is 3.06  m/s, and with an assumed right atrial pressure of 3 mmHg, the estimated right ventricular systolic pressure is 85.8 mmHg. Left Atrium: Left atrial size was mildly dilated. Right Atrium: Right atrial size was normal in size. Pericardium: There is no evidence of pericardial effusion. Mitral Valve: The mitral valve is normal in structure. Mild to moderate mitral valve regurgitation. No evidence of mitral valve stenosis. Tricuspid Valve: The tricuspid valve is normal in structure. Tricuspid valve regurgitation is moderate . No evidence of  tricuspid stenosis. Aortic Valve: The aortic valve is normal in structure. Aortic valve regurgitation is trivial. Mild to moderate aortic valve sclerosis/calcification is present, without any evidence of aortic stenosis. Pulmonic Valve: The pulmonic valve was normal in structure. Pulmonic valve regurgitation is mild. No evidence of pulmonic stenosis. Aorta: The aortic root is normal in size and structure. Venous: The inferior vena cava is normal in size with greater than 50% respiratory variability, suggesting right atrial pressure of 3 mmHg. IAS/Shunts:  No atrial level shunt detected by color flow Doppler.  LEFT VENTRICLE PLAX 2D LVIDd:         4.50 cm LVIDs:         2.90 cm LV PW:         1.00 cm LV IVS:        0.90 cm LVOT diam:     2.00 cm LV SV:         44 LV SV Index:   25 LVOT Area:     3.14 cm  LEFT ATRIUM             Index       RIGHT ATRIUM           Index LA diam:        4.10 cm 2.35 cm/m  RA Area:     11.50 cm LA Vol (A2C):   44.5 ml 25.53 ml/m RA Volume:   24.90 ml  14.29 ml/m LA Vol (A4C):   50.6 ml 29.03 ml/m LA Biplane Vol: 49.8 ml 28.57 ml/m  AORTIC VALVE LVOT Vmax:   61.40 cm/s LVOT Vmean:  46.100 cm/s LVOT VTI:    0.139 m  AORTA Ao Root diam: 3.20 cm Ao Asc diam:  2.90 cm TRICUSPID VALVE TR Peak grad:   37.5 mmHg TR Vmax:        306.00 cm/s  SHUNTS Systemic VTI:  0.14 m Systemic Diam: 2.00 cm Ena Dawley MD Electronically signed by Ena Dawley MD Signature Date/Time: 07/18/2020/12:50:37 PM    Final     Labs:  CBC: Recent Labs    07/17/20 0420 07/18/20 0249 07/19/20 0551  WBC 9.8 10.2 9.0  HGB 15.7* 14.8 14.9  HCT 48.4* 46.0 45.8  PLT 252 243 241    COAGS: No results for input(s): INR, APTT in the last 8760 hours.  BMP: Recent Labs    07/17/20 0420 07/19/20 0551  NA 142 141  K 5.0 4.2  CL 104 107  CO2 28 26  GLUCOSE 112* 106*  BUN 12 9  CALCIUM 9.5 9.3  CREATININE 1.08* 0.86  GFRNONAA 49* >60    LIVER FUNCTION TESTS: Recent Labs    07/17/20 0420  BILITOT 0.8  AST 22  ALT 19  ALKPHOS 53  PROT 6.6  ALBUMIN 3.6    Assessment and Plan:  86 y/o F seen for follow up of left AC contrast extravasation on 11/6.   Patient denies any complaints, she does have diffuse bruising to her left upper extremity however these are in various stages of healing and likely not directly related to the extravasation. Neurovascularly intact without any subjective numbness/tingling. There is an area of soft tissue swelling on the inner portion of her elbow which she reports has been present for many years and  is unchanged.  No sequela from contrast extravasation noted on today's exam - she is planned for discharge today. No further treatment of contrast extravasation indicated at this time, she was encouraged to monitor for any  changes and call us if she has any further concerns.  Electronically Signed: Joaquim Nam, PA-C 07/19/2020, 11:15 AM   I spent a total of 15 Minutes at the the patient's bedside AND on the patient's hospital floor or unit, greater than 50% of which was counseling/coordinating care for left upper extremity contrast extravasation follow up.

## 2020-07-19 NOTE — Discharge Summary (Signed)
Physician Discharge Summary  Peggy Williams UOH:[id number] DOB: 1929-01-01 DOA: 07/17/2020  PCP: Lajean Manes, MD  Admit date: 07/17/2020 Discharge date: 07/19/2020  Admitted From: Assisted living Disposition: Assisted living facility  Recommendations for Outpatient Follow-up:  1. Follow up with PCP in 1-2 weeks 2. Follow-up with cardiology in 1 to 2 weeks as scheduled  Home Health: None Equipment/Devices: None  Discharge Condition: Stable CODE STATUS: DNR Diet recommendation: Low-salt low-fat diet  Brief/Interim Summary: Peggy Williams a 86 y.o.femalewith medical history significant ofhypothyroidism andvery remoteaneurysmwith post-procedureCVA(has chronic word-finding difficulties)presenting with back pain.She reports that she is here because of her back. She lives independently, exercises daily. She hurt her back Tuesday, treating it with Naproxen. Last night it worsened. The nurse came to see her and she was tachycardic with BP elevation and they sent her in. She has not noticed CP or SOB. No dizziness or falls. She has consistently said no SOB but her O2 sats have dropped periodically mostly with sleep. No h/o afib/aflutter. In ED: Patient appears to have aflutter, not very responsive to Dilt drip - ?TEE conversion on Monday, cardiology to see. Troponins gradually rising, likely rate related. Mild O2 need - CTA negative for PE, ?B LL infiltrates, COVID negative. Started on antibiotics in case of PNA but no leukocytosis. HR in 120s now. Was given boluses x 2 - ?overload. Also with ?shingles outbreak on L lower chest.  Patient met as above with acute asymptomatic flutter and left posterior back pain concerning for shingles versus musculoskeletal etiology.  Cardiology following, patient was transitioned from diltiazem drip and heparin drip to p.o. diltiazem and Eliquis weightbase at 5 mg twice daily.  Patient will be continued on Valtrex for questionable shingles given  left thoracic chest wall and vesicles and back pain.  Patient need close follow-up with PCP and cardiology as scheduled but otherwise is asymptomatic denies chest pain, shortness of breath nausea vomiting diarrhea constipation headache fevers or chills and is otherwise stable and agreeable for discharge back to facility, son at bedside concurs patient appears well and is willing to transport patient himself back to facility today once cleared by cardiology.   Discharge Diagnoses:  Principal Problem:   New onset atrial flutter ([place]) Active Problems:   History of CVA (cerebrovascular accident)   Shingles   DNR (do not resuscitate)    Discharge Instructions   Allergies as of 07/19/2020   No Known Allergies     Medication List    STOP taking these medications   aspirin 81 MG tablet   hydrochlorothiazide 12.5 MG capsule Commonly known as: MICROZIDE     TAKE these medications   apixaban 5 MG Tabs tablet Commonly known as: ELIQUIS Take 1 tablet (5 mg total) by mouth 2 (two) times daily.   diltiazem 240 MG 24 hr capsule Commonly known as: CARDIZEM CD Take 1 capsule (240 mg total) by mouth daily.   levothyroxine 50 MCG tablet Commonly known as: SYNTHROID Take 50 mcg by mouth daily before breakfast.   valACYclovir 1000 MG tablet Commonly known as: VALTREX Take 1 tablet (1,000 mg total) by mouth 3 (three) times daily for 5 days.       No Known Allergies  Consultations:  Cardiology   Procedures/Studies: DG Chest 2 View  Result Date: 07/17/2020 CLINICAL DATA:  Back pain. EXAM: CHEST - 2 VIEW COMPARISON:  No pertinent prior exams are available for comparison. FINDINGS: Heart size within normal limits. Interstitial and ill-defined opacities at both lung bases. No evidence of pleural  effusion or pneumothorax. Please refer to concurrently performed radiographs of the thoracic and lumbar spine for description of thoracolumbar findings. IMPRESSION: Interstitial and ill-defined  opacities at both lung bases which may reflect interstitial lung disease. Pneumonia cannot be excluded. Electronically Signed   By: Kellie Simmering DO   On: 07/17/2020 07:15   DG Thoracic Spine 2 View  Result Date: 07/17/2020 CLINICAL DATA:  Back pain. EXAM: THORACIC SPINE 2 VIEWS COMPARISON:  Concurrently performed lumbar spine radiographs 07/17/2020. FINDINGS: Subtle levocurvature of the thoracic spine with partially imaged lumbar dextrocurvature. No significant spondylolisthesis. No appreciable thoracic vertebral compression fracture. Thoracic spondylosis with multilevel disc space narrowing, degenerative endplate sclerosis and ventral osteophytes. Suspect mild age-indeterminate compression deformities of the L2 and possibly L1 vertebral bodies. IMPRESSION: No appreciable thoracic vertebral compression fracture. Thoracic spondylosis. Suspect mild age-indeterminate compression deformities of the L2 and possibly L1 vertebral bodies. Electronically Signed   By: Kellie Simmering DO   On: 07/17/2020 07:34   DG Lumbar Spine Complete  Result Date: 07/17/2020 CLINICAL DATA:  Back pain EXAM: LUMBAR SPINE - COMPLETE 4+ VIEW COMPARISON:  None. FINDINGS: There is a curvature of the thoracolumbar spine which is convex towards the right centered around the T12-L1 level. The bones appear diffusely osteopenic. Multi level degenerative disc disease is noted particularly most advanced at T12-L1 and L1-2. Moderate degenerative disc disease is also noted at L5-S1. Bilateral facet arthropathy. Aortic atherosclerotic calcifications noted. IMPRESSION: 1. No acute findings. 2. Scoliosis and degenerative disc disease. 3.  Aortic Atherosclerosis (ICD10-I70.0). Electronically Signed   By: Kerby Moors M.D.   On: 07/17/2020 07:26   CT ANGIO CHEST PE W OR WO CONTRAST  Result Date: 07/17/2020 CLINICAL DATA:  Evaluate for acute pulmonary embolus. EXAM: CT ANGIOGRAPHY CHEST WITH CONTRAST TECHNIQUE: Multidetector CT imaging of the chest  was performed using the standard protocol during bolus administration of intravenous contrast. Multiplanar CT image reconstructions and MIPs were obtained to evaluate the vascular anatomy. CONTRAST EXTRAVASATION CONSULTATION: Type of contrast:  Isovue 300 Site of extravasation: Left antecubital fossa Estimated volume of extravasation: 20 ml Area of extravasation scanned with CT? no PATIENT'S SIGNS AND SYMPTOMS Skin blistering/ulceration: no Decrease capillary refill: no Change in skin color: no Decreased motor function or severe tightness: no Decreased pulses distal to site of extravasation: no Altered sensation: no Increasing pain or signs of increased swelling during observation: no TREATMENT Observation period at site: Yes Limb elevation: yes Ice packs applied: yes Heat pads applied: yes Plastic surgery consulted? no DOCUMENTATION AND FOLLOW-UP Site contrast extravasation forms submitted? yes Post extravasation orders completed? yes Was additional follow up assigned to PA's? no Patient's questions answered? yes Patient instructed to call [phone] or seek immediate medical care if symptoms progress. CONTRAST:  31m OMNIPAQUE IOHEXOL 350 MG/ML SOLN COMPARISON:  None. FINDINGS: Cardiovascular: Mild cardiac enlargement. No pericardial effusion. Aortic atherosclerosis. Coronary artery calcifications noted. Satisfactory opacification of the pulmonary arteries to the segmental level. Mediastinum/Nodes: Thyroid gland is not confidently identified and may be surgically absent or atrophic. The trachea appears patent and is midline. Normal appearance of the esophagus. No enlarged axillary, supraclavicular, or mediastinal adenopathy. Prominent subcarinal lymph node measures up to 1.2 cm. Enlarged right hilar lymph node has a short axis of 1.9 cm, image 78/5. Prominent left hilar lymph node measures 1.2 cm, image 69/5. Lungs/Pleura: Multifocal bilateral lower lung zone predominant areas of peripheral consolidation and  ground-glass attenuation noted. Diffuse bronchial wall thickening is identified bilaterally. Multiple small millimetric lung nodules are scattered  throughout both lungs. These all measure less than 5 mm. For example anterior right upper lobe nodule measures 2 mm, image 50/7. Upper Abdomen: Small left renal calculi. Aortic atherosclerosis. Low-attenuation enlargement of the adrenal glands noted favoring adenomatous hyperplasia. No acute abnormality noted. Musculoskeletal: Degenerative disc disease identified within the thoracic spine. Mild scoliosis with thoracic convexity towards the left. Review of the MIP images confirms the above findings. IMPRESSION: 1. No evidence for acute pulmonary embolus. 2. Multifocal bilateral lower lung zone predominant areas of consolidation and ground-glass attenuation compatible with inflammation/infection. Short-term follow-up imaging in 3 months is recommended following appropriate antibiotic therapy and resolution of any symptoms. 3. Coronary artery calcifications noted. 4. Bilateral hilar and mediastinal adenopathy. This is a nonspecific finding in the setting of pneumonia and may be reactive in etiology. Attention on 3 month follow-up chest CT is recommended to ensure resolution. 5. Scattered nonspecific milli metric lung nodules are noted bilaterally which are likely postinflammatory/infectious in etiology. Attention on follow-up imaging is advised. 6. Nonobstructing left renal calculi. 7. Aortic atherosclerosis. Aortic Atherosclerosis (ICD10-I70.0) and Emphysema (ICD10-J43.9). Electronically Signed   By: Kerby Moors M.D.   On: 07/17/2020 09:23   ECHOCARDIOGRAM COMPLETE  Result Date: 07/18/2020    ECHOCARDIOGRAM REPORT   Patient Name:   Peggy Williams Date of Exam: 07/18/2020 Medical Rec #:  [id number]   Height:       64.0 in Accession #:    [id number]  Weight:       152.4 lb Date of Birth:  Dec 24, 1928   BSA:          1.743 m Patient Age:    26 years    BP:           118/68  mmHg Patient Gender: F           HR:           74 bpm. Exam Location:  Inpatient Procedure: 2D Echo Indications:   atrial flutter 427.32  History:       Patient has no prior history of Echocardiogram examinations.                Stroke.  Sonographer:   Johny Chess Referring      [place] Phys: IMPRESSIONS  1. Left ventricular ejection fraction, by estimation, is 65 to 70%. The left ventricle has normal function. The left ventricle has no regional wall motion abnormalities. Left ventricular diastolic function could not be evaluated.  2. Right ventricular systolic function is normal. The right ventricular size is normal. There is mildly elevated pulmonary artery systolic pressure. The estimated right ventricular systolic pressure is 99.3 mmHg.  3. Left atrial size was mildly dilated.  4. The mitral valve is normal in structure. Mild to moderate mitral valve regurgitation. No evidence of mitral stenosis.  5. Tricuspid valve regurgitation is moderate.  6. The aortic valve is normal in structure. Aortic valve regurgitation is trivial. Mild to moderate aortic valve sclerosis/calcification is present, without any evidence of aortic stenosis.  7. The inferior vena cava is normal in size with greater than 50% respiratory variability, suggesting right atrial pressure of 3 mmHg. FINDINGS  Left Ventricle: Left ventricular ejection fraction, by estimation, is 65 to 70%. The left ventricle has normal function. The left ventricle has no regional wall motion abnormalities. The left ventricular internal cavity size was normal in size. There is  no left ventricular hypertrophy. Left ventricular diastolic function could not be evaluated due to atrial fibrillation. Left ventricular  diastolic function could not be evaluated. Right Ventricle: The right ventricular size is normal. No increase in right ventricular wall thickness. Right ventricular systolic function is normal. There is mildly elevated pulmonary artery  systolic pressure. The tricuspid regurgitant velocity is 3.06  m/s, and with an assumed right atrial pressure of 3 mmHg, the estimated right ventricular systolic pressure is 31.4 mmHg. Left Atrium: Left atrial size was mildly dilated. Right Atrium: Right atrial size was normal in size. Pericardium: There is no evidence of pericardial effusion. Mitral Valve: The mitral valve is normal in structure. Mild to moderate mitral valve regurgitation. No evidence of mitral valve stenosis. Tricuspid Valve: The tricuspid valve is normal in structure. Tricuspid valve regurgitation is moderate . No evidence of tricuspid stenosis. Aortic Valve: The aortic valve is normal in structure. Aortic valve regurgitation is trivial. Mild to moderate aortic valve sclerosis/calcification is present, without any evidence of aortic stenosis. Pulmonic Valve: The pulmonic valve was normal in structure. Pulmonic valve regurgitation is mild. No evidence of pulmonic stenosis. Aorta: The aortic root is normal in size and structure. Venous: The inferior vena cava is normal in size with greater than 50% respiratory variability, suggesting right atrial pressure of 3 mmHg. IAS/Shunts: No atrial level shunt detected by color flow Doppler.  LEFT VENTRICLE PLAX 2D LVIDd:         4.50 cm LVIDs:         2.90 cm LV PW:         1.00 cm LV IVS:        0.90 cm LVOT diam:     2.00 cm LV SV:         44 LV SV Index:   25 LVOT Area:     3.14 cm  LEFT ATRIUM             Index       RIGHT ATRIUM           Index LA diam:        4.10 cm 2.35 cm/m  RA Area:     11.50 cm LA Vol (A2C):   44.5 ml 25.53 ml/m RA Volume:   24.90 ml  14.29 ml/m LA Vol (A4C):   50.6 ml 29.03 ml/m LA Biplane Vol: 49.8 ml 28.57 ml/m  AORTIC VALVE LVOT Vmax:   61.40 cm/s LVOT Vmean:  46.100 cm/s LVOT VTI:    0.139 m  AORTA Ao Root diam: 3.20 cm Ao Asc diam:  2.90 cm TRICUSPID VALVE TR Peak grad:   37.5 mmHg TR Vmax:        306.00 cm/s  SHUNTS Systemic VTI:  0.14 m Systemic Diam: 2.00 cm  Ena Dawley MD Electronically signed by Ena Dawley MD Signature Date/Time: 07/18/2020/12:50:37 PM    Final       Subjective: No acute issues or events overnight denies chest pain, palpitations, nausea, vomiting, diarrhea, constipation, headache, fevers, chills.   Discharge Exam: Vitals:   07/19/20 0540 07/19/20 0754  BP: (!) 148/100 (!) 146/86  Pulse: (!) 129 80  Resp: 18 17  Temp: 98.1 F (36.7 C) 98 F (36.7 C)  SpO2: 92% 94%   Vitals:   07/18/20 2135 07/18/20 2335 07/19/20 0540 07/19/20 0754  BP: (!) 146/85 130/81 (!) 148/100 (!) 146/86  Pulse:  (!) 130 (!) 129 80  Resp:  14 18 17   Temp:  97.7 F (36.5 C) 98.1 F (36.7 C) 98 F (36.7 C)  TempSrc:  Oral Oral Oral  SpO2:  92% 94%  Weight:      Height:        General:  Pleasantly resting in bed, No acute distress. HEENT:  Normocephalic atraumatic.  Sclerae nonicteric, noninjected.  Extraocular movements intact bilaterally. Neck:  Without mass or deformity.  Trachea is midline. Lungs:  Clear to auscultate bilaterally without rhonchi, wheeze, or rales. Heart:  Regular rate and rhythm.  Without murmurs, rubs, or gallops. Abdomen:  Soft, nontender, nondistended.  Without guarding or rebound. Extremities: Without cyanosis, clubbing, edema, or obvious deformity. Vascular:  Dorsalis pedis and posterior tibial pulses palpable bilaterally. Skin:  Warm and dry, no erythema, no ulcerations.  Small 1 cm diameter area of erythematous vesicles at left lateral chest wall.     The results of significant diagnostics from this hospitalization (including imaging, microbiology, ancillary and laboratory) are listed below for reference.     Microbiology: Recent Results (from the past 240 hour(s))  Respiratory Panel by RT PCR (Flu A&B, Covid) - Nasopharyngeal Swab     Status: None   Collection Time: 07/17/20  9:15 AM   Specimen: Nasopharyngeal Swab  Result Value Ref Range Status   SARS Coronavirus 2 by RT PCR NEGATIVE  NEGATIVE Final    Comment: (NOTE) SARS-CoV-2 target nucleic acids are NOT DETECTED.  The SARS-CoV-2 RNA is generally detectable in upper respiratoy specimens during the acute phase of infection. The lowest concentration of SARS-CoV-2 viral copies this assay can detect is 131 copies/mL. A negative result does not preclude SARS-Cov-2 infection and should not be used as the sole basis for treatment or other patient management decisions. A negative result may occur with  improper specimen collection/handling, submission of specimen other than nasopharyngeal swab, presence of viral mutation(s) within the areas targeted by this assay, and inadequate number of viral copies (<131 copies/mL). A negative result must be combined with clinical observations, patient history, and epidemiological information. The expected result is Negative.  Fact Sheet for Patients:  PinkCheek.be  Fact Sheet for Healthcare Providers:  GravelBags.it  This test is no t yet approved or cleared by the [place] FDA and  has been authorized for detection and/or diagnosis of SARS-CoV-2 by FDA under an Emergency Use Authorization (EUA). This EUA will remain  in effect (meaning this test can be used) for the duration of the COVID-19 declaration under Section 564(b)(1) of the Act, 21 U.S.C. section 360bbb-3(b)(1), unless the authorization is terminated or revoked sooner.     Influenza A by PCR NEGATIVE NEGATIVE Final   Influenza B by PCR NEGATIVE NEGATIVE Final    Comment: (NOTE) The Xpert Xpress SARS-CoV-2/FLU/RSV assay is intended as an aid in  the diagnosis of influenza from Nasopharyngeal swab specimens and  should not be used as a sole basis for treatment. Nasal washings and  aspirates are unacceptable for Xpert Xpress SARS-CoV-2/FLU/RSV  testing.  Fact Sheet for Patients: PinkCheek.be  Fact Sheet for Healthcare  Providers: GravelBags.it  This test is not yet approved or cleared by the [place] FDA and  has been authorized for detection and/or diagnosis of SARS-CoV-2 by  FDA under an Emergency Use Authorization (EUA). This EUA will remain  in effect (meaning this test can be used) for the duration of the  Covid-19 declaration under Section 564(b)(1) of the Act, 21  U.S.C. section 360bbb-3(b)(1), unless the authorization is  terminated or revoked. Performed at [facility], [place] [address] [place], [place] [address]      Labs: BNP (last 3 results) Recent Labs  07/18/20 1451  BNP 811.0*   Basic Metabolic Panel: Recent Labs  Lab 07/17/20 0420 07/19/20 0551  NA 142 141  K 5.0 4.2  CL 104 107  CO2 28 26  GLUCOSE 112* 106*  BUN 12 9  CREATININE 1.08* 0.86  CALCIUM 9.5 9.3   Liver Function Tests: Recent Labs  Lab 07/17/20 0420  AST 22  ALT 19  ALKPHOS 53  BILITOT 0.8  PROT 6.6  ALBUMIN 3.6   Recent Labs  Lab 07/17/20 0420  LIPASE 46   No results for input(s): AMMONIA in the last 168 hours. CBC: Recent Labs  Lab 07/17/20 0420 07/18/20 0249 07/19/20 0551  WBC 9.8 10.2 9.0  NEUTROABS 6.7  --   --   HGB 15.7* 14.8 14.9  HCT 48.4* 46.0 45.8  MCV 96.8 95.2 94.2  PLT 252 243 241   Cardiac Enzymes: No results for input(s): CKTOTAL, CKMB, CKMBINDEX, TROPONINI in the last 168 hours. BNP: Invalid input(s): POCBNP CBG: No results for input(s): GLUCAP in the last 168 hours. D-Dimer No results for input(s): DDIMER in the last 72 hours. Hgb A1c No results for input(s): HGBA1C in the last 72 hours. Lipid Profile No results for input(s): CHOL, HDL, LDLCALC, TRIG, CHOLHDL, LDLDIRECT in the last 72 hours. Thyroid function studies Recent Labs    07/17/20 1500  TSH 1.595   Anemia work up No results for input(s): VITAMINB12, FOLATE, FERRITIN, TIBC, IRON, RETICCTPCT in the last 72 hours. Urinalysis No results found for:  COLORURINE, APPEARANCEUR, [place], [place], [place], [place], [place], [place], PROTEINUR, UROBILINOGEN, NITRITE, LEUKOCYTESUR Sepsis Labs Invalid input(s): PROCALCITONIN,  WBC,  LACTICIDVEN Microbiology Recent Results (from the past 240 hour(s))  Respiratory Panel by RT PCR (Flu A&B, Covid) - Nasopharyngeal Swab     Status: None   Collection Time: 07/17/20  9:15 AM   Specimen: Nasopharyngeal Swab  Result Value Ref Range Status   SARS Coronavirus 2 by RT PCR NEGATIVE NEGATIVE Final    Comment: (NOTE) SARS-CoV-2 target nucleic acids are NOT DETECTED.  The SARS-CoV-2 RNA is generally detectable in upper respiratoy specimens during the acute phase of infection. The lowest concentration of SARS-CoV-2 viral copies this assay can detect is 131 copies/mL. A negative result does not preclude SARS-Cov-2 infection and should not be used as the sole basis for treatment or other patient management decisions. A negative result may occur with  improper specimen collection/handling, submission of specimen other than nasopharyngeal swab, presence of viral mutation(s) within the areas targeted by this assay, and inadequate number of viral copies (<131 copies/mL). A negative result must be combined with clinical observations, patient history, and epidemiological information. The expected result is Negative.  Fact Sheet for Patients:  PinkCheek.be  Fact Sheet for Healthcare Providers:  GravelBags.it  This test is no t yet approved or cleared by the [place] FDA and  has been authorized for detection and/or diagnosis of SARS-CoV-2 by FDA under an Emergency Use Authorization (EUA). This EUA will remain  in effect (meaning this test can be used) for the duration of the COVID-19 declaration under Section 564(b)(1) of the Act, 21 U.S.C. section 360bbb-3(b)(1), unless the authorization is terminated or revoked sooner.     Influenza A  by PCR NEGATIVE NEGATIVE Final   Influenza B by PCR NEGATIVE NEGATIVE Final    Comment: (NOTE) The Xpert Xpress SARS-CoV-2/FLU/RSV assay is intended as an aid in  the diagnosis of influenza from Nasopharyngeal swab specimens and  should not be used as a sole basis  for treatment. Nasal washings and  aspirates are unacceptable for Xpert Xpress SARS-CoV-2/FLU/RSV  testing.  Fact Sheet for Patients: PinkCheek.be  Fact Sheet for Healthcare Providers: GravelBags.it  This test is not yet approved or cleared by the [place] FDA and  has been authorized for detection and/or diagnosis of SARS-CoV-2 by  FDA under an Emergency Use Authorization (EUA). This EUA will remain  in effect (meaning this test can be used) for the duration of the  Covid-19 declaration under Section 564(b)(1) of the Act, 21  U.S.C. section 360bbb-3(b)(1), unless the authorization is  terminated or revoked. Performed at [facility], [place] [address] [place], [place] [address]      Time coordinating discharge: Over 30 minutes  SIGNED:   Little Ishikawa, DO [facility] 07/19/2020, 9:06 AM Pager   If 7PM-7AM, please contact night-coverage [email or web address]

## 2020-07-19 NOTE — TOC Transition Note (Signed)
Transition of Care [facility]) - CM/SW Discharge Note   Patient Details  Name: Peggy Williams MRN: [id number] Date of Birth: 1929-01-21  [facility] CM/SW Contact:  Zenon Mayo, RN Phone Number: 07/19/2020, 12:01 PM   Clinical Narrative:    Patient is from IDL at [facility], she is for dc today, NCM spoke with the son in the room, informed him about the 47.00 copay for eliquis. TOC filled the medications and they were brought to patient's room.    Final next level of care: Home/Self Care Barriers to Discharge: No Barriers Identified   Patient Goals and CMS Choice        Discharge Placement                       Discharge Plan and Services                                     Social Determinants of Health (SDOH) Interventions     Readmission Risk Interventions No flowsheet data found.

## 2020-07-19 NOTE — TOC Benefit Eligibility Note (Signed)
Transition of Care [facility]) Benefit Eligibility Note    Patient Details  Name: Peggy Williams MRN: [id number] Date of Birth: April 12, 1929   Medication/Dose: Noah Delaine 2.5mg  and or 5mg . bid for 30 day supply  Covered?: Yes  Tier: 3 Drug  Prescription Coverage [facility]: [facility]  Spoke with Person/Company/Phone Number:: Cess A. W/[employer],PH# [phone]  Co-Pay: $45.00  Prior Approval: No  Deductible:  (No deductible on this plan.)       Shelda Altes Phone Number: 07/19/2020, 1:07 PM

## 2020-07-19 NOTE — Progress Notes (Signed)
Progress Note  Patient Name: Peggy Williams Date of Encounter: 07/19/2020  [facility] Cardiologist: Dr. Harl Bowie (lives in [place])  Subjective   Converted to sinus rhythm just before 8 AM this morning. Son (who is a PA in [place]) at bedside. She is feeling well beyond her shingles pain. Has not had any symptoms in her atrial flutter.  Inpatient Medications    Scheduled Meds: . apixaban  5 mg Oral BID  . diltiazem  240 mg Oral Daily  . levothyroxine  50 mcg Oral QAC breakfast  . valACYclovir  1,000 mg Oral TID   Continuous Infusions:  PRN Meds: acetaminophen, ondansetron (ZOFRAN) IV, oxyCODONE   Vital Signs    Vitals:   07/18/20 2135 07/18/20 2335 07/19/20 0540 07/19/20 0754  BP: (!) 146/85 130/81 (!) 148/100 (!) 146/86  Pulse:  (!) 130 (!) 129 80  Resp:  14 18 17   Temp:  97.7 F (36.5 C) 98.1 F (36.7 C) 98 F (36.7 C)  TempSrc:  Oral Oral Oral  SpO2:   92% 94%  Weight:      Height:        Intake/Output Summary (Last 24 hours) at 07/19/2020 0905 Last data filed at 07/19/2020 0810 Gross per 24 hour  Intake 679.19 ml  Output 1300 ml  Net -620.81 ml   Last 3 Weights 07/18/2020 07/18/2020 07/17/2020  Weight (lbs) 152 lb 6.4 oz 153 lb 6.4 oz 149 lb 14.6 oz  Weight (kg) 69.128 kg 69.582 kg 68 kg      Telemetry    Converted to sinus rhythm just before 8 AM this morning - Personally Reviewed  ECG    New ECG ordered given rhythm change - Personally Reviewed  Physical Exam   GEN: No acute distress.   Neck: No JVD Cardiac: RRR, no murmurs, rubs, or gallops.  Respiratory: Clear to auscultation bilaterally. GI: Soft, nontender, non-distended  MS: No edema; No deformity. Neuro:  Nonfocal  Psych: Normal affect   Labs    High Sensitivity Troponin:   Recent Labs  Lab 07/17/20 0420 07/17/20 0710 07/17/20 0910 07/17/20 1150  TROPONINIHS 10 35* 51* 35*      Chemistry Recent Labs  Lab 07/17/20 0420 07/19/20 0551  NA 142 141  K 5.0 4.2  CL  104 107  CO2 28 26  GLUCOSE 112* 106*  BUN 12 9  CREATININE 1.08* 0.86  CALCIUM 9.5 9.3  PROT 6.6  --   ALBUMIN 3.6  --   AST 22  --   ALT 19  --   ALKPHOS 53  --   BILITOT 0.8  --   GFRNONAA 49* >60  ANIONGAP 10 8     Hematology Recent Labs  Lab 07/17/20 0420 07/18/20 0249 07/19/20 0551  WBC 9.8 10.2 9.0  RBC 5.00 4.83 4.86  HGB 15.7* 14.8 14.9  HCT 48.4* 46.0 45.8  MCV 96.8 95.2 94.2  MCH 31.4 30.6 30.7  MCHC 32.4 32.2 32.5  RDW 14.4 14.6 14.2  PLT 252 243 241    BNP Recent Labs  Lab 07/18/20 1451  BNP 516.9*     DDimer No results for input(s): DDIMER in the last 168 hours.   Radiology    ECHOCARDIOGRAM COMPLETE  Result Date: 07/18/2020    ECHOCARDIOGRAM REPORT   Patient Name:   Peggy Williams Date of Exam: 07/18/2020 Medical Rec #:  [id number]   Height:       64.0 in Accession #:    [id number]  Weight:  152.4 lb Date of Birth:  [birth year]   BSA:          1.743 m Patient Age:    86 years    BP:           118/68 mmHg Patient Gender: F           HR:           74 bpm. Exam Location:  Inpatient Procedure: 2D Echo Indications:   atrial flutter 427.32  History:       Patient has no prior history of Echocardiogram examinations.                Stroke.  Sonographer:   Johny Chess Referring      [place] Phys: IMPRESSIONS  1. Left ventricular ejection fraction, by estimation, is 65 to 70%. The left ventricle has normal function. The left ventricle has no regional wall motion abnormalities. Left ventricular diastolic function could not be evaluated.  2. Right ventricular systolic function is normal. The right ventricular size is normal. There is mildly elevated pulmonary artery systolic pressure. The estimated right ventricular systolic pressure is 93.2 mmHg.  3. Left atrial size was mildly dilated.  4. The mitral valve is normal in structure. Mild to moderate mitral valve regurgitation. No evidence of mitral stenosis.  5. Tricuspid valve regurgitation is  moderate.  6. The aortic valve is normal in structure. Aortic valve regurgitation is trivial. Mild to moderate aortic valve sclerosis/calcification is present, without any evidence of aortic stenosis.  7. The inferior vena cava is normal in size with greater than 50% respiratory variability, suggesting right atrial pressure of 3 mmHg. FINDINGS  Left Ventricle: Left ventricular ejection fraction, by estimation, is 65 to 70%. The left ventricle has normal function. The left ventricle has no regional wall motion abnormalities. The left ventricular internal cavity size was normal in size. There is  no left ventricular hypertrophy. Left ventricular diastolic function could not be evaluated due to atrial fibrillation. Left ventricular diastolic function could not be evaluated. Right Ventricle: The right ventricular size is normal. No increase in right ventricular wall thickness. Right ventricular systolic function is normal. There is mildly elevated pulmonary artery systolic pressure. The tricuspid regurgitant velocity is 3.06  m/s, and with an assumed right atrial pressure of 3 mmHg, the estimated right ventricular systolic pressure is 67.1 mmHg. Left Atrium: Left atrial size was mildly dilated. Right Atrium: Right atrial size was normal in size. Pericardium: There is no evidence of pericardial effusion. Mitral Valve: The mitral valve is normal in structure. Mild to moderate mitral valve regurgitation. No evidence of mitral valve stenosis. Tricuspid Valve: The tricuspid valve is normal in structure. Tricuspid valve regurgitation is moderate . No evidence of tricuspid stenosis. Aortic Valve: The aortic valve is normal in structure. Aortic valve regurgitation is trivial. Mild to moderate aortic valve sclerosis/calcification is present, without any evidence of aortic stenosis. Pulmonic Valve: The pulmonic valve was normal in structure. Pulmonic valve regurgitation is mild. No evidence of pulmonic stenosis. Aorta: The aortic  root is normal in size and structure. Venous: The inferior vena cava is normal in size with greater than 50% respiratory variability, suggesting right atrial pressure of 3 mmHg. IAS/Shunts: No atrial level shunt detected by color flow Doppler.  LEFT VENTRICLE PLAX 2D LVIDd:         4.50 cm LVIDs:         2.90 cm LV PW:  1.00 cm LV IVS:        0.90 cm LVOT diam:     2.00 cm LV SV:         44 LV SV Index:   25 LVOT Area:     3.14 cm  LEFT ATRIUM             Index       RIGHT ATRIUM           Index LA diam:        4.10 cm 2.35 cm/m  RA Area:     11.50 cm LA Vol (A2C):   44.5 ml 25.53 ml/m RA Volume:   24.90 ml  14.29 ml/m LA Vol (A4C):   50.6 ml 29.03 ml/m LA Biplane Vol: 49.8 ml 28.57 ml/m  AORTIC VALVE LVOT Vmax:   61.40 cm/s LVOT Vmean:  46.100 cm/s LVOT VTI:    0.139 m  AORTA Ao Root diam: 3.20 cm Ao Asc diam:  2.90 cm TRICUSPID VALVE TR Peak grad:   37.5 mmHg TR Vmax:        306.00 cm/s  SHUNTS Systemic VTI:  0.14 m Systemic Diam: 2.00 cm Ena Dawley MD Electronically signed by Ena Dawley MD Signature Date/Time: 07/18/2020/12:50:37 PM    Final     Cardiac Studies   Echo 07/18/20 1. Left ventricular ejection fraction, by estimation, is 65 to 70%. The  left ventricle has normal function. The left ventricle has no regional  wall motion abnormalities. Left ventricular diastolic function could not  be evaluated.  2. Right ventricular systolic function is normal. The right ventricular  size is normal. There is mildly elevated pulmonary artery systolic  pressure. The estimated right ventricular systolic pressure is 13.0 mmHg.  3. Left atrial size was mildly dilated.  4. The mitral valve is normal in structure. Mild to moderate mitral valve  regurgitation. No evidence of mitral stenosis.  5. Tricuspid valve regurgitation is moderate.  6. The aortic valve is normal in structure. Aortic valve regurgitation is  trivial. Mild to moderate aortic valve sclerosis/calcification is  present,  without any evidence of aortic stenosis.  7. The inferior vena cava is normal in size with greater than 50%  respiratory variability, suggesting right atrial pressure of 3 mmHg.   Patient Profile     86 y.o. female with PMH hypothyroidism, remote aneurysm with CVA, prior tobacco use who is seen in consultation for atrial flutter this admission at the request of Dr. Lorin Mercy.  Assessment & Plan    Atrial flutter -new diagnosis this admission -chadsvasc 5, changed to apixaban 5 mg BID (age only factor for reduced dose) -tolerating diltiazem. As she is now in sinus, with room in her BP, will consolidate to once daily diltiazem dose at 240 mg. -echo with EF 65-70% -elevated troponin likely demand ischemia in the setting of flutter -labs unremarkable  Shingles -per primary team  [facility] will sign off.   Medication Recommendations:  Discharge on diltiazem 240 mg daily, apixaban 5 mg BID. Would stop home aspirin given that she is now on anticoagulation. Would stop home HCTZ with start of diltiazem. Other recommendations (labs, testing, etc):  none Follow up as an outpatient:  We will arrange for outpatient follow up post discharge  For questions or updates, please contact [facility] Please consult [email or web address] for contact info under     Signed, Buford Dresser, MD  07/19/2020, 9:05 AM

## 2020-07-20 DIAGNOSIS — E7800 Pure hypercholesterolemia, unspecified: Secondary | ICD-10-CM | POA: Diagnosis not present

## 2020-07-20 DIAGNOSIS — Z823 Family history of stroke: Secondary | ICD-10-CM | POA: Diagnosis not present

## 2020-07-20 DIAGNOSIS — I10 Essential (primary) hypertension: Secondary | ICD-10-CM | POA: Diagnosis not present

## 2020-07-20 DIAGNOSIS — I499 Cardiac arrhythmia, unspecified: Secondary | ICD-10-CM | POA: Diagnosis not present

## 2020-07-21 DIAGNOSIS — I483 Typical atrial flutter: Secondary | ICD-10-CM | POA: Diagnosis not present

## 2020-07-21 DIAGNOSIS — B029 Zoster without complications: Secondary | ICD-10-CM | POA: Diagnosis not present

## 2020-07-21 DIAGNOSIS — I700 Atherosclerosis of aorta: Secondary | ICD-10-CM | POA: Diagnosis not present

## 2020-07-21 DIAGNOSIS — I10 Essential (primary) hypertension: Secondary | ICD-10-CM | POA: Diagnosis not present

## 2020-07-22 DIAGNOSIS — H353132 Nonexudative age-related macular degeneration, bilateral, intermediate dry stage: Secondary | ICD-10-CM | POA: Diagnosis not present

## 2020-07-22 DIAGNOSIS — H401231 Low-tension glaucoma, bilateral, mild stage: Secondary | ICD-10-CM | POA: Diagnosis not present

## 2020-07-22 DIAGNOSIS — H5201 Hypermetropia, right eye: Secondary | ICD-10-CM | POA: Diagnosis not present

## 2020-07-22 DIAGNOSIS — H5212 Myopia, left eye: Secondary | ICD-10-CM | POA: Diagnosis not present

## 2020-07-27 DIAGNOSIS — E7800 Pure hypercholesterolemia, unspecified: Secondary | ICD-10-CM | POA: Diagnosis not present

## 2020-07-27 DIAGNOSIS — I10 Essential (primary) hypertension: Secondary | ICD-10-CM | POA: Diagnosis not present

## 2020-07-27 DIAGNOSIS — I499 Cardiac arrhythmia, unspecified: Secondary | ICD-10-CM | POA: Diagnosis not present

## 2020-07-27 DIAGNOSIS — Z8249 Family history of ischemic heart disease and other diseases of the circulatory system: Secondary | ICD-10-CM | POA: Diagnosis not present

## 2020-07-27 DIAGNOSIS — Z823 Family history of stroke: Secondary | ICD-10-CM | POA: Diagnosis not present

## 2020-07-27 DIAGNOSIS — Z1379 Encounter for other screening for genetic and chromosomal anomalies: Secondary | ICD-10-CM | POA: Diagnosis not present

## 2020-08-04 DIAGNOSIS — B029 Zoster without complications: Secondary | ICD-10-CM | POA: Diagnosis not present

## 2020-08-04 DIAGNOSIS — H6123 Impacted cerumen, bilateral: Secondary | ICD-10-CM | POA: Diagnosis not present

## 2020-08-10 NOTE — Progress Notes (Deleted)
Cardiology Office Note    Date:  08/10/2020   ID:  Peggy Williams, DOB [birth year], MRN [id number]  PCP:  Lajean Manes, MD  Cardiologist: No primary care provider on file. EPS: None  No chief complaint on file.   History of Present Illness:  Peggy Williams is a 86 y.o. female with history of hypothyroidism, remote aneurysm with post procedure CVA, prior tobacco abuse who was discharged from the hospital 07/19/2020 with new onset atrial fibrillation.  CHA2DS2-VASc was 5 when she was placed on Eliquis and diltiazem and converted to normal sinus rhythm.  2D echo LVEF 65 to 70%.  Elevated troponins likely demand ischemia in the setting of flutter.    Past Medical History:  Diagnosis Date  . Arthritis   . Cataract   . Stroke [facility])    complication of aneurysm repair  . Thyroid disease     Past Surgical History:  Procedure Laterality Date  . ABDOMINAL HYSTERECTOMY    . BRAIN SURGERY    . EYE SURGERY    . JOINT REPLACEMENT      Current Medications: No outpatient medications have been marked as taking for the 08/11/20 encounter (Appointment) with Imogene Burn, PA-C.     Allergies:   Patient has no known allergies.   Social History   Socioeconomic History  . Marital status: Widowed    Spouse name: Not on file  . Number of children: Not on file  . Years of education: Not on file  . Highest education level: Not on file  Occupational History  . Occupation: retired  Tobacco Use  . Smoking status: Former Smoker    Packs/day: 1.00    Years: 45.00    Pack years: 45.00    Types: Cigarettes    Quit date: 2019    Years since quitting: 2.9  . Smokeless tobacco: Never Used  Substance and Sexual Activity  . Alcohol use: No  . Drug use: No  . Sexual activity: Never  Other Topics Concern  . Not on file  Social History Narrative  . Not on file   Social Determinants of Health   Financial Resource Strain:   . Difficulty of Paying Living Expenses: Not on file  Food  Insecurity:   . Worried About Charity fundraiser in the Last Year: Not on file  . Ran Out of Food in the Last Year: Not on file  Transportation Needs:   . Lack of Transportation (Medical): Not on file  . Lack of Transportation (Non-Medical): Not on file  Physical Activity:   . Days of Exercise per Week: Not on file  . Minutes of Exercise per Session: Not on file  Stress:   . Feeling of Stress : Not on file  Social Connections:   . Frequency of Communication with Friends and Family: Not on file  . Frequency of Social Gatherings with Friends and Family: Not on file  . Attends Religious Services: Not on file  . Active Member of Clubs or Organizations: Not on file  . Attends Archivist Meetings: Not on file  . Marital Status: Not on file     Family History:  The patient's ***family history is not on file.   ROS:   Please see the history of present illness.    ROS All other systems reviewed and are negative.   PHYSICAL EXAM:   VS:  There were no vitals taken for this visit.  Physical Exam  GEN: Well nourished, well developed, in  no acute distress  HEENT: normal  Neck: no JVD, carotid bruits, or masses Cardiac:RRR; no murmurs, rubs, or gallops  Respiratory:  clear to auscultation bilaterally, normal work of breathing GI: soft, nontender, nondistended, + BS Ext: without cyanosis, clubbing, or edema, Good distal pulses bilaterally MS: no deformity or atrophy  Skin: warm and dry, no rash Neuro:  Alert and Oriented x 3, Strength and sensation are intact Psych: euthymic mood, full affect  Wt Readings from Last 3 Encounters:  07/18/20 152 lb 6.4 oz (69.1 kg)  08/01/17 150 lb (68 kg)  09/01/13 145 lb (65.8 kg)      Studies/Labs Reviewed:   EKG:  EKG is*** ordered today.  The ekg ordered today demonstrates ***  Recent Labs: 07/17/2020: ALT 19; TSH 1.595 07/18/2020: B Natriuretic Peptide 516.9 07/19/2020: BUN 9; Creatinine, Ser 0.86; Hemoglobin 14.9; Platelets 241;  Potassium 4.2; Sodium 141   Lipid Panel No results found for: CHOL, TRIG, HDL, CHOLHDL, VLDL, LDLCALC, LDLDIRECT  Additional studies/ records that were reviewed today include:  2D echo 07/18/2020 IMPRESSIONS     1. Left ventricular ejection fraction, by estimation, is 65 to 70%. The  left ventricle has normal function. The left ventricle has no regional  wall motion abnormalities. Left ventricular diastolic function could not  be evaluated.   2. Right ventricular systolic function is normal. The right ventricular  size is normal. There is mildly elevated pulmonary artery systolic  pressure. The estimated right ventricular systolic pressure is 70.9 mmHg.   3. Left atrial size was mildly dilated.   4. The mitral valve is normal in structure. Mild to moderate mitral valve  regurgitation. No evidence of mitral stenosis.   5. Tricuspid valve regurgitation is moderate.   6. The aortic valve is normal in structure. Aortic valve regurgitation is  trivial. Mild to moderate aortic valve sclerosis/calcification is present,  without any evidence of aortic stenosis.   7. The inferior vena cava is normal in size with greater than 50%  respiratory variability, suggesting right atrial pressure of 3 mmHg.      ASSESSMENT:    1. Atrial flutter, unspecified type ([place])   2. Essential hypertension      PLAN:  In order of problems listed above:  Atrial flutter CHA2DS2-VASc equals 5 on Eliquis 5 mg twice daily and diltiazem 240 mg once daily.  Echo LVEF 65 to 70%  Hypertension HCTZ changed to diltiazem  Medication Adjustments/Labs and Tests Ordered: Current medicines are reviewed at length with the patient today.  Concerns regarding medicines are outlined above.  Medication changes, Labs and Tests ordered today are listed in the Patient Instructions below. There are no Patient Instructions on file for this visit.   Sumner Boast, PA-C  08/10/2020 12:45 PM    [facility] [place], [place], [place]  [address] Phone: [phone]; Fax: [phone]

## 2020-08-11 ENCOUNTER — Ambulatory Visit: Payer: Medicare Other | Admitting: Physician Assistant

## 2020-08-11 DIAGNOSIS — I10 Essential (primary) hypertension: Secondary | ICD-10-CM

## 2020-08-11 DIAGNOSIS — I4892 Unspecified atrial flutter: Secondary | ICD-10-CM

## 2020-08-31 DIAGNOSIS — L821 Other seborrheic keratosis: Secondary | ICD-10-CM | POA: Diagnosis not present

## 2020-08-31 DIAGNOSIS — L820 Inflamed seborrheic keratosis: Secondary | ICD-10-CM | POA: Diagnosis not present

## 2020-08-31 DIAGNOSIS — D229 Melanocytic nevi, unspecified: Secondary | ICD-10-CM | POA: Diagnosis not present

## 2020-08-31 DIAGNOSIS — L570 Actinic keratosis: Secondary | ICD-10-CM | POA: Diagnosis not present

## 2020-08-31 DIAGNOSIS — L814 Other melanin hyperpigmentation: Secondary | ICD-10-CM | POA: Diagnosis not present

## 2020-09-01 NOTE — Progress Notes (Signed)
Telehealth Visit     Virtual Visit via Video Note   This visit type was conducted due to national recommendations for restrictions regarding the COVID-19 Pandemic (e.g. social distancing) in an effort to limit this patient's exposure and mitigate transmission in our community.  Due to her co-morbid illnesses, this patient is at least at moderate risk for complications without adequate follow up.  This format is felt to be most appropriate for this patient at this time.  All issues noted in this document were discussed and addressed.  A limited physical exam was performed with this format.  Please refer to the patient's chart for her consent to telehealth for [facility].  Video Connection Lost Video connection was lost at > 50% of the duration of this visit, at which time the remainder of the visit was completed via audio only.     Evaluation Performed:  Follow-up visit   The patient was identified using 2 identifiers.   This visit type was conducted due to national recommendations for restrictions regarding the COVID-19 Pandemic (e.g. social distancing).  This format is felt to be most appropriate for this patient at this time.  All issues noted in this document were discussed and addressed.  No physical exam was performed (except for noted visual exam findings with Video Visits).  Please refer to the patient's chart (MyChart message for video visits and phone note for telephone visits) for the patient's consent to telehealth for [facility].  Date:  09/15/2020   ID:  Peggy Williams, DOB [birth year], MRN [id number]  Patient Location:  Home  Provider location:   [facility] Office  PCP:  Lajean Manes, MD  Cardiologist:  [place]  Electrophysiologist:  None   Chief Complaint:  Post hospital  History of Present Illness:    Peggy Williams is a 86 y.o. female who presents via audio/video conferencing for a telehealth visit today.  Seen for Dr. Harl Bowie but this was a one time visit - she  lives at [facility] in [place]. Prefers to transition to the [employer].   She has a history of hypothyroidism and very remote brain aneurysm with post-procedure CVA bleed (has chronic word-finding difficulties) treated with coiling - this was in her 7's. No known CAD.   Admitted back in November with back pain. She lives independently, exercises daily.  She was using Naproxen. The nurse came to see her and she was tachycardic with BP elevation and they sent her in.  Appeared to have aflutter, not very responsive to Dilt drip - troponins gradually rising, likely rate related.  Mild O2 need - CTA negative for PE, ?B LL infiltrates, COVID negative.  Started on antibiotics in case of PNA but no leukocytosis.  Also with ?shingles outbreak on L lower chest.  She was started on Eliquis. Valtrex for shingles.    The patient does not have symptoms concerning for COVID-19 infection (fever, chills, cough, or new shortness of breath).   Seen today by telephone visit. My video did not work. She has consented for this visit. This visit was facilitated by her son Vita Erm who is a PA. Ms. Piasecki has some aphasia and hearing issues (reason for prior missed appointment). She is doing well. No chest pain. No palpitations - but never had with her atrial flutter. No problems noted. Back to her baseline. Shingles resolved. Does need meds refilled.   Past Medical History:  Diagnosis Date  . Arthritis   . Cataract   . Stroke [facility])  complication of aneurysm repair  . Thyroid disease    Past Surgical History:  Procedure Laterality Date  . ABDOMINAL HYSTERECTOMY    . BRAIN SURGERY    . EYE SURGERY    . JOINT REPLACEMENT       Current Meds  Medication Sig  . apixaban (ELIQUIS) 5 MG TABS tablet Take 1 tablet (5 mg total) by mouth 2 (two) times daily.  Marland Kitchen diltiazem (CARDIZEM CD) 240 MG 24 hr capsule Take 1 capsule (240 mg total) by mouth daily.  Marland Kitchen levothyroxine (SYNTHROID, LEVOTHROID) 50 MCG  tablet Take 50 mcg by mouth daily before breakfast.     Allergies:   Patient has no known allergies.   Social History   Tobacco Use  . Smoking status: Former Smoker    Packs/day: 1.00    Years: 45.00    Pack years: 45.00    Types: Cigarettes    Quit date: 2019    Years since quitting: 3.0  . Smokeless tobacco: Never Used  Substance Use Topics  . Alcohol use: No  . Drug use: No     Family Hx: The patient's family history is not on file.  ROS:   Please see the history of present illness.   All other systems reviewed are negative.    Objective:    Vital Signs:  There were no vitals taken for this visit.   Wt Readings from Last 3 Encounters:  07/18/20 152 lb 6.4 oz (69.1 kg)  08/01/17 150 lb (68 kg)  09/01/13 145 lb (65.8 kg)    Alert female in no acute distress.   Labs/Other Tests and Data Reviewed:    Lab Results  Component Value Date   WBC 9.0 07/19/2020   HGB 14.9 07/19/2020   HCT 45.8 07/19/2020   PLT 241 07/19/2020   GLUCOSE 106 (H) 07/19/2020   ALT 19 07/17/2020   AST 22 07/17/2020   NA 141 07/19/2020   K 4.2 07/19/2020   CL 107 07/19/2020   CREATININE 0.86 07/19/2020   BUN 9 07/19/2020   CO2 26 07/19/2020   TSH 1.595 07/17/2020     BNP (last 3 results) Recent Labs    07/18/20 1451  BNP 516.9*    ProBNP (last 3 results) No results for input(s): PROBNP in the last 8760 hours.    Prior CV studies:    The following studies were reviewed today:  Echo 07/18/20 1. Left ventricular ejection fraction, by estimation, is 65 to 70%. The  left ventricle has normal function. The left ventricle has no regional  wall motion abnormalities. Left ventricular diastolic function could not  be evaluated.   2. Right ventricular systolic function is normal. The right ventricular  size is normal. There is mildly elevated pulmonary artery systolic  pressure. The estimated right ventricular systolic pressure is 38.1 mmHg.   3. Left atrial size was mildly  dilated.   4. The mitral valve is normal in structure. Mild to moderate mitral valve  regurgitation. No evidence of mitral stenosis.   5. Tricuspid valve regurgitation is moderate.   6. The aortic valve is normal in structure. Aortic valve regurgitation is  trivial. Mild to moderate aortic valve sclerosis/calcification is present,  without any evidence of aortic stenosis.   7. The inferior vena cava is normal in size with greater than 50%  respiratory variability, suggesting right atrial pressure of 3 mmHg.     Assessment & Plan    1. Lone episode of atrial flutter - CHADSVASC  of 5 - she is on Eliquis - full dose - will need in office visit in a few weeks for labs and EKG. Son prefers conservative approach. EF is normal.   2. Elevated troponin - likely demand ischemia. No further evaluation felt to be needed. She has no worrisome symptoms noted.   3. Shingles - this is resolved.   4. High risk medicine. Will need follow up labs. Would avoid Naproxen/NSAIDS.    Patient Risk:   After full review of this patient's clinical status, I feel that they are at least moderate risk at this time.  Time:   Today, I have spent 11 minutes with the patient's son with telehealth technology discussing the above issues.     Medication Adjustments/Labs and Tests Ordered: Current medicines are reviewed at length with the patient today.  Concerns regarding medicines are outlined above.   Tests Ordered: No orders of the defined types were placed in this encounter.   Medication Changes: No orders of the defined types were placed in this encounter.   Disposition:  FU with Dr. Johney Frame for a post hospital/new patient with labs and EKG in a few months. Medicines are refilled for them today.    Patient is agreeable to this plan and will call if any problems develop in the interim.   Amie Critchley, NP  09/15/2020 9:47 AM    [place]

## 2020-09-15 ENCOUNTER — Telehealth (INDEPENDENT_AMBULATORY_CARE_PROVIDER_SITE_OTHER): Payer: Medicare Other | Admitting: Nurse Practitioner

## 2020-09-15 ENCOUNTER — Encounter: Payer: Self-pay | Admitting: Nurse Practitioner

## 2020-09-15 ENCOUNTER — Other Ambulatory Visit: Payer: Self-pay

## 2020-09-15 DIAGNOSIS — I671 Cerebral aneurysm, nonruptured: Secondary | ICD-10-CM | POA: Diagnosis not present

## 2020-09-15 DIAGNOSIS — Z79899 Other long term (current) drug therapy: Secondary | ICD-10-CM | POA: Diagnosis not present

## 2020-09-15 DIAGNOSIS — Z7901 Long term (current) use of anticoagulants: Secondary | ICD-10-CM

## 2020-09-15 DIAGNOSIS — I4892 Unspecified atrial flutter: Secondary | ICD-10-CM

## 2020-09-15 MED ORDER — APIXABAN 5 MG PO TABS: 5.0000 mg | ORAL_TABLET | Freq: Two times a day (BID) | ORAL | 3 refills | Status: DC

## 2020-09-15 MED ORDER — DILTIAZEM HCL ER COATED BEADS 240 MG PO CP24: 240.0000 mg | ORAL_CAPSULE | Freq: Every day | ORAL | 3 refills | Status: DC

## 2020-09-15 NOTE — Patient Instructions (Addendum)
After Visit Summary:  We will be checking the following labs today - NONE   Medication Instructions:    Continue with your current medicines.   I have sent your refills in.   Would favor not taking Naproxen now that you are on Eliquis - this increases your risk of bleeding - taking Tylenol is preferred.    If you need a refill on your cardiac medications before your next appointment, please call your pharmacy.     Testing/Procedures To Be Arranged:  N/A  Follow-Up:   See Dr. Shari Prows here at the [facility] office in a few weeks with lab and EKG    At [facility], you and your health needs are our priority.  As part of our continuing mission to provide you with exceptional heart care, we have created designated Provider Care Teams.  These Care Teams include your primary Cardiologist (physician) and Advanced Practice Providers (APPs -  Physician Assistants and Nurse Practitioners) who all work together to provide you with the care you need, when you need it.  Special Instructions:  . Stay safe, wash your hands for at least 20 seconds and wear a mask when needed.  . It was good to talk with you today.    Call the [facility] office at [phone] if you have any questions, problems or concerns.

## 2020-09-25 ENCOUNTER — Emergency Department (HOSPITAL_COMMUNITY)
Admission: EM | Admit: 2020-09-25 | Discharge: 2020-09-25 | Disposition: A | Discharge status: Home / Self Care | Payer: Medicare Other | Attending: Emergency Medicine | Admitting: Emergency Medicine

## 2020-09-25 ENCOUNTER — Emergency Department (HOSPITAL_COMMUNITY): Payer: Medicare Other

## 2020-09-25 DIAGNOSIS — I480 Paroxysmal atrial fibrillation: Secondary | ICD-10-CM | POA: Insufficient documentation

## 2020-09-25 DIAGNOSIS — I499 Cardiac arrhythmia, unspecified: Secondary | ICD-10-CM | POA: Diagnosis not present

## 2020-09-25 DIAGNOSIS — J90 Pleural effusion, not elsewhere classified: Secondary | ICD-10-CM | POA: Diagnosis not present

## 2020-09-25 DIAGNOSIS — Z87891 Personal history of nicotine dependence: Secondary | ICD-10-CM | POA: Insufficient documentation

## 2020-09-25 DIAGNOSIS — Z743 Need for continuous supervision: Secondary | ICD-10-CM | POA: Diagnosis not present

## 2020-09-25 DIAGNOSIS — R0602 Shortness of breath: Secondary | ICD-10-CM | POA: Diagnosis not present

## 2020-09-25 DIAGNOSIS — Z7901 Long term (current) use of anticoagulants: Secondary | ICD-10-CM | POA: Diagnosis not present

## 2020-09-25 DIAGNOSIS — I4891 Unspecified atrial fibrillation: Secondary | ICD-10-CM | POA: Diagnosis not present

## 2020-09-25 DIAGNOSIS — R6889 Other general symptoms and signs: Secondary | ICD-10-CM | POA: Diagnosis not present

## 2020-09-25 DIAGNOSIS — R531 Weakness: Secondary | ICD-10-CM | POA: Diagnosis present

## 2020-09-25 LAB — CBC WITH DIFFERENTIAL/PLATELET
Abs Immature Granulocytes: 0.03 10*3/uL (ref 0.00–0.07)
Basophils Absolute: 0.1 10*3/uL (ref 0.0–0.1)
Basophils Relative: 1 %
Eosinophils Absolute: 0.1 10*3/uL (ref 0.0–0.5)
Eosinophils Relative: 1 %
HCT: 46.3 % — ABNORMAL HIGH (ref 36.0–46.0)
Hemoglobin: 14.9 g/dL (ref 12.0–15.0)
Immature Granulocytes: 0 %
Lymphocytes Relative: 11 %
Lymphs Abs: 0.9 10*3/uL (ref 0.7–4.0)
MCH: 31.3 pg (ref 26.0–34.0)
MCHC: 32.2 g/dL (ref 30.0–36.0)
MCV: 97.3 fL (ref 80.0–100.0)
Monocytes Absolute: 0.7 10*3/uL (ref 0.1–1.0)
Monocytes Relative: 8 %
Neutro Abs: 6.5 10*3/uL (ref 1.7–7.7)
Neutrophils Relative %: 79 %
Platelets: 257 10*3/uL (ref 150–400)
RBC: 4.76 MIL/uL (ref 3.87–5.11)
RDW: 15.1 % (ref 11.5–15.5)
WBC: 8.3 10*3/uL (ref 4.0–10.5)
nRBC: 0 % (ref 0.0–0.2)

## 2020-09-25 LAB — COMPREHENSIVE METABOLIC PANEL
ALT: 68 U/L — ABNORMAL HIGH (ref 0–44)
AST: 67 U/L — ABNORMAL HIGH (ref 15–41)
Albumin: 3.4 g/dL — ABNORMAL LOW (ref 3.5–5.0)
Alkaline Phosphatase: 79 U/L (ref 38–126)
Anion gap: 9 (ref 5–15)
BUN: 13 mg/dL (ref 8–23)
CO2: 26 mmol/L (ref 22–32)
Calcium: 9 mg/dL (ref 8.9–10.3)
Chloride: 107 mmol/L (ref 98–111)
Creatinine, Ser: 1.05 mg/dL — ABNORMAL HIGH (ref 0.44–1.00)
GFR, Estimated: 51 mL/min — ABNORMAL LOW (ref >60)
Glucose, Bld: 125 mg/dL — ABNORMAL HIGH (ref 70–99)
Potassium: 4.2 mmol/L (ref 3.5–5.1)
Sodium: 142 mmol/L (ref 135–145)
Total Bilirubin: 0.8 mg/dL (ref 0.3–1.2)
Total Protein: 6 g/dL — ABNORMAL LOW (ref 6.5–8.1)

## 2020-09-25 LAB — TROPONIN I (HIGH SENSITIVITY)
Troponin I (High Sensitivity): 15 ng/L (ref <18)
Troponin I (High Sensitivity): 14 ng/L (ref <18)

## 2020-09-25 MED ORDER — DILTIAZEM HCL-DEXTROSE 125-5 MG/125ML-% IV SOLN (PREMIX)
5.0000 mg/h | INTRAVENOUS | Status: DC
  Administered 2020-09-25: 5 mg/h via INTRAVENOUS
  Filled 2020-09-25: qty 125

## 2020-09-25 MED ORDER — DILTIAZEM LOAD VIA INFUSION
10.0000 mg | Freq: Once | INTRAVENOUS | Status: AC
  Administered 2020-09-25: 10 mg via INTRAVENOUS
  Filled 2020-09-25: qty 10

## 2020-09-25 MED ORDER — PROPOFOL 10 MG/ML IV BOLUS
INTRAVENOUS | Status: AC
  Administered 2020-09-25: 35 mg via INTRAVENOUS
  Filled 2020-09-25: qty 20

## 2020-09-25 MED ORDER — PROPOFOL 10 MG/ML IV BOLUS: 35.0000 mg | Freq: Once | INTRAVENOUS | Status: AC

## 2020-09-25 NOTE — ED Provider Notes (Signed)
[place] EMERGENCY DEPARTMENT Provider Note  CSN: [id number] Arrival date & time: 09/25/20 9179    History Chief Complaint  Patient presents with  . Weakness    HPI  Peggy Williams is a 87 y.o. female with history of aflutter during and admission last November was seen via video visit with Cardiology on 1/5. Note from that visit indicates she was taking Eliquis, but she states she thought this was stopped about a month ago. Per EMS she was started on diltiazem last week but she did not bring her medications with her to confirm. This morning she reports she woke up to use the restroom and felt weak and dizzy. Denies any chest pain. Noted to be in rapid afib with EMS.    Past Medical History:  Diagnosis Date  . Arthritis   . Cataract   . Stroke [facility])    complication of aneurysm repair  . Thyroid disease     Past Surgical History:  Procedure Laterality Date  . ABDOMINAL HYSTERECTOMY    . BRAIN SURGERY    . EYE SURGERY    . JOINT REPLACEMENT      No family history on file.  Social History   Tobacco Use  . Smoking status: Former Smoker    Packs/day: 1.00    Years: 45.00    Pack years: 45.00    Types: Cigarettes    Quit date: 2019    Years since quitting: 3.0  . Smokeless tobacco: Never Used  Substance Use Topics  . Alcohol use: No  . Drug use: No     Home Medications Prior to Admission medications   Medication Sig Start Date End Date Taking? Authorizing Provider  apixaban (ELIQUIS) 5 MG TABS tablet Take 1 tablet (5 mg total) by mouth 2 (two) times daily. 09/15/20   Rosalio Macadamia, NP  diltiazem (CARDIZEM CD) 240 MG 24 hr capsule Take 1 capsule (240 mg total) by mouth daily. 09/15/20   Rosalio Macadamia, NP  levothyroxine (SYNTHROID, LEVOTHROID) 50 MCG tablet Take 50 mcg by mouth daily before breakfast.    [provider]  Multiple Vitamins-Minerals (OCUVITE ADULT 50+) CAPS     [provider]  Naproxen Sodium (ALEVE) 220 MG CAPS 1 capsule with  food or milk as needed    [provider]  Vitamin D-Vitamin K (VITAMIN K2-VITAMIN D3) 45-2000 MCG-UNIT CAPS 1 capsule    [provider]     Allergies    Patient has no known allergies.   Review of Systems   Review of Systems A comprehensive review of systems was completed and negative except as noted in HPI.    Physical Exam BP 132/83   Pulse (!) 28   Temp (!) 97.5 F (36.4 C)   Resp (!) 28   SpO2 97%   Physical Exam Vitals and nursing note reviewed.  Constitutional:      Appearance: Normal appearance.  HENT:     Head: Normocephalic and atraumatic.     Nose: Nose normal.     Mouth/Throat:     Mouth: Mucous membranes are moist.  Eyes:     Extraocular Movements: Extraocular movements intact.     Conjunctiva/sclera: Conjunctivae normal.  Cardiovascular:     Rate and Rhythm: Normal rate.  Pulmonary:     Effort: Pulmonary effort is normal.     Breath sounds: Normal breath sounds.  Abdominal:     General: Abdomen is flat.     Palpations: Abdomen is soft.  Tenderness: There is no abdominal tenderness.  Musculoskeletal:        General: No swelling. Normal range of motion.     Cervical back: Neck supple.  Skin:    General: Skin is warm and dry.  Neurological:     General: No focal deficit present.     Mental Status: She is alert.  Psychiatric:        Mood and Affect: Mood normal.      ED Results / Procedures / Treatments   Labs (all labs ordered are listed, but only abnormal results are displayed) Labs Reviewed  CBC WITH DIFFERENTIAL/PLATELET - Abnormal; Notable for the following components:      Result Value   HCT 46.3 (*)    All other components within normal limits  COMPREHENSIVE METABOLIC PANEL - Abnormal; Notable for the following components:   Glucose, Bld 125 (*)    Creatinine, Ser 1.05 (*)    Total Protein 6.0 (*)    Albumin 3.4 (*)    AST 67 (*)    ALT 68 (*)    GFR, Estimated 51 (*)    All other components within normal  limits  TROPONIN I (HIGH SENSITIVITY)  TROPONIN I (HIGH SENSITIVITY)    EKG EKG Interpretation  Date/Time:  Saturday September 25 2020 09:32:28 EST Ventricular Rate:  82 PR Interval:    QRS Duration: 107 QT Interval:  405 QTC Calculation: 473 R Axis:   127 Text Interpretation: Sinus rhythm Atrial premature complex Low voltage with right axis deviation Nonspecific T abnormalities, lateral leads Since last tracing Sinus rhythm has replaced Atrial fibrillation with rapid ventricular response Confirmed by Calvert Cantor [id number]) on 09/25/2020 9:39:17 AM   Radiology DG Chest Port 1 View  Result Date: 09/25/2020 CLINICAL DATA:  Shortness of breath and AFib. EXAM: PORTABLE CHEST 1 VIEW COMPARISON:  07/17/2020 FINDINGS: Patient is slightly rotated to the right. Lungs are slightly hypoinflated with hazy patchy airspace density in the mid to lower lungs as well as prominence of the central pulmonary vasculature as findings may be due to edema versus infection. Minimal blunting left costophrenic angle likely small effusion. Remainder of the exam is unchanged. IMPRESSION: Suggestion of mild vascular congestion with hazy patchy bibasilar opacification which may be due to edema versus infection. Suggestion of small left effusion. Electronically Signed   By: Marin Olp M.D.   On: 09/25/2020 08:19    Procedures .Critical Care Performed by: Truddie Hidden, MD Authorized by: Truddie Hidden, MD   Critical care provider statement:    Critical care time (minutes):  45   Critical care time was exclusive of:  Separately billable procedures and treating other patients   Critical care was necessary to treat or prevent imminent or life-threatening deterioration of the following conditions:  Cardiac failure   Critical care was time spent personally by me on the following activities:  Discussions with consultants, evaluation of patient's response to treatment, examination of patient, ordering and  performing treatments and interventions, ordering and review of laboratory studies, ordering and review of radiographic studies, pulse oximetry, re-evaluation of patient's condition, obtaining history from patient or surrogate and review of old charts .Sedation  Date/Time: 09/25/2020 9:41 AM Performed by: Truddie Hidden, MD Authorized by: Truddie Hidden, MD   Consent:    Consent obtained:  Verbal and written   Consent given by:  Patient   Risks discussed:  Allergic reaction, dysrhythmia, inadequate sedation, nausea, prolonged hypoxia resulting in organ damage, prolonged sedation necessitating  reversal, respiratory compromise necessitating ventilatory assistance and intubation and vomiting   Alternatives discussed:  Analgesia without sedation Universal protocol:    Procedure explained and questions answered to patient or proxy's satisfaction: yes     Relevant documents present and verified: yes     Test results available: yes     Imaging studies available: yes     Required blood products, implants, devices, and special equipment available: yes     Site/side marked: yes     Immediately prior to procedure, a time out was called: yes     Patient identity confirmed:  Verbally with patient Indications:    Procedure performed:  Cardioversion   Procedure necessitating sedation performed by:  Physician performing sedation Pre-sedation assessment:    Time since last food or drink:  12 hours   ASA classification: class 2 - patient with mild systemic disease     Mouth opening:  3 or more finger widths   Thyromental distance:  4 finger widths   Mallampati score:  I - soft palate, uvula, fauces, pillars visible   Neck mobility: normal     Pre-sedation assessments completed and reviewed: airway patency, cardiovascular function, hydration status, mental status, nausea/vomiting, pain level, respiratory function and temperature     Pre-sedation assessment completed:  09/25/2020 8:30 AM Immediate  pre-procedure details:    Reassessment: Patient reassessed immediately prior to procedure     Reviewed: vital signs, relevant labs/tests and NPO status     Verified: bag valve mask available, emergency equipment available, intubation equipment available, IV patency confirmed, oxygen available and suction available   Procedure details (see MAR for exact dosages):    Preoxygenation:  Nasal cannula   Sedation:  Propofol   Intended level of sedation: deep   Intra-procedure monitoring:  Blood pressure monitoring, cardiac monitor, continuous pulse oximetry, frequent LOC assessments, frequent vital sign checks and continuous capnometry   Intra-procedure events: none     Total Provider sedation time (minutes):  10 Post-procedure details:    Post-sedation assessment completed:  09/25/2020 9:42 AM   Attendance: Constant attendance by certified staff until patient recovered     Recovery: Patient returned to pre-procedure baseline     Post-sedation assessments completed and reviewed: airway patency, cardiovascular function, hydration status, mental status, nausea/vomiting, pain level, respiratory function and temperature     Patient is stable for discharge or admission: yes     Procedure completion:  Tolerated well, no immediate complications .Cardioversion  Date/Time: 09/25/2020 9:42 AM Performed by: Truddie Hidden, MD Authorized by: Truddie Hidden, MD   Consent:    Consent obtained:  Written and verbal   Consent given by:  Patient Pre-procedure details:    Cardioversion basis:  Elective   Rhythm:  Atrial fibrillation   Electrode placement:  Anterior-posterior Patient sedated: Yes. Refer to sedation procedure documentation for details of sedation.  Attempt one:    Cardioversion mode:  Synchronous   Waveform:  Biphasic   Shock (Joules):  150   Shock outcome:  Conversion to normal sinus rhythm Post-procedure details:    Patient status:  Awake   Patient tolerance of procedure:   Tolerated well, no immediate complications    Medications Ordered in the ED Medications  diltiazem (CARDIZEM) 1 mg/mL load via infusion 10 mg (10 mg Intravenous Bolus from Bag 09/25/20 0812)    And  diltiazem (CARDIZEM) 125 mg in dextrose 5% 125 mL (1 mg/mL) infusion (0 mg/hr Intravenous Stopped 09/25/20 0911)  propofol (DIPRIVAN) 10 mg/mL bolus/IV  push 35 mg (35 mg Intravenous Given 09/25/20 0940)     MDM Rules/Calculators/A&P MDM Patient in afib with RVR here. Given inconsistent history of Eliquis use, will avoid cardioversion until this can be confirmed one way or the other. Diltiazem bolus/drip in the meantime for rate control. Will check labs and CXR.   ED Course  I have reviewed the triage vital signs and the nursing notes.  Pertinent labs & imaging results that were available during my care of the patient were reviewed by me and considered in my medical decision making (see chart for details).  Clinical Course as of 09/25/20 1220  Sat Sep 25, 2020  0847 CBC is normal.  [CS]  [phone] Pharmacy tech has confirmed patient is taking her Eliquis. She was confused between blood pressure meds and blood thinner. She has given consent for cardioversion. NPO since last night. Diltiazem turned off.  [CS]  [phone] Patient tolerated cardioversion well with conversion to NSR. Will continue to monitor. CMP and Trop are neg.  [CS]  1217 Second Trop is neg. Patient remains in NSR and asymptomatic. She would like to go home. Recommend she continue her home meds and follow up with Cardiology for recheck. RTED for any other worsening.  [CS]    Clinical Course User Index [CS] Truddie Hidden, MD    Final Clinical Impression(s) / ED Diagnoses Final diagnoses:  Paroxysmal atrial fibrillation [facility])  Atrial fibrillation status post cardioversion [facility])    Rx / DC Orders ED Discharge Orders    None       Truddie Hidden, MD 09/25/20 1220

## 2020-09-25 NOTE — ED Triage Notes (Addendum)
Pt BIB EMS from assisted living facility for c/o generalized weakness and "feeling off." Denies pain with EMS; Per EMS, SOB noted on arrival that has resolved upon arrival to [facility] ED. A&O x4  Hx a fib. Began diltiazem on Monday Hx stroke, R sided deficits  Takes eliquis  EMS: A fib RVR 150-160 BP 146/100 91-92% RA, 96% 3L [place]

## 2020-09-25 NOTE — ED Notes (Signed)
Pts son is on the way to get her. Pt ambulated to wheel chair and to the lobby to wait on son.

## 2020-09-29 ENCOUNTER — Ambulatory Visit (HOSPITAL_COMMUNITY)
Admission: RE | Admit: 2020-09-29 | Discharge: 2020-09-29 | Disposition: A | Discharge status: Home / Self Care | Payer: Medicare Other | Source: Ambulatory Visit | Attending: Nurse Practitioner | Admitting: Nurse Practitioner

## 2020-09-29 ENCOUNTER — Other Ambulatory Visit: Payer: Self-pay

## 2020-09-29 ENCOUNTER — Encounter (HOSPITAL_COMMUNITY): Payer: Self-pay | Admitting: Nurse Practitioner

## 2020-09-29 VITALS — BP 118/82 | HR 129 | Wt 158.4 lb

## 2020-09-29 DIAGNOSIS — D6869 Other thrombophilia: Secondary | ICD-10-CM | POA: Diagnosis not present

## 2020-09-29 DIAGNOSIS — Z87891 Personal history of nicotine dependence: Secondary | ICD-10-CM | POA: Insufficient documentation

## 2020-09-29 DIAGNOSIS — Z7901 Long term (current) use of anticoagulants: Secondary | ICD-10-CM | POA: Insufficient documentation

## 2020-09-29 DIAGNOSIS — I4891 Unspecified atrial fibrillation: Secondary | ICD-10-CM | POA: Insufficient documentation

## 2020-09-29 DIAGNOSIS — I700 Atherosclerosis of aorta: Secondary | ICD-10-CM | POA: Diagnosis not present

## 2020-09-29 DIAGNOSIS — I480 Paroxysmal atrial fibrillation: Secondary | ICD-10-CM | POA: Diagnosis not present

## 2020-09-29 MED ORDER — METOPROLOL SUCCINATE ER 25 MG PO TB24: 12.5000 mg | ORAL_TABLET | Freq: Every day | ORAL | 1 refills | Status: DC

## 2020-09-29 NOTE — Progress Notes (Signed)
Primary Care Physician: Lajean Manes, MD Referring Physician: [facility] f/u    Peggy Williams is a 87 y.o. female with a h/o afib that was first dx in November in the setting of shingles. She was hospitalized 11/6 thur 07/19/20 and was seen in f/u with Truitt Merle, NP, 09/16/19. She was doing well being in SR. She was then seen in the ER 1/15 for afib with RVR, at 137 bpm,  and was cardioverted. She  is being seen in f/u here and is back  in afib at 127 bpm.  She is fatigued in afib. She  lives independently and has enjoyed good health. She  is quite HOH. She is here with her son who is a PA, and he desires conservative therapy. She drinks minimal to  moderate caffeine, 2 alcoholic drinks in the afternoons. No known issues with snoring/apnea, no tobacco use. She is being compliant with eliquis 5 mg daily. CHA2DS2VASc score is 5.  She is pending an appointment with Dr. Johney Frame 1/28.  Today, she denies symptoms of palpitations, chest pain, shortness of breath, orthopnea, PND, lower extremity edema, dizziness, presyncope, syncope, or neurologic sequela. The patient is tolerating medications without difficulties and is otherwise without complaint today.   Past Medical History:  Diagnosis Date  . Arthritis   . Cataract   . Stroke [facility])    complication of aneurysm repair  . Thyroid disease    Past Surgical History:  Procedure Laterality Date  . ABDOMINAL HYSTERECTOMY    . BRAIN SURGERY    . EYE SURGERY    . JOINT REPLACEMENT      Current Outpatient Medications  Medication Sig Dispense Refill  . apixaban (ELIQUIS) 5 MG TABS tablet Take 1 tablet (5 mg total) by mouth 2 (two) times daily. 180 tablet 3  . diltiazem (CARDIZEM CD) 240 MG 24 hr capsule Take 1 capsule (240 mg total) by mouth daily. 90 capsule 3  . levothyroxine (SYNTHROID, LEVOTHROID) 50 MCG tablet Take 50 mcg by mouth daily before breakfast.    . Multiple Vitamins-Minerals (Stover 50+) CAPS     . Naproxen Sodium (ALEVE) 220 MG  CAPS 1 capsule with food or milk as needed    . Vitamin D-Vitamin K (VITAMIN K2-VITAMIN D3) 45-2000 MCG-UNIT CAPS 1 capsule     No current facility-administered medications for this encounter.    No Known Allergies  Social History   Socioeconomic History  . Marital status: Widowed    Spouse name: Not on file  . Number of children: Not on file  . Years of education: Not on file  . Highest education level: Not on file  Occupational History  . Occupation: retired  Tobacco Use  . Smoking status: Former Smoker    Packs/day: 1.00    Years: 45.00    Pack years: 45.00    Types: Cigarettes    Quit date: 2019    Years since quitting: 3.0  . Smokeless tobacco: Never Used  Substance and Sexual Activity  . Alcohol use: No  . Drug use: No  . Sexual activity: Never  Other Topics Concern  . Not on file  Social History Narrative  . Not on file   Social Determinants of Health   Financial Resource Strain: Not on file  Food Insecurity: Not on file  Transportation Needs: Not on file  Physical Activity: Not on file  Stress: Not on file  Social Connections: Not on file  Intimate Partner Violence: Not on file  No family history on file.  ROS- All systems are reviewed and negative except as per the HPI above  Physical Exam: There were no vitals filed for this visit. Wt Readings from Last 3 Encounters:  07/18/20 69.1 kg  08/01/17 68 kg  09/01/13 65.8 kg    Labs: Lab Results  Component Value Date   NA 142 09/25/2020   K 4.2 09/25/2020   CL 107 09/25/2020   CO2 26 09/25/2020   GLUCOSE 125 (H) 09/25/2020   BUN 13 09/25/2020   CREATININE 1.05 (H) 09/25/2020   CALCIUM 9.0 09/25/2020   No results found for: INR No results found for: CHOL, HDL, LDLCALC, TRIG   GEN- The patient is well appearing, alert and oriented x 3 today.   Head- normocephalic, atraumatic Eyes-  Sclera clear, conjunctiva pink Ears- hearing intact Oropharynx- clear Neck- supple, no JVP Lymph- no  cervical lymphadenopathy Lungs- few crackles in the bases, normal work of breathing Heart- irregular rate and rhythm, no murmurs, rubs or gallops, PMI not laterally displaced GI- soft, NT, ND, + BS Extremities- no clubbing, cyanosis, or edema MS- no significant deformity or atrophy Skin- no rash or lesion Psych- euthymic mood, full affect Neuro- strength and sensation are intact  EKG-afib at 129 bpm, qrs int 80 ms, qtc 392 ms  Echo-1. Left ventricular ejection fraction, by estimation, is 65 to 70%. The  left ventricle has normal function. The left ventricle has no regional  wall motion abnormalities. Left ventricular diastolic function could not  be evaluated.  2. Right ventricular systolic function is normal. The right ventricular  size is normal. There is mildly elevated pulmonary artery systolic  pressure. The estimated right ventricular systolic pressure is 78.2 mmHg.  3. Left atrial size was mildly dilated.  4. The mitral valve is normal in structure. Mild to moderate mitral valve  regurgitation. No evidence of mitral stenosis.  5. Tricuspid valve regurgitation is moderate.  6. The aortic valve is normal in structure. Aortic valve regurgitation is  trivial. Mild to moderate aortic valve sclerosis/calcification is present,  without any evidence of aortic stenosis.  7. The inferior vena cava is normal in size with greater than 50%  respiratory variability, suggesting right atrial pressure of 3 mmHg.   Chest CT-07/17/20-IMPRESSION: 1. No evidence for acute pulmonary embolus. 2. Multifocal bilateral lower lung zone predominant areas of consolidation and ground-glass attenuation compatible with inflammation/infection. Short-term follow-up imaging in 3 months is recommended following appropriate antibiotic therapy and resolution of any symptoms. 3. Coronary artery calcifications noted. 4. Bilateral hilar and mediastinal adenopathy. This is a nonspecific finding in the  setting of pneumonia and may be reactive in etiology. Attention on 3 month follow-up chest CT is recommended to ensure resolution. 5. Scattered nonspecific milli metric lung nodules are noted bilaterally which are likely postinflammatory/infectious in etiology. Attention on follow-up imaging is advised. 6. Nonobstructing left renal calculi. 7. Aortic atherosclerosis.  Assessment and Plan: 1. New onset  afib 11/21 Initial return to SR with [facility]  Return  of afib 1/15 Successful  cardioversion 1/15 with ERAF Son feels that it may have not lasted but one day  Discussed  options Son would like a conservative approach For now will continue Cardizem 240 mg daily Rate control approach was discussed and the son agreed to try this  Will add 12.5 mg toprol xl at hs, may can be increased on f/u appointment   Son will watch BP with  additional  drug added  I hesitate to  use amiodarone as ground glass attenuation was noted on chest CT and I hear some crackles in base of lungs that sounds compatible for possible interstitial  lung disease, f/u ct of chest was recommended in 3 months  She also has thyroid issues at baseline which amiodarone may exacerbate  Digoxin may be another option if BB is not tolerated  If continues to  have RVR with meds being ineffective to slow rate, possibly  could consider PPM with AV nodal ablation  She will decrease alcohol use  to no more than 2 drinks a week   2. CHA2DS2VASc score of 5 Continue eliuis 5 mg bid   Bleeding precautions reviewed   F/u with Dr. Johney Frame 1/28  Geroge Baseman. Shogo Larkey, [facility] [address] [place], [place] [address] [phone]

## 2020-09-30 NOTE — Progress Notes (Signed)
Cardiology Office Note:    Date:  10/08/2020   ID:  Peggy Williams, DOB March 08, 1929, MRN [id number]  PCP:  Peggy Manes, MD  [facility] Cardiologist:  No primary care provider on file.  [facility] Electrophysiologist:  None   Referring MD: Peggy Manes, MD    History of Present Illness:    Peggy Williams is a 87 y.o. female with a hx of Afib (dx 07/2020 in the setting of shingles), remote history of brain aneurysm with post-procedure CVA bleed s/p coiling and hypothyroidism who presents to clinic for follow-up.  Has been followed by Peggy Merle, NP and Peggy Palau, NP. Last saw Peggy Williams on 09/29/20 after recent ER visit for Afib with RVR. She was DCCV at that time but was noted to be back in Afib with RVR with HR 127bpm in clinic. She was symptomatic with fatigue. She was accompanied by her son who preferred conservative management. She has been compliant with her eliquis.During that visit, she was continued on cardizem 240mg  and metop 12.5mg  XL added. Did not want to pursue amiodarone due to ground glass opacities on CT chest and hypothyroidism.  Received a message from the patient's son as her heart rates were running high at home despite the above regimen. We increased her metoprolol succinate to 25mg  BID at that time. Since increasing the metop, the heart rate still has been bouncing all over the place running anywhere from 60-130s. Blood pressure has been on the lower side with systolics 0000000. Feels very tired with fast heart rates, but no palpitations, chest pain, shortness of breath. She has also noticed LE swelling over the past couple days and is notably wheezy when walking into the office today. Was prior heavy smoker. Quit 5 years ago. Has not seen pulmonary.   Past Medical History:  Diagnosis Date  . Arthritis   . Cataract   . Stroke [facility])    complication of aneurysm repair  . Thyroid disease     Past Surgical History:  Procedure Laterality Date  .  ABDOMINAL HYSTERECTOMY    . BRAIN SURGERY    . EYE SURGERY    . JOINT REPLACEMENT      Current Medications: Current Meds  Medication Sig  . apixaban (ELIQUIS) 5 MG TABS tablet Take 1 tablet (5 mg total) by mouth 2 (two) times daily.  Marland Kitchen diltiazem (CARDIZEM CD) 240 MG 24 hr capsule Take 1 capsule (240 mg total) by mouth daily.  . furosemide (LASIX) 20 MG tablet Take 1 tablet (20 mg total) by mouth daily. Take 1 tablet by mouth daily for 3 days  . levothyroxine (SYNTHROID, LEVOTHROID) 50 MCG tablet Take 50 mcg by mouth daily before breakfast.  . metoprolol succinate (TOPROL-XL) 25 MG 24 hr tablet Take 12.5 mg by mouth in the morning and at bedtime.  . Multiple Vitamins-Minerals ([place] 50+) CAPS   . Vitamin D-Vitamin K (VITAMIN K2-VITAMIN D3) 45-2000 MCG-UNIT CAPS 1 capsule  . [DISCONTINUED] metoprolol succinate (TOPROL XL) 25 MG 24 hr tablet Take 0.5 tablets (12.5 mg total) by mouth daily.  . [DISCONTINUED] metoprolol succinate (TOPROL-XL) 25 MG 24 hr tablet Take 12.5 mg by mouth in the morning and at bedtime.     Allergies:   Patient has no known allergies.   Social History   Socioeconomic History  . Marital status: Widowed    Spouse name: Not on file  . Number of children: Not on file  . Years of education: Not on file  . Highest  education level: Not on file  Occupational History  . Occupation: retired  Tobacco Use  . Smoking status: Former Smoker    Packs/day: 1.00    Years: 45.00    Pack years: 45.00    Types: Cigarettes    Quit date: 2019    Years since quitting: 3.0  . Smokeless tobacco: Never Used  Substance and Sexual Activity  . Alcohol use: No  . Drug use: No  . Sexual activity: Never  Other Topics Concern  . Not on file  Social History Narrative  . Not on file   Social Determinants of Health   Financial Resource Strain: Not on file  Food Insecurity: Not on file  Transportation Needs: Not on file  Physical Activity: Not on file  Stress: Not on  file  Social Connections: Not on file     Family History: The patient's family history is not on file.  ROS:   Please see the history of present illness.    Review of Systems  Constitutional: Positive for malaise/fatigue. Negative for chills and fever.  HENT: Positive for hearing loss.   Eyes: Negative for blurred vision.  Respiratory: Positive for shortness of breath and wheezing.   Cardiovascular: Positive for leg swelling. Negative for chest pain, palpitations, orthopnea, claudication and PND.  Gastrointestinal: Negative for nausea and vomiting.  Genitourinary: Negative for frequency.  Musculoskeletal: Negative for falls.  Neurological: Negative for dizziness and loss of consciousness.  Psychiatric/Behavioral: Negative for substance abuse.    EKGs/Labs/Other Studies Reviewed:    The following studies were reviewed today: Echo 11.07/21: 1. Left ventricular ejection fraction, by estimation, is 65 to 70%. The  left ventricle has normal function. The left ventricle has no regional  wall motion abnormalities. Left ventricular diastolic function could not  be evaluated.  2. Right ventricular systolic function is normal. The right ventricular  size is normal. There is mildly elevated pulmonary artery systolic  pressure. The estimated right ventricular systolic pressure is 60.6 mmHg.  3. Left atrial size was mildly dilated.  4. The mitral valve is normal in structure. Mild to moderate mitral valve  regurgitation. No evidence of mitral stenosis.  5. Tricuspid valve regurgitation is moderate.  6. The aortic valve is normal in structure. Aortic valve regurgitation is  trivial. Mild to moderate aortic valve sclerosis/calcification is present,  without any evidence of aortic stenosis.  7. The inferior vena cava is normal in size with greater than 50%  respiratory variability, suggesting right atrial pressure of 3 mmHg.   Chest CT-07/17/20-IMPRESSION: 1. No evidence for acute  pulmonary embolus. 2. Multifocal bilateral lower lung zone predominant areas of consolidation and ground-glass attenuation compatible with inflammation/infection. Short-term follow-up imaging in 3 months is recommended following appropriate antibiotic therapy and resolution of any symptoms. 3. Coronary artery calcifications noted. 4. Bilateral hilar and mediastinal adenopathy. This is a nonspecific finding in the setting of pneumonia and may be reactive in etiology. Attention on 3 month follow-up chest CT is recommended to ensure resolution. 5. Scattered nonspecific milli metric lung nodules are noted bilaterally which are likely postinflammatory/infectious in etiology. Attention on follow-up imaging is advised. 6. Nonobstructing left renal calculi. 7. Aortic atherosclerosis.  EKG:  EKG is  ordered today.  The ekg ordered today demonstrates Afib with low-voltage throughout. HR 108.  Recent Labs: 07/17/2020: TSH 1.595 07/18/2020: B Natriuretic Peptide 516.9 09/25/2020: ALT 68; BUN 13; Creatinine, Ser 1.05; Hemoglobin 14.9; Platelets 257; Potassium 4.2; Sodium 142  Recent Lipid Panel No results found for:  CHOL, TRIG, HDL, CHOLHDL, VLDL, LDLCALC, LDLDIRECT   Risk Assessment/Calculations:    CHA2DS2-VASc Score = 5  This indicates a 7.2% annual risk of stroke. The patient's score is based upon: CHF History: No HTN History: Yes Diabetes History: No Stroke History: No Vascular Disease History: Yes Age Score: 2 Gender Score: 1    Physical Exam:    VS:  BP 100/72   Pulse 100   Ht 5\' 4"  (1.626 m)   Wt 164 lb 12.8 oz (74.8 kg)   SpO2 (!) 89%   BMI 28.29 kg/m     Wt Readings from Last 3 Encounters:  10/08/20 164 lb 12.8 oz (74.8 kg)  09/29/20 158 lb 6.4 oz (71.8 kg)  07/18/20 152 lb 6.4 oz (69.1 kg)     GEN: Elderly female, comfortable, hard of hearing HEENT: Normal NECK: Mildly elevated JVD CARDIAC:  Irregularly irregular, 1/6 systolic murmur RESPIRATORY:  Fine crackles  at the bases. Scattered wheezing ABDOMEN: Soft, non-tender, non-distended MUSCULOSKELETAL: Warm, 1+ pedal edema to the knees SKIN: Warm and dry NEUROLOGIC:  Alert and oriented x 3 PSYCHIATRIC:  Normal affect   ASSESSMENT:    1. Paroxysmal atrial fibrillation (HCC)   2. Medication management   3. Chronic obstructive pulmonary disease, unspecified COPD type ([place])   4. Chronic anticoagulation   5. High risk medication use   6. Acute on chronic diastolic heart failure (HCC)    PLAN:    In order of problems listed above:  #Persistent Afib: CHADs-vasc 5. Followed by Afib clinic. Currently on dilt 240mg  daily and metop 25mg  XL BID. Was successfully DCCV on 09/25/20 but back out of rhythm. Want to pursue conservation approach. Not ideal candidate for amiodarone due to concern for ILD on CT chest and underlying hypothyroidism. Blood pressure soft and actively wheezing on exam and therefore do not want to uptitrate BB or dilt at this time. Will do trial of digoxin and have her follow-up in Afib clinic for further monitoring -Start digoxin 0.125mg  today -Check level at visit with Afib clinic next week -Continue dilt 240mg  daily -Continue metop 25mg  XL BID -Continue apixaban 5mg  BID -Follow-up with Afib clinic as scheduled  #Acute on chronic diastolic heart failure: Mildly overloaded on exam today with worsening LE edema. Likely triggered by Afib with RVR and suspected diastolic dysfunction. -Manage Afib as above -Start lasix 20mg  daily for 3 days -Repeat BMET next week -Low Na diet -Daily weights  #ILD: #Wheezing: #History of Heavy Tobacco Use: Patient wheezy on examination in setting of volume overload as well as heavy history of tobacco use. CT also with ILD. Has not followed with pulm. -Refer to pulmonary at this time   Medication Adjustments/Labs and Tests Ordered: Current medicines are reviewed at length with the patient today.  Concerns regarding medicines are outlined above.   Orders Placed This Encounter  Procedures  . Basic metabolic panel  . Digoxin level  . Amb Referral to AFIB Clinic  . Ambulatory referral to Pulmonology  . EKG 12-Lead   Meds ordered this encounter  Medications  . furosemide (LASIX) 20 MG tablet    Sig: Take 1 tablet (20 mg total) by mouth daily. Take 1 tablet by mouth daily for 3 days    Dispense:  3 tablet    Refill:  0    Patient Instructions  Medication Instructions:  1) Start Digoxin 0.125 mg daily   2) Start Furosemide (Lasix) 20 mg daily for 3 days   *If you need a refill  on your cardiac medications before your next appointment, please call your pharmacy*   Lab Work: Bmp, Digoxin level to be done at visit with A-Fib Clinic   If you have labs (blood work) drawn today and your tests are completely normal, you will receive your results only by: Marland Kitchen MyChart Message (if you have MyChart) OR . A paper copy in the mail If you have any lab test that is abnormal or we need to change your treatment, we will call you to review the results.   Testing/Procedures: None ordered    Follow-Up: At [facility], you and your health needs are our priority.  As part of our continuing mission to provide you with exceptional heart care, we have created designated Provider Care Teams.  These Care Teams include your primary Cardiologist (physician) and Advanced Practice Providers (APPs -  Physician Assistants and Nurse Practitioners) who all work together to provide you with the care you need, when you need it.  We recommend signing up for the patient portal called "MyChart".  Sign up information is provided on this After Visit Summary.  MyChart is used to connect with patients for Virtual Visits (Telemedicine).  Patients are able to view lab/test results, encounter notes, upcoming appointments, etc.  Non-urgent messages can be sent to your provider as well.   To learn more about what you can do with MyChart, go to [email or web address].     Your next appointment:   1 month(s)  The format for your next appointment:   In Person  Provider:   You may see Dr. Gwyndolyn Kaufman or one of the following Advanced Practice Providers on your designated Care Team:    Richardson Dopp, PA-C  Robbie Lis, [place]    Other Instructions Your physician has referred you to [place]   Your have physician has referred you to to A-Fib Clinic ASAP     Signed, Freada Bergeron, MD  10/08/2020 10:46 AM    [place]

## 2020-10-02 ENCOUNTER — Telehealth: Payer: Self-pay | Admitting: Student

## 2020-10-02 NOTE — Telephone Encounter (Signed)
   Received page from Answering service from son Vita Erm, who is PA, with concern about his mom's atrial fibrillation. Patient recently seen by Roderic Palau, NP, in [facility] on 09/29/2020. Patient was in atrial fibrillation at that time with rates in the 120's. Toprol-XL 12.5mg  daily was added to Cardizem CD 240mg  daily for additional rate control. Patient has follow-up visit with Dr. Johney Frame on 10/08/2020.  Called and spoke with son. He states he does not think the Toprol has helped much. Patient still feels like she has elevated rates most of the time. Rates were in the 120's at times yesterday and in the high 60's at other times. However, son states rates seem to be more consistently elevated. BP has been stable with systolic BP in the 562'Z to 120's. She also has some shortness of breath when rates are elevated but son denies any significant shortness of breath. No chest pain, lightheadedness, dizziness, syncope. Advised increasing Toprol-XL to 25mg  daily. Patient is taking this at night. Discussed that although Toprol-XL is usually a once a day medications we can try first increasing to 12.5mg  twice daily. If she is tolerating this well, can consolidate to 25mg  once daily at visit with Dr. Johney Frame next week.   Advised son that if patient has any worsening shortness of breath, lightheadedness, dizziness, near syncope, etc, she should be seen in the ED. He voiced understanding and agreed.  Darreld Mclean, PA-C 10/02/2020 9:11 AM

## 2020-10-08 ENCOUNTER — Encounter: Payer: Self-pay | Admitting: Cardiology

## 2020-10-08 ENCOUNTER — Other Ambulatory Visit: Payer: Self-pay

## 2020-10-08 ENCOUNTER — Ambulatory Visit (INDEPENDENT_AMBULATORY_CARE_PROVIDER_SITE_OTHER): Payer: Medicare Other | Admitting: Cardiology

## 2020-10-08 VITALS — BP 100/72 | HR 100 | Ht 64.0 in | Wt 164.8 lb

## 2020-10-08 DIAGNOSIS — I5033 Acute on chronic diastolic (congestive) heart failure: Secondary | ICD-10-CM

## 2020-10-08 DIAGNOSIS — Z7901 Long term (current) use of anticoagulants: Secondary | ICD-10-CM | POA: Diagnosis not present

## 2020-10-08 DIAGNOSIS — Z79899 Other long term (current) drug therapy: Secondary | ICD-10-CM | POA: Diagnosis not present

## 2020-10-08 DIAGNOSIS — I480 Paroxysmal atrial fibrillation: Secondary | ICD-10-CM

## 2020-10-08 DIAGNOSIS — J449 Chronic obstructive pulmonary disease, unspecified: Secondary | ICD-10-CM

## 2020-10-08 MED ORDER — FUROSEMIDE 20 MG PO TABS: 20.0000 mg | ORAL_TABLET | Freq: Every day | ORAL | 0 refills | Status: DC

## 2020-10-08 NOTE — Patient Instructions (Addendum)
Medication Instructions:  1) Start Digoxin 0.125 mg daily   2) Start Furosemide (Lasix) 20 mg daily for 3 days   *If you need a refill on your cardiac medications before your next appointment, please call your pharmacy*   Lab Work: Bmp, Digoxin level to be done at visit with A-Fib Clinic   If you have labs (blood work) drawn today and your tests are completely normal, you will receive your results only by: Marland Kitchen MyChart Message (if you have MyChart) OR . A paper copy in the mail If you have any lab test that is abnormal or we need to change your treatment, we will call you to review the results.   Testing/Procedures: None ordered    Follow-Up: At [facility], you and your health needs are our priority.  As part of our continuing mission to provide you with exceptional heart care, we have created designated Provider Care Teams.  These Care Teams include your primary Cardiologist (physician) and Advanced Practice Providers (APPs -  Physician Assistants and Nurse Practitioners) who all work together to provide you with the care you need, when you need it.  We recommend signing up for the patient portal called "MyChart".  Sign up information is provided on this After Visit Summary.  MyChart is used to connect with patients for Virtual Visits (Telemedicine).  Patients are able to view lab/test results, encounter notes, upcoming appointments, etc.  Non-urgent messages can be sent to your provider as well.   To learn more about what you can do with MyChart, go to [email or web address].    Your next appointment:   1 month(s)  The format for your next appointment:   In Person  Provider:   You may see Dr. Gwyndolyn Kaufman or one of the following Advanced Practice Providers on your designated Care Team:    Richardson Dopp, PA-C  Robbie Lis, [place]    Other Instructions Your physician has referred you to [place] physician has referred you to see the A-Fib Clinic  ASAP

## 2020-10-11 MED ORDER — DIGOXIN 125 MCG PO TABS: 0.1250 mg | ORAL_TABLET | Freq: Every day | ORAL | 3 refills | Status: DC

## 2020-10-11 MED ORDER — METOPROLOL SUCCINATE ER 25 MG PO TB24: 12.5000 mg | ORAL_TABLET | Freq: Two times a day (BID) | ORAL | 3 refills | Status: DC

## 2020-10-11 NOTE — Addendum Note (Signed)
Addended by: Mendel Ryder on: 10/11/2020 11:56 AM   Modules accepted: Orders

## 2020-10-13 ENCOUNTER — Encounter (HOSPITAL_COMMUNITY): Payer: Self-pay | Admitting: Nurse Practitioner

## 2020-10-13 ENCOUNTER — Other Ambulatory Visit: Payer: Self-pay

## 2020-10-13 ENCOUNTER — Ambulatory Visit (HOSPITAL_COMMUNITY)
Admission: RE | Admit: 2020-10-13 | Discharge: 2020-10-13 | Disposition: A | Discharge status: Home / Self Care | Payer: Medicare Other | Source: Ambulatory Visit | Attending: Physician Assistant | Admitting: Physician Assistant

## 2020-10-13 VITALS — BP 160/76 | HR 99 | Ht 64.0 in | Wt 158.0 lb

## 2020-10-13 DIAGNOSIS — I4819 Other persistent atrial fibrillation: Secondary | ICD-10-CM

## 2020-10-13 DIAGNOSIS — Z87891 Personal history of nicotine dependence: Secondary | ICD-10-CM | POA: Insufficient documentation

## 2020-10-13 DIAGNOSIS — I4891 Unspecified atrial fibrillation: Secondary | ICD-10-CM | POA: Diagnosis not present

## 2020-10-13 DIAGNOSIS — Z7901 Long term (current) use of anticoagulants: Secondary | ICD-10-CM | POA: Diagnosis not present

## 2020-10-13 DIAGNOSIS — Z79899 Other long term (current) drug therapy: Secondary | ICD-10-CM | POA: Insufficient documentation

## 2020-10-13 DIAGNOSIS — R5383 Other fatigue: Secondary | ICD-10-CM | POA: Insufficient documentation

## 2020-10-13 LAB — BASIC METABOLIC PANEL
Anion gap: 9 (ref 5–15)
BUN: 14 mg/dL (ref 8–23)
CO2: 28 mmol/L (ref 22–32)
Calcium: 9.1 mg/dL (ref 8.9–10.3)
Chloride: 105 mmol/L (ref 98–111)
Creatinine, Ser: 0.98 mg/dL (ref 0.44–1.00)
GFR, Estimated: 55 mL/min — ABNORMAL LOW (ref >60)
Glucose, Bld: 92 mg/dL (ref 70–99)
Potassium: 4.7 mmol/L (ref 3.5–5.1)
Sodium: 142 mmol/L (ref 135–145)

## 2020-10-13 LAB — DIGOXIN LEVEL: Digoxin Level: 0.7 ng/mL — ABNORMAL LOW (ref 0.8–2.0)

## 2020-10-13 MED ORDER — POTASSIUM CHLORIDE ER 10 MEQ PO TBCR: 10.0000 meq | EXTENDED_RELEASE_TABLET | Freq: Every day | ORAL | 3 refills | Status: DC

## 2020-10-13 MED ORDER — FUROSEMIDE 20 MG PO TABS: 20.0000 mg | ORAL_TABLET | Freq: Every day | ORAL | 2 refills | Status: DC

## 2020-10-13 NOTE — Patient Instructions (Signed)
Continue Lasix 20mg  once a day Start Potassium (KDur) 21meq once a day

## 2020-10-13 NOTE — Progress Notes (Addendum)
Primary Care Physician: Lajean Manes, MD Referring Physician: [facility] f/u    Peggy Williams is a 87 y.o. female with a h/o afib that was first dx in November in the setting of shingles. She was hospitalized 11/6 thur 07/19/20 and was seen in f/u with Truitt Merle, NP, 09/16/19. She was doing well being in SR. She was then seen in the ER 1/15 for afib with RVR, at 137 bpm,  and was cardioverted. She  is being seen in f/u here and is back  in afib at 127 bpm.  She is fatigued in afib. She  lives independently and has enjoyed good health. She  is quite HOH. She is here with her son who is a PA, and he desires conservative therapy. She drinks minimal to  moderate caffeine, 2 alcoholic drinks in the afternoons. No known issues with snoring/apnea, no tobacco use. She is being compliant with eliquis 5 mg daily. CHA2DS2VASc score is 5.  She is pending an appointment with Dr. Johney Frame 1/28.  F/u in afib clinic, 10/13/20. She was seen by Dr. Johney Frame 1/28. She  presented with fluid overload and RVR. She was started on lasix and digoxin but there was a delay with the prescriptions and she started on dig and lasix on Saturday, but only had 3 days of lasix and has been off the med for several days. She developed weeping of the LE's and the dressings have been managed by the nurse at the long term facility.  She has been feeling somewhat  better for the last couple of days but still has fatigue/weakness since she has  been in persistent afib.   Amiodarone is being avoided due to presence of ILD on chest CT. Her qt is SR is long at 473 ms and with her crcl cal is 40 so I don't see a clear path for AAD.   Today, she denies symptoms of palpitations, chest pain, shortness of breath, orthopnea, PND, lower extremity edema, dizziness, presyncope, syncope, or neurologic sequela. The patient is tolerating medications without difficulties and is otherwise without complaint today.   Past Medical History:  Diagnosis Date  .  Arthritis   . Cataract   . Stroke [facility])    complication of aneurysm repair  . Thyroid disease    Past Surgical History:  Procedure Laterality Date  . ABDOMINAL HYSTERECTOMY    . BRAIN SURGERY    . EYE SURGERY    . JOINT REPLACEMENT      Current Outpatient Medications  Medication Sig Dispense Refill  . apixaban (ELIQUIS) 5 MG TABS tablet Take 1 tablet (5 mg total) by mouth 2 (two) times daily. 180 tablet 3  . digoxin (LANOXIN) 0.125 MG tablet Take 1 tablet (0.125 mg total) by mouth daily. 90 tablet 3  . diltiazem (CARDIZEM CD) 240 MG 24 hr capsule Take 1 capsule (240 mg total) by mouth daily. 90 capsule 3  . levothyroxine (SYNTHROID, LEVOTHROID) 50 MCG tablet Take 50 mcg by mouth daily before breakfast.    . metoprolol succinate (TOPROL-XL) 25 MG 24 hr tablet Take 0.5 tablets (12.5 mg total) by mouth in the morning and at bedtime. 90 tablet 3  . Multiple Vitamins-Minerals (OCUVITE ADULT 50+) CAPS Take 1 capsule by mouth daily in the afternoon.    . potassium chloride (KLOR-CON) 10 MEQ tablet Take 1 tablet (10 mEq total) by mouth daily. 30 tablet 3  . Vitamin D-Vitamin K (VITAMIN K2-VITAMIN D3) 45-2000 MCG-UNIT CAPS Take 1 capsule by mouth daily in  the afternoon.    . furosemide (LASIX) 20 MG tablet Take 1 tablet (20 mg total) by mouth daily. 30 tablet 2   No current facility-administered medications for this encounter.    No Known Allergies  Social History   Socioeconomic History  . Marital status: Widowed    Spouse name: Not on file  . Number of children: Not on file  . Years of education: Not on file  . Highest education level: Not on file  Occupational History  . Occupation: retired  Tobacco Use  . Smoking status: Former Smoker    Packs/day: 1.00    Years: 45.00    Pack years: 45.00    Types: Cigarettes    Quit date: 2019    Years since quitting: 3.0  . Smokeless tobacco: Never Used  Substance and Sexual Activity  . Alcohol use: No  . Drug use: No  . Sexual  activity: Never  Other Topics Concern  . Not on file  Social History Narrative  . Not on file   Social Determinants of Health   Financial Resource Strain: Not on file  Food Insecurity: Not on file  Transportation Needs: Not on file  Physical Activity: Not on file  Stress: Not on file  Social Connections: Not on file  Intimate Partner Violence: Not on file    No family history on file.  ROS- All systems are reviewed and negative except as per the HPI above  Physical Exam: Vitals:   10/13/20 1447  BP: (!) 160/76  Pulse: 99  Weight: 71.7 kg  Height: 5\' 4"  (1.626 m)   Wt Readings from Last 3 Encounters:  10/13/20 71.7 kg  10/08/20 74.8 kg  09/29/20 71.8 kg    Labs: Lab Results  Component Value Date   NA 142 09/25/2020   K 4.2 09/25/2020   CL 107 09/25/2020   CO2 26 09/25/2020   GLUCOSE 125 (H) 09/25/2020   BUN 13 09/25/2020   CREATININE 1.05 (H) 09/25/2020   CALCIUM 9.0 09/25/2020   No results found for: INR No results found for: CHOL, HDL, LDLCALC, TRIG   GEN- The patient is well appearing, alert and oriented x 3 today.   Head- normocephalic, atraumatic Eyes-  Sclera clear, conjunctiva pink Ears- hearing intact Oropharynx- clear Neck- supple, no JVP Lymph- no cervical lymphadenopathy Lungs- few crackles in the bases, normal work of breathing Heart- irregular rate and rhythm, no murmurs, rubs or gallops, PMI not laterally displaced GI- soft, NT, ND, + BS Extremities- no clubbing, cyanosis, or   +1 edema bilaterally with dressing present MS- no significant deformity or atrophy Skin- no rash or lesion Psych- euthymic mood, full affect Neuro- strength and sensation are intact  EKG-afib at  99  bpm, qrs int 84 ms, qtc 423  ms  Echo-1. Left ventricular ejection fraction, by estimation, is 65 to 70%. The  left ventricle has normal function. The left ventricle has no regional  wall motion abnormalities. Left ventricular diastolic function could not  be  evaluated.  2. Right ventricular systolic function is normal. The right ventricular  size is normal. There is mildly elevated pulmonary artery systolic  pressure. The estimated right ventricular systolic pressure is 96.2 mmHg.  3. Left atrial size was mildly dilated.  4. The mitral valve is normal in structure. Mild to moderate mitral valve  regurgitation. No evidence of mitral stenosis.  5. Tricuspid valve regurgitation is moderate.  6. The aortic valve is normal in structure. Aortic valve regurgitation is  trivial. Mild to moderate aortic valve sclerosis/calcification is present,  without any evidence of aortic stenosis.  7. The inferior vena cava is normal in size with greater than 50%  respiratory variability, suggesting right atrial pressure of 3 mmHg.   Chest CT-07/17/20-IMPRESSION: 1. No evidence for acute pulmonary embolus. 2. Multifocal bilateral lower lung zone predominant areas of consolidation and ground-glass attenuation compatible with inflammation/infection. Short-term follow-up imaging in 3 months is recommended following appropriate antibiotic therapy and resolution of any symptoms. 3. Coronary artery calcifications noted. 4. Bilateral hilar and mediastinal adenopathy. This is a nonspecific finding in the setting of pneumonia and may be reactive in etiology. Attention on 3 month follow-up chest CT is recommended to ensure resolution. 5. Scattered nonspecific milli metric lung nodules are noted bilaterally which are likely postinflammatory/infectious in etiology. Attention on follow-up imaging is advised. 6. Nonobstructing left renal calculi. 7. Aortic atherosclerosis.  Assessment and Plan: 1. New onset  afib 11/21 Initial return to SR with [facility]  Return  of afib 1/15 Successful  cardioversion 1/15 with ERAF Son feels that it may have not lasted but one day  Discussed  options Son would like a conservative approach For now will continue Cardizem 240 mg  daily/ metoprolol 12.5 mg bid/digoxin 0.125 mg qd   Continue lasix 20 mg daily with 10 meq kcl, nurse at  facility will continue to wrap LE's for LE edema  V/S are being watched by nurse at pt's facility as well I hesitate to use amiodarone as ground glass attenuation was noted on chest CT and I hear some crackles in base of lungs that sounds compatible for possible interstitial  lung disease, f/u ct of chest was recommended in 3 months   I feel Phyllis Ginger may not be a good option as well as qtc at baseline is 473 ms with crcl cal at 40 ml/min She also has thyroid issues at baseline which amiodarone may exacerbate  Some HR's are been in the 50's with swings to 100-110 bpm, also some soft BP's at home that make it difficult to up titrate rate control meds   She will decrease alcohol use  to no more than 2 drinks a week  Bmet/dig level today  2. CHA2DS2VASc score of 5 Continue eliuis 5 mg bid   Bleeding precautions reviewed    Discussed with Dr. Rayann Heman and he encouraged efforts to rate control and diurese pt. If continues to be difficult to rate control then refer to Dr. Lovena Le for consideration of PPM/ AV nodal ablation.   I will see back in one week Symptoms that would indicate worsening of condition and proceeding to the ER reviewed with son   Geroge Baseman. Megham Dwyer, [facility] [address] [place], [place] [address] [phone]

## 2020-10-14 ENCOUNTER — Encounter (HOSPITAL_COMMUNITY): Payer: Self-pay

## 2020-10-15 ENCOUNTER — Ambulatory Visit (HOSPITAL_COMMUNITY): Payer: Medicare Other | Admitting: Physician Assistant

## 2020-10-19 DIAGNOSIS — I69320 Aphasia following cerebral infarction: Secondary | ICD-10-CM | POA: Diagnosis not present

## 2020-10-19 DIAGNOSIS — Z7901 Long term (current) use of anticoagulants: Secondary | ICD-10-CM | POA: Diagnosis not present

## 2020-10-19 DIAGNOSIS — Z87891 Personal history of nicotine dependence: Secondary | ICD-10-CM | POA: Diagnosis not present

## 2020-10-19 DIAGNOSIS — M1990 Unspecified osteoarthritis, unspecified site: Secondary | ICD-10-CM | POA: Diagnosis not present

## 2020-10-19 DIAGNOSIS — L97329 Non-pressure chronic ulcer of left ankle with unspecified severity: Secondary | ICD-10-CM | POA: Diagnosis not present

## 2020-10-19 DIAGNOSIS — I4819 Other persistent atrial fibrillation: Secondary | ICD-10-CM | POA: Diagnosis not present

## 2020-10-19 DIAGNOSIS — I69351 Hemiplegia and hemiparesis following cerebral infarction affecting right dominant side: Secondary | ICD-10-CM | POA: Diagnosis not present

## 2020-10-19 DIAGNOSIS — I10 Essential (primary) hypertension: Secondary | ICD-10-CM | POA: Diagnosis not present

## 2020-10-19 DIAGNOSIS — I700 Atherosclerosis of aorta: Secondary | ICD-10-CM | POA: Diagnosis not present

## 2020-10-19 DIAGNOSIS — M8580 Other specified disorders of bone density and structure, unspecified site: Secondary | ICD-10-CM | POA: Diagnosis not present

## 2020-10-19 DIAGNOSIS — I872 Venous insufficiency (chronic) (peripheral): Secondary | ICD-10-CM | POA: Diagnosis not present

## 2020-10-19 DIAGNOSIS — E039 Hypothyroidism, unspecified: Secondary | ICD-10-CM | POA: Diagnosis not present

## 2020-10-21 ENCOUNTER — Encounter (HOSPITAL_COMMUNITY): Payer: Self-pay | Admitting: Nurse Practitioner

## 2020-10-21 ENCOUNTER — Other Ambulatory Visit: Payer: Self-pay

## 2020-10-21 ENCOUNTER — Ambulatory Visit (HOSPITAL_COMMUNITY)
Admission: RE | Admit: 2020-10-21 | Discharge: 2020-10-21 | Disposition: A | Discharge status: Home / Self Care | Payer: Medicare Other | Source: Ambulatory Visit | Attending: Nurse Practitioner | Admitting: Nurse Practitioner

## 2020-10-21 VITALS — BP 124/80 | HR 106 | Ht 64.0 in | Wt 148.2 lb

## 2020-10-21 DIAGNOSIS — Z7901 Long term (current) use of anticoagulants: Secondary | ICD-10-CM | POA: Diagnosis not present

## 2020-10-21 DIAGNOSIS — Z91138 Patient's unintentional underdosing of medication regimen for other reason: Secondary | ICD-10-CM | POA: Insufficient documentation

## 2020-10-21 DIAGNOSIS — Z87891 Personal history of nicotine dependence: Secondary | ICD-10-CM | POA: Diagnosis not present

## 2020-10-21 DIAGNOSIS — I4891 Unspecified atrial fibrillation: Secondary | ICD-10-CM | POA: Insufficient documentation

## 2020-10-21 DIAGNOSIS — D6869 Other thrombophilia: Secondary | ICD-10-CM

## 2020-10-21 DIAGNOSIS — I69320 Aphasia following cerebral infarction: Secondary | ICD-10-CM | POA: Diagnosis not present

## 2020-10-21 DIAGNOSIS — I4819 Other persistent atrial fibrillation: Secondary | ICD-10-CM

## 2020-10-21 DIAGNOSIS — I10 Essential (primary) hypertension: Secondary | ICD-10-CM | POA: Diagnosis not present

## 2020-10-21 DIAGNOSIS — Z79890 Hormone replacement therapy (postmenopausal): Secondary | ICD-10-CM | POA: Insufficient documentation

## 2020-10-21 DIAGNOSIS — I700 Atherosclerosis of aorta: Secondary | ICD-10-CM | POA: Diagnosis not present

## 2020-10-21 DIAGNOSIS — Z79899 Other long term (current) drug therapy: Secondary | ICD-10-CM | POA: Insufficient documentation

## 2020-10-21 DIAGNOSIS — L97329 Non-pressure chronic ulcer of left ankle with unspecified severity: Secondary | ICD-10-CM | POA: Diagnosis not present

## 2020-10-21 DIAGNOSIS — I872 Venous insufficiency (chronic) (peripheral): Secondary | ICD-10-CM | POA: Diagnosis not present

## 2020-10-21 LAB — DIGOXIN LEVEL: Digoxin Level: 0.6 ng/mL — ABNORMAL LOW (ref 0.8–2.0)

## 2020-10-21 LAB — BASIC METABOLIC PANEL
Anion gap: 13 (ref 5–15)
BUN: 15 mg/dL (ref 8–23)
CO2: 23 mmol/L (ref 22–32)
Calcium: 9.4 mg/dL (ref 8.9–10.3)
Chloride: 104 mmol/L (ref 98–111)
Creatinine, Ser: 1.01 mg/dL — ABNORMAL HIGH (ref 0.44–1.00)
GFR, Estimated: 53 mL/min — ABNORMAL LOW (ref >60)
Glucose, Bld: 93 mg/dL (ref 70–99)
Potassium: 5.8 mmol/L — ABNORMAL HIGH (ref 3.5–5.1)
Sodium: 140 mmol/L (ref 135–145)

## 2020-10-21 MED ORDER — METOPROLOL SUCCINATE ER 25 MG PO TB24: 12.5000 mg | ORAL_TABLET | Freq: Two times a day (BID) | ORAL | 3 refills | Status: DC

## 2020-10-21 NOTE — Progress Notes (Signed)
Primary Care Physician: Lajean Manes, MD Referring Physician: [facility] f/u    Peggy Williams is a 87 y.o. female with a h/o afib that was first dx in November in the setting of shingles. She was hospitalized 11/6 thur 07/19/20 and was seen in f/u with Truitt Merle, NP, 09/16/19. She was doing well being in SR. She was then seen in the ER 1/15 for afib with RVR, at 137 bpm,  and was cardioverted. She  is being seen in f/u here and is back  in afib at 127 bpm.  She is fatigued in afib. She  lives independently and has enjoyed good health. She  is quite HOH. She is here with her son who is a PA, and he desires conservative therapy. She drinks minimal to  moderate caffeine, 2 alcoholic drinks in the afternoons. No known issues with snoring/apnea, no tobacco use. She is being compliant with eliquis 5 mg daily. CHA2DS2VASc score is 5.  She is pending an appointment with Dr. Johney Frame 1/28.  F/u in afib clinic, 10/13/20. She was seen by Dr. Johney Frame 1/28. She  presented with fluid overload and RVR. She was started on lasix and digoxin but there was a delay with the prescriptions and she started on dig and lasix on Saturday, but only had 3 days of lasix and has been off the med for several days. She developed weeping of the LE's and the dressings have been managed by the nurse at the long term facility.  She has been feeling somewhat  better for the last couple of days but still has fatigue/weakness since she has  been in persistent afib.   Amiodarone is being avoided due to presence of ILD on chest CT. Her qt is SR is long at 473 ms and with her crcl cal is 40 so I don't see a clear path for AAD.  F/u in afib clinic, 10/20/20,  she has lost from 164.5 to 157.7 over the last week on 20 mg lasix daily.  Has no weeping from lower legs now. Her HR is better controlled. Ekg shows afib at 106 bpm, but at home, the son reports her heart rates  are controlled, 80's/90's, rarely  over 100 bpm. According to the son,  she has more  energy and has taken some short walks. No dizziness/lightheadedness.   Today, she denies symptoms of palpitations, chest pain, shortness of breath, orthopnea, PND, lower extremity edema, dizziness, presyncope, syncope, or neurologic sequela. The patient is tolerating medications without difficulties and is otherwise without complaint today.   Past Medical History:  Diagnosis Date  . Arthritis   . Cataract   . Stroke [facility])    complication of aneurysm repair  . Thyroid disease    Past Surgical History:  Procedure Laterality Date  . ABDOMINAL HYSTERECTOMY    . BRAIN SURGERY    . EYE SURGERY    . JOINT REPLACEMENT      Current Outpatient Medications  Medication Sig Dispense Refill  . apixaban (ELIQUIS) 5 MG TABS tablet Take 1 tablet (5 mg total) by mouth 2 (two) times daily. 180 tablet 3  . digoxin (LANOXIN) 0.125 MG tablet Take 1 tablet (0.125 mg total) by mouth daily. 90 tablet 3  . diltiazem (CARDIZEM CD) 240 MG 24 hr capsule Take 1 capsule (240 mg total) by mouth daily. 90 capsule 3  . furosemide (LASIX) 20 MG tablet Take 1 tablet (20 mg total) by mouth daily. 30 tablet 2  . levothyroxine (SYNTHROID, LEVOTHROID) 50  MCG tablet Take 50 mcg by mouth daily before breakfast.    . metoprolol succinate (TOPROL-XL) 25 MG 24 hr tablet Take 0.5 tablets (12.5 mg total) by mouth in the morning and at bedtime. 90 tablet 3  . Multiple Vitamins-Minerals (OCUVITE ADULT 50+) CAPS Take 1 capsule by mouth daily in the afternoon.    . potassium chloride (KLOR-CON) 10 MEQ tablet Take 1 tablet (10 mEq total) by mouth daily. 30 tablet 3  . Vitamin D-Vitamin K (VITAMIN K2-VITAMIN D3) 45-2000 MCG-UNIT CAPS Take 1 capsule by mouth daily in the afternoon.     No current facility-administered medications for this encounter.    No Known Allergies  Social History   Socioeconomic History  . Marital status: Widowed    Spouse name: Not on file  . Number of children: Not on file  . Years of education: Not  on file  . Highest education level: Not on file  Occupational History  . Occupation: retired  Tobacco Use  . Smoking status: Former Smoker    Packs/day: 1.00    Years: 45.00    Pack years: 45.00    Types: Cigarettes    Quit date: 2019    Years since quitting: 3.1  . Smokeless tobacco: Never Used  Substance and Sexual Activity  . Alcohol use: No  . Drug use: No  . Sexual activity: Never  Other Topics Concern  . Not on file  Social History Narrative  . Not on file   Social Determinants of Health   Financial Resource Strain: Not on file  Food Insecurity: Not on file  Transportation Needs: Not on file  Physical Activity: Not on file  Stress: Not on file  Social Connections: Not on file  Intimate Partner Violence: Not on file    No family history on file.  ROS- All systems are reviewed and negative except as per the HPI above  Physical Exam: There were no vitals filed for this visit. Wt Readings from Last 3 Encounters:  10/13/20 71.7 kg  10/08/20 74.8 kg  09/29/20 71.8 kg    Labs: Lab Results  Component Value Date   NA 142 10/13/2020   K 4.7 10/13/2020   CL 105 10/13/2020   CO2 28 10/13/2020   GLUCOSE 92 10/13/2020   BUN 14 10/13/2020   CREATININE 0.98 10/13/2020   CALCIUM 9.1 10/13/2020   No results found for: INR No results found for: CHOL, HDL, LDLCALC, TRIG   GEN- The patient is well appearing, alert and oriented x 3 today.   Head- normocephalic, atraumatic Eyes-  Sclera clear, conjunctiva pink Ears- hearing intact Oropharynx- clear Neck- supple, no JVP Lymph- no cervical lymphadenopathy Lungs- few crackles in the bases, normal work of breathing Heart- irregular rate and rhythm, no murmurs, rubs or gallops, PMI not laterally displaced GI- soft, NT, ND, + BS Extremities- no clubbing, cyanosis, trace edema MS- no significant deformity or atrophy Skin- no rash or lesion Psych- euthymic mood, full affect Neuro- strength and sensation are  intact  EKG- afib at 106 bpm, qrs int 84, qtc 414 ms   Echo-1. Left ventricular ejection fraction, by estimation, is 65 to 70%. The  left ventricle has normal function. The left ventricle has no regional  wall motion abnormalities. Left ventricular diastolic function could not  be evaluated.  2. Right ventricular systolic function is normal. The right ventricular  size is normal. There is mildly elevated pulmonary artery systolic  pressure. The estimated right ventricular systolic pressure is 40.5  mmHg.  3. Left atrial size was mildly dilated.  4. The mitral valve is normal in structure. Mild to moderate mitral valve  regurgitation. No evidence of mitral stenosis.  5. Tricuspid valve regurgitation is moderate.  6. The aortic valve is normal in structure. Aortic valve regurgitation is  trivial. Mild to moderate aortic valve sclerosis/calcification is present,  without any evidence of aortic stenosis.  7. The inferior vena cava is normal in size with greater than 50%  respiratory variability, suggesting right atrial pressure of 3 mmHg.   Chest CT-07/17/20-IMPRESSION: 1. No evidence for acute pulmonary embolus. 2. Multifocal bilateral lower lung zone predominant areas of consolidation and ground-glass attenuation compatible with inflammation/infection. Short-term follow-up imaging in 3 months is recommended following appropriate antibiotic therapy and resolution of any symptoms. 3. Coronary artery calcifications noted. 4. Bilateral hilar and mediastinal adenopathy. This is a nonspecific finding in the setting of pneumonia and may be reactive in etiology. Attention on 3 month follow-up chest CT is recommended to ensure resolution. 5. Scattered nonspecific milli metric lung nodules are noted bilaterally which are likely postinflammatory/infectious in etiology. Attention on follow-up imaging is advised. 6. Nonobstructing left renal calculi. 7. Aortic atherosclerosis.  Assessment  and Plan: 1. New onset  afib 07/31/20 Initial return to SR with [facility]  Return  of afib 1/15 Successful  cardioversion 1/15 with ERAF Son feels that it may have not lasted but one day  Discussed options on previous  note  Son would like a conservative approach I feel she is showing signs of improvement today  For now will continue Cardizem 240 mg daily/ metoprolol 12.5 mg bid/digoxin 0.125 mg qd, she has missed several doses of BB for med refill issues at the drugstore although on our side shows escript is there with refills  Discussed with PharmD in person.For some reason they did not get refill on BB when dose increased and we are not getting their ecript requests, ? some type of glitche in the system.  All rx were reviewed with the pharmacist and the refills are there  Continue lasix 20 mg daily with 10 meq kcl  V/S are being watched by nurse at pt's facility and reviewed by son  I hesitate to use amiodarone as ground glass attenuation was noted on chest CT and I hear some crackles in base of lungs that sounds compatible for possible interstitial  lung disease   I feel Peggy Williams may not be a good option as well as qtc at baseline is 473 ms with crcl cal at 40 ml/min She also has thyroid issues at baseline which amiodarone may exacerbate   She has stopped  alcohol use and cut back on caffeine  Bmet/dig level today  2. CHA2DS2VASc score of 5 Continue eliuis 5 mg bid   Bleeding precautions reviewed   I will see back in 2 weeks   Peggy Williams, [facility] [address] [place], [place] [address] [phone]

## 2020-10-22 DIAGNOSIS — I4819 Other persistent atrial fibrillation: Secondary | ICD-10-CM | POA: Diagnosis not present

## 2020-10-22 DIAGNOSIS — I700 Atherosclerosis of aorta: Secondary | ICD-10-CM | POA: Diagnosis not present

## 2020-10-22 DIAGNOSIS — I69320 Aphasia following cerebral infarction: Secondary | ICD-10-CM | POA: Diagnosis not present

## 2020-10-22 DIAGNOSIS — L97329 Non-pressure chronic ulcer of left ankle with unspecified severity: Secondary | ICD-10-CM | POA: Diagnosis not present

## 2020-10-22 DIAGNOSIS — I10 Essential (primary) hypertension: Secondary | ICD-10-CM | POA: Diagnosis not present

## 2020-10-22 DIAGNOSIS — I872 Venous insufficiency (chronic) (peripheral): Secondary | ICD-10-CM | POA: Diagnosis not present

## 2020-10-26 DIAGNOSIS — I872 Venous insufficiency (chronic) (peripheral): Secondary | ICD-10-CM | POA: Diagnosis not present

## 2020-10-26 DIAGNOSIS — I700 Atherosclerosis of aorta: Secondary | ICD-10-CM | POA: Diagnosis not present

## 2020-10-26 DIAGNOSIS — I69320 Aphasia following cerebral infarction: Secondary | ICD-10-CM | POA: Diagnosis not present

## 2020-10-26 DIAGNOSIS — I10 Essential (primary) hypertension: Secondary | ICD-10-CM | POA: Diagnosis not present

## 2020-10-26 DIAGNOSIS — L97329 Non-pressure chronic ulcer of left ankle with unspecified severity: Secondary | ICD-10-CM | POA: Diagnosis not present

## 2020-10-26 DIAGNOSIS — I4819 Other persistent atrial fibrillation: Secondary | ICD-10-CM | POA: Diagnosis not present

## 2020-10-28 DIAGNOSIS — I872 Venous insufficiency (chronic) (peripheral): Secondary | ICD-10-CM | POA: Diagnosis not present

## 2020-10-28 DIAGNOSIS — I700 Atherosclerosis of aorta: Secondary | ICD-10-CM | POA: Diagnosis not present

## 2020-10-28 DIAGNOSIS — I69320 Aphasia following cerebral infarction: Secondary | ICD-10-CM | POA: Diagnosis not present

## 2020-10-28 DIAGNOSIS — L97329 Non-pressure chronic ulcer of left ankle with unspecified severity: Secondary | ICD-10-CM | POA: Diagnosis not present

## 2020-10-28 DIAGNOSIS — I4819 Other persistent atrial fibrillation: Secondary | ICD-10-CM | POA: Diagnosis not present

## 2020-10-28 DIAGNOSIS — I10 Essential (primary) hypertension: Secondary | ICD-10-CM | POA: Diagnosis not present

## 2020-11-03 ENCOUNTER — Ambulatory Visit: Payer: Medicare Other | Admitting: Physician Assistant

## 2020-11-04 ENCOUNTER — Ambulatory Visit (HOSPITAL_COMMUNITY)
Admission: RE | Admit: 2020-11-04 | Discharge: 2020-11-04 | Disposition: A | Discharge status: Home / Self Care | Payer: Medicare Other | Source: Ambulatory Visit | Attending: Nurse Practitioner | Admitting: Nurse Practitioner

## 2020-11-04 ENCOUNTER — Other Ambulatory Visit: Payer: Self-pay

## 2020-11-04 ENCOUNTER — Encounter (HOSPITAL_COMMUNITY): Payer: Self-pay | Admitting: Nurse Practitioner

## 2020-11-04 VITALS — BP 92/62 | HR 66 | Ht 64.0 in | Wt 145.8 lb

## 2020-11-04 DIAGNOSIS — I69320 Aphasia following cerebral infarction: Secondary | ICD-10-CM | POA: Diagnosis not present

## 2020-11-04 DIAGNOSIS — Z79890 Hormone replacement therapy (postmenopausal): Secondary | ICD-10-CM | POA: Insufficient documentation

## 2020-11-04 DIAGNOSIS — D6869 Other thrombophilia: Secondary | ICD-10-CM

## 2020-11-04 DIAGNOSIS — Z79899 Other long term (current) drug therapy: Secondary | ICD-10-CM | POA: Diagnosis not present

## 2020-11-04 DIAGNOSIS — I872 Venous insufficiency (chronic) (peripheral): Secondary | ICD-10-CM | POA: Diagnosis not present

## 2020-11-04 DIAGNOSIS — I700 Atherosclerosis of aorta: Secondary | ICD-10-CM | POA: Insufficient documentation

## 2020-11-04 DIAGNOSIS — I4819 Other persistent atrial fibrillation: Secondary | ICD-10-CM

## 2020-11-04 DIAGNOSIS — Z8661 Personal history of infections of the central nervous system: Secondary | ICD-10-CM | POA: Diagnosis not present

## 2020-11-04 DIAGNOSIS — Z87891 Personal history of nicotine dependence: Secondary | ICD-10-CM | POA: Insufficient documentation

## 2020-11-04 DIAGNOSIS — I10 Essential (primary) hypertension: Secondary | ICD-10-CM | POA: Diagnosis not present

## 2020-11-04 DIAGNOSIS — L97329 Non-pressure chronic ulcer of left ankle with unspecified severity: Secondary | ICD-10-CM | POA: Diagnosis not present

## 2020-11-04 DIAGNOSIS — Z7901 Long term (current) use of anticoagulants: Secondary | ICD-10-CM | POA: Insufficient documentation

## 2020-11-04 DIAGNOSIS — I4891 Unspecified atrial fibrillation: Secondary | ICD-10-CM | POA: Insufficient documentation

## 2020-11-04 LAB — BASIC METABOLIC PANEL
Anion gap: 11 (ref 5–15)
BUN: 16 mg/dL (ref 8–23)
CO2: 27 mmol/L (ref 22–32)
Calcium: 9.8 mg/dL (ref 8.9–10.3)
Chloride: 105 mmol/L (ref 98–111)
Creatinine, Ser: 1.13 mg/dL — ABNORMAL HIGH (ref 0.44–1.00)
GFR, Estimated: 47 mL/min — ABNORMAL LOW (ref >60)
Glucose, Bld: 87 mg/dL (ref 70–99)
Potassium: 4.7 mmol/L (ref 3.5–5.1)
Sodium: 143 mmol/L (ref 135–145)

## 2020-11-04 NOTE — Progress Notes (Addendum)
Primary Care Physician: Lajean Manes, MD Referring Physician: [facility] f/u    Peggy Williams is a 87 y.o. female with a h/o afib that was first dx in November in the setting of shingles. She was hospitalized 11/6 thur 07/19/20 and was seen in f/u with Truitt Merle, NP, 09/16/19. She was doing well being in SR. She was then seen in the ER 1/15 for afib with RVR, at 137 bpm,  and was cardioverted. She  is being seen in f/u here and is back  in afib at 127 bpm.  She is fatigued in afib. She  lives independently and has enjoyed good health. She  is quite HOH. She is here with her son who is a PA, and he desires conservative therapy. She drinks minimal to  moderate caffeine, 2 alcoholic drinks in the afternoons. No known issues with snoring/apnea, no tobacco use. She is being compliant with eliquis 5 mg daily. CHA2DS2VASc score is 5.  She is pending an appointment with Dr. Johney Frame 1/28.  F/u in afib clinic, 10/13/20. She was seen by Dr. Johney Frame 1/28. She  presented with fluid overload and RVR. She was started on lasix and digoxin but there was a delay with the prescriptions and she started on dig and lasix on Saturday, but only had 3 days of lasix and has been off the med for several days. She developed weeping of the LE's and the dressings have been managed by the nurse at the long term facility.  She has been feeling somewhat  better for the last couple of days but still has fatigue/weakness since she has  been in persistent afib.   Amiodarone is being avoided due to presence of ILD on chest CT. Her qt is SR is long at 473 ms and with her crcl cal is 40 so I don't see a clear path for AAD.  F/u in afib clinic, 10/20/20,  she has lost from 164.5 to 157.7 over the last week on 20 mg lasix daily.  Has no weeping from lower legs now. Her HR is better controlled. Ekg shows afib at 106 bpm, but at home, the son reports her heart rates  are controlled, 80's/90's, rarely  over 100 bpm. According to the son,  she has more  energy and has taken some short walks. No dizziness/lightheadedness.   F/u 11/05/20. She is much improved! The weeping has stopped in her lower legs. The skin lesions have healed. Her HR is nicely controlled. Her BP continues to be soft but she is not symptomatic with this. She has had more energy and stamina and is walking without shortness of breath. Has returned to going  out to eat with family. Her son is very pleased with her condition. She has lost another 2 lbs since last visit. Her V/S are closely watched by family,staff and PT at her facility  Today, she denies symptoms of palpitations, chest pain, shortness of breath, orthopnea, PND, lower extremity edema, dizziness, presyncope, syncope, or neurologic sequela. The patient is tolerating medications without difficulties and is otherwise without complaint today.   Past Medical History:  Diagnosis Date  . Arthritis   . Cataract   . Stroke [facility])    complication of aneurysm repair  . Thyroid disease    Past Surgical History:  Procedure Laterality Date  . ABDOMINAL HYSTERECTOMY    . BRAIN SURGERY    . EYE SURGERY    . JOINT REPLACEMENT      Current Outpatient Medications  Medication Sig  Dispense Refill  . apixaban (ELIQUIS) 5 MG TABS tablet Take 1 tablet (5 mg total) by mouth 2 (two) times daily. 180 tablet 3  . digoxin (LANOXIN) 0.125 MG tablet Take 1 tablet (0.125 mg total) by mouth daily. 90 tablet 3  . diltiazem (CARDIZEM CD) 240 MG 24 hr capsule Take 1 capsule (240 mg total) by mouth daily. 90 capsule 3  . furosemide (LASIX) 20 MG tablet Take 1 tablet (20 mg total) by mouth daily. 30 tablet 2  . levothyroxine (SYNTHROID, LEVOTHROID) 50 MCG tablet Take 50 mcg by mouth daily before breakfast.    . metoprolol succinate (TOPROL-XL) 25 MG 24 hr tablet Take 0.5 tablets (12.5 mg total) by mouth in the morning and at bedtime. 90 tablet 3  . Multiple Vitamins-Minerals (OCUVITE ADULT 50+) CAPS Take 1 capsule by mouth daily in the  afternoon.    . potassium chloride (KLOR-CON) 10 MEQ tablet Take 1 tablet (10 mEq total) by mouth daily. 30 tablet 3  . Vitamin D-Vitamin K (VITAMIN K2-VITAMIN D3) 45-2000 MCG-UNIT CAPS Take 1 capsule by mouth daily in the afternoon.     No current facility-administered medications for this encounter.    No Known Allergies  Social History   Socioeconomic History  . Marital status: Widowed    Spouse name: Not on file  . Number of children: Not on file  . Years of education: Not on file  . Highest education level: Not on file  Occupational History  . Occupation: retired  Tobacco Use  . Smoking status: Former Smoker    Packs/day: 1.00    Years: 45.00    Pack years: 45.00    Types: Cigarettes    Quit date: 2019    Years since quitting: 3.1  . Smokeless tobacco: Never Used  Substance and Sexual Activity  . Alcohol use: No  . Drug use: No  . Sexual activity: Never  Other Topics Concern  . Not on file  Social History Narrative  . Not on file   Social Determinants of Health   Financial Resource Strain: Not on file  Food Insecurity: Not on file  Transportation Needs: Not on file  Physical Activity: Not on file  Stress: Not on file  Social Connections: Not on file  Intimate Partner Violence: Not on file    No family history on file.  ROS- All systems are reviewed and negative except as per the HPI above  Physical Exam: Vitals:   11/04/20 1031  BP: 92/62  Pulse: 66  Weight: 66.1 kg  Height: 5\' 4"  (1.626 m)   Wt Readings from Last 3 Encounters:  11/04/20 66.1 kg  10/21/20 67.2 kg  10/13/20 71.7 kg    Labs: Lab Results  Component Value Date   NA 140 10/21/2020   K 5.8 (H) 10/21/2020   CL 104 10/21/2020   CO2 23 10/21/2020   GLUCOSE 93 10/21/2020   BUN 15 10/21/2020   CREATININE 1.01 (H) 10/21/2020   CALCIUM 9.4 10/21/2020   No results found for: INR No results found for: CHOL, HDL, LDLCALC, TRIG   GEN- The patient is well appearing, alert and  oriented x 3 today.   Head- normocephalic, atraumatic Eyes-  Sclera clear, conjunctiva pink Ears- hearing intact Oropharynx- clear Neck- supple, no JVP Lymph- no cervical lymphadenopathy Lungs- few crackles in the bases, normal work of breathing Heart- irregular rate and rhythm, no murmurs, rubs or gallops, PMI not laterally displaced GI- soft, NT, ND, + BS Extremities- no clubbing,  cyanosis, trace edema MS- no significant deformity or atrophy Skin- no rash or lesion Psych- euthymic mood, full affect Neuro- strength and sensation are intact  EKG- afib at 66 bpm, qrs int 84, qtc 394 ms   Echo-1. Left ventricular ejection fraction, by estimation, is 65 to 70%. The  left ventricle has normal function. The left ventricle has no regional  wall motion abnormalities. Left ventricular diastolic function could not  be evaluated.  2. Right ventricular systolic function is normal. The right ventricular  size is normal. There is mildly elevated pulmonary artery systolic  pressure. The estimated right ventricular systolic pressure is 75.6 mmHg.  3. Left atrial size was mildly dilated.  4. The mitral valve is normal in structure. Mild to moderate mitral valve  regurgitation. No evidence of mitral stenosis.  5. Tricuspid valve regurgitation is moderate.  6. The aortic valve is normal in structure. Aortic valve regurgitation is  trivial. Mild to moderate aortic valve sclerosis/calcification is present,  without any evidence of aortic stenosis.  7. The inferior vena cava is normal in size with greater than 50%  respiratory variability, suggesting right atrial pressure of 3 mmHg.   Chest CT-07/17/20-IMPRESSION: 1. No evidence for acute pulmonary embolus. 2. Multifocal bilateral lower lung zone predominant areas of consolidation and ground-glass attenuation compatible with inflammation/infection. Short-term follow-up imaging in 3 months is recommended following appropriate antibiotic  therapy and resolution of any symptoms. 3. Coronary artery calcifications noted. 4. Bilateral hilar and mediastinal adenopathy. This is a nonspecific finding in the setting of pneumonia and may be reactive in etiology. Attention on 3 month follow-up chest CT is recommended to ensure resolution. 5. Scattered nonspecific milli metric lung nodules are noted bilaterally which are likely postinflammatory/infectious in etiology. Attention on follow-up imaging is advised. 6. Nonobstructing left renal calculi. 7. Aortic atherosclerosis.  Assessment and Plan: 1. New onset  afib 07/31/20 Initial return to SR with CCB  Return  of afib 1/15 Successful  cardioversion 1/15 with ERAF With the addition of lasix, dig and rate control meds she is significantly improved  and feels back to her baseline  For now will continue Cardizem 240 mg daily/ metoprolol 12.5 mg bid/digoxin 0.125 mg qd  V/S are being watched by nurse at pt's facility and reviewed by son  Bmet today   2. CHA2DS2VASc score of 5 Continue eliuis 5 mg bid    F/u  with Dr. Johney Frame in one month  afib clinic as needed    Laurens. Khiyan Crace, [facility] [address] [place], [place] [address] [phone]

## 2020-11-05 DIAGNOSIS — L97329 Non-pressure chronic ulcer of left ankle with unspecified severity: Secondary | ICD-10-CM | POA: Diagnosis not present

## 2020-11-05 DIAGNOSIS — I700 Atherosclerosis of aorta: Secondary | ICD-10-CM | POA: Diagnosis not present

## 2020-11-05 DIAGNOSIS — I69320 Aphasia following cerebral infarction: Secondary | ICD-10-CM | POA: Diagnosis not present

## 2020-11-05 DIAGNOSIS — I4819 Other persistent atrial fibrillation: Secondary | ICD-10-CM | POA: Diagnosis not present

## 2020-11-05 DIAGNOSIS — I872 Venous insufficiency (chronic) (peripheral): Secondary | ICD-10-CM | POA: Diagnosis not present

## 2020-11-05 DIAGNOSIS — I10 Essential (primary) hypertension: Secondary | ICD-10-CM | POA: Diagnosis not present

## 2020-11-08 ENCOUNTER — Ambulatory Visit: Payer: Medicare Other | Admitting: Cardiology

## 2020-11-08 DIAGNOSIS — I4819 Other persistent atrial fibrillation: Secondary | ICD-10-CM | POA: Diagnosis not present

## 2020-11-08 DIAGNOSIS — I69320 Aphasia following cerebral infarction: Secondary | ICD-10-CM | POA: Diagnosis not present

## 2020-11-08 DIAGNOSIS — I700 Atherosclerosis of aorta: Secondary | ICD-10-CM | POA: Diagnosis not present

## 2020-11-08 DIAGNOSIS — I872 Venous insufficiency (chronic) (peripheral): Secondary | ICD-10-CM | POA: Diagnosis not present

## 2020-11-08 DIAGNOSIS — I10 Essential (primary) hypertension: Secondary | ICD-10-CM | POA: Diagnosis not present

## 2020-11-08 DIAGNOSIS — L97329 Non-pressure chronic ulcer of left ankle with unspecified severity: Secondary | ICD-10-CM | POA: Diagnosis not present

## 2020-11-10 DIAGNOSIS — L97329 Non-pressure chronic ulcer of left ankle with unspecified severity: Secondary | ICD-10-CM | POA: Diagnosis not present

## 2020-11-10 DIAGNOSIS — I700 Atherosclerosis of aorta: Secondary | ICD-10-CM | POA: Diagnosis not present

## 2020-11-10 DIAGNOSIS — I872 Venous insufficiency (chronic) (peripheral): Secondary | ICD-10-CM | POA: Diagnosis not present

## 2020-11-10 DIAGNOSIS — I10 Essential (primary) hypertension: Secondary | ICD-10-CM | POA: Diagnosis not present

## 2020-11-10 DIAGNOSIS — I69320 Aphasia following cerebral infarction: Secondary | ICD-10-CM | POA: Diagnosis not present

## 2020-11-10 DIAGNOSIS — I4819 Other persistent atrial fibrillation: Secondary | ICD-10-CM | POA: Diagnosis not present

## 2020-11-11 DIAGNOSIS — I4819 Other persistent atrial fibrillation: Secondary | ICD-10-CM | POA: Diagnosis not present

## 2020-11-11 DIAGNOSIS — I10 Essential (primary) hypertension: Secondary | ICD-10-CM | POA: Diagnosis not present

## 2020-11-11 DIAGNOSIS — L97329 Non-pressure chronic ulcer of left ankle with unspecified severity: Secondary | ICD-10-CM | POA: Diagnosis not present

## 2020-11-11 DIAGNOSIS — I872 Venous insufficiency (chronic) (peripheral): Secondary | ICD-10-CM | POA: Diagnosis not present

## 2020-11-11 DIAGNOSIS — I69320 Aphasia following cerebral infarction: Secondary | ICD-10-CM | POA: Diagnosis not present

## 2020-11-11 DIAGNOSIS — I700 Atherosclerosis of aorta: Secondary | ICD-10-CM | POA: Diagnosis not present

## 2020-11-16 DIAGNOSIS — I700 Atherosclerosis of aorta: Secondary | ICD-10-CM | POA: Diagnosis not present

## 2020-11-16 DIAGNOSIS — I69320 Aphasia following cerebral infarction: Secondary | ICD-10-CM | POA: Diagnosis not present

## 2020-11-16 DIAGNOSIS — I872 Venous insufficiency (chronic) (peripheral): Secondary | ICD-10-CM | POA: Diagnosis not present

## 2020-11-16 DIAGNOSIS — L97329 Non-pressure chronic ulcer of left ankle with unspecified severity: Secondary | ICD-10-CM | POA: Diagnosis not present

## 2020-11-16 DIAGNOSIS — I4819 Other persistent atrial fibrillation: Secondary | ICD-10-CM | POA: Diagnosis not present

## 2020-11-16 DIAGNOSIS — I10 Essential (primary) hypertension: Secondary | ICD-10-CM | POA: Diagnosis not present

## 2020-11-18 DIAGNOSIS — I700 Atherosclerosis of aorta: Secondary | ICD-10-CM | POA: Diagnosis not present

## 2020-11-18 DIAGNOSIS — I69320 Aphasia following cerebral infarction: Secondary | ICD-10-CM | POA: Diagnosis not present

## 2020-11-18 DIAGNOSIS — I4819 Other persistent atrial fibrillation: Secondary | ICD-10-CM | POA: Diagnosis not present

## 2020-11-18 DIAGNOSIS — I69351 Hemiplegia and hemiparesis following cerebral infarction affecting right dominant side: Secondary | ICD-10-CM | POA: Diagnosis not present

## 2020-11-18 DIAGNOSIS — I10 Essential (primary) hypertension: Secondary | ICD-10-CM | POA: Diagnosis not present

## 2020-11-18 DIAGNOSIS — I872 Venous insufficiency (chronic) (peripheral): Secondary | ICD-10-CM | POA: Diagnosis not present

## 2020-11-18 DIAGNOSIS — L97329 Non-pressure chronic ulcer of left ankle with unspecified severity: Secondary | ICD-10-CM | POA: Diagnosis not present

## 2020-11-18 DIAGNOSIS — M8580 Other specified disorders of bone density and structure, unspecified site: Secondary | ICD-10-CM | POA: Diagnosis not present

## 2020-11-18 DIAGNOSIS — Z87891 Personal history of nicotine dependence: Secondary | ICD-10-CM | POA: Diagnosis not present

## 2020-11-18 DIAGNOSIS — M1990 Unspecified osteoarthritis, unspecified site: Secondary | ICD-10-CM | POA: Diagnosis not present

## 2020-11-18 DIAGNOSIS — E039 Hypothyroidism, unspecified: Secondary | ICD-10-CM | POA: Diagnosis not present

## 2020-11-18 DIAGNOSIS — Z7901 Long term (current) use of anticoagulants: Secondary | ICD-10-CM | POA: Diagnosis not present

## 2020-11-23 DIAGNOSIS — L821 Other seborrheic keratosis: Secondary | ICD-10-CM | POA: Diagnosis not present

## 2020-11-23 DIAGNOSIS — L570 Actinic keratosis: Secondary | ICD-10-CM | POA: Diagnosis not present

## 2020-11-23 DIAGNOSIS — L814 Other melanin hyperpigmentation: Secondary | ICD-10-CM | POA: Diagnosis not present

## 2020-11-23 DIAGNOSIS — D692 Other nonthrombocytopenic purpura: Secondary | ICD-10-CM | POA: Diagnosis not present

## 2020-11-25 DIAGNOSIS — I872 Venous insufficiency (chronic) (peripheral): Secondary | ICD-10-CM | POA: Diagnosis not present

## 2020-11-25 DIAGNOSIS — I4819 Other persistent atrial fibrillation: Secondary | ICD-10-CM | POA: Diagnosis not present

## 2020-11-25 DIAGNOSIS — L97329 Non-pressure chronic ulcer of left ankle with unspecified severity: Secondary | ICD-10-CM | POA: Diagnosis not present

## 2020-11-25 DIAGNOSIS — I69320 Aphasia following cerebral infarction: Secondary | ICD-10-CM | POA: Diagnosis not present

## 2020-11-25 DIAGNOSIS — I10 Essential (primary) hypertension: Secondary | ICD-10-CM | POA: Diagnosis not present

## 2020-11-25 DIAGNOSIS — I700 Atherosclerosis of aorta: Secondary | ICD-10-CM | POA: Diagnosis not present

## 2020-12-08 NOTE — Progress Notes (Signed)
Cardiology Office Note:    Date:  12/09/2020   ID:  Peggy Williams, DOB [birth year], MRN [id number]  PCP:  Peggy Williams, Peggy Williams  Cardiologist:  No primary care provider on file.  Advanced Practice Provider:  No care team member to display Electrophysiologist:  None    Referring MD: Peggy Manes, MD    History of Present Illness:    Peggy Williams is a 87 y.o. female with a hx of  Afib (dx 07/2020 in the setting of shingles), remote history of brain aneurysm with post-procedure CVA bleed s/p coiling and hypothyroidism who presents to clinic for follow-up.  Has been followed by Peggy Merle, NP and Peggy Palau, NP. Saw Peggy Williams on 09/29/20 after recent ER visit for Afib with RVR. She underwent DCCV at that time but was noted to be back in Afib with RVR with HR 127bpm in clinic. She was symptomatic with fatigue. She was accompanied by her son who preferred conservative management. She has been compliant with her eliquis.During that visit, she was continued on cardizem 240mg  and metop 12.5mg  XL added. Did not want to pursue amiodarone due to ground glass opacities on CT chest and hypothyroidism.  During our last visit on 10/08/20, we had increased her metop to 25mg  BID and digoxin due to persistent episodes of RVR. She also was having significant LE edema and lasix 20mg  PO daily was started. She last saw Peggy Williams on 11/04/20 where she was doing much better with HR better controlled and LE improved.   The patient states that she feels well. Energy is significantly improved. She remains very active and heart rate is well controlled. No LE edema. No lightheadedness, dizziness, palpitations, chest pain or shortness of breath.   Past Medical History:  Diagnosis Date  . Arthritis   . Cataract   . Stroke [facility])    complication of aneurysm repair  . Thyroid disease     Past Surgical History:  Procedure Laterality Date  . ABDOMINAL HYSTERECTOMY     . BRAIN SURGERY    . EYE SURGERY    . JOINT REPLACEMENT      Current Medications: Current Meds  Medication Sig  . apixaban (ELIQUIS) 5 MG TABS tablet Take 1 tablet (5 mg total) by mouth 2 (two) times daily.  . digoxin (LANOXIN) 0.125 MG tablet Take 1 tablet (0.125 mg total) by mouth daily.  Marland Kitchen diltiazem (CARDIZEM CD) 240 MG 24 hr capsule Take 1 capsule (240 mg total) by mouth daily.  Marland Kitchen levothyroxine (SYNTHROID, LEVOTHROID) 50 MCG tablet Take 50 mcg by mouth daily before breakfast.  . Multiple Vitamins-Minerals (OCUVITE ADULT 50+) CAPS Take 1 capsule by mouth daily in the afternoon.  . Vitamin D-Vitamin K (VITAMIN K2-VITAMIN D3) 45-2000 MCG-UNIT CAPS Take 1 capsule by mouth daily in the afternoon.  . [DISCONTINUED] furosemide (LASIX) 20 MG tablet Take 1 tablet (20 mg total) by mouth daily.  . [DISCONTINUED] metoprolol succinate (TOPROL-XL) 25 MG 24 hr tablet Take 0.5 tablets (12.5 mg total) by mouth in the morning and at bedtime.  . [DISCONTINUED] potassium chloride (KLOR-CON) 10 MEQ tablet Take 1 tablet (10 mEq total) by mouth daily.     Allergies:   Patient has no known allergies.   Social History   Socioeconomic History  . Marital status: Widowed    Spouse name: Not on file  . Number of children: Not on file  . Years of education: Not on file  . Highest education  level: Not on file  Occupational History  . Occupation: retired  Tobacco Use  . Smoking status: Former Smoker    Packs/day: 1.00    Years: 45.00    Pack years: 45.00    Types: Cigarettes    Quit date: 2019    Years since quitting: 3.2  . Smokeless tobacco: Never Used  Substance and Sexual Activity  . Alcohol use: No  . Drug use: No  . Sexual activity: Never  Other Topics Concern  . Not on file  Social History Narrative  . Not on file   Social Determinants of Health   Financial Resource Strain: Not on file  Food Insecurity: Not on file  Transportation Needs: Not on file  Physical Activity: Not on file   Stress: Not on file  Social Connections: Not on file     Family History: The patient's family history is not on file.  ROS:   Please see the history of present illness.    Review of Systems  Constitutional: Negative for chills and fever.  HENT: Positive for hearing loss.   Eyes: Negative for blurred vision and redness.  Respiratory: Negative for shortness of breath.   Cardiovascular: Negative for chest pain, palpitations, orthopnea, claudication, leg swelling and PND.  Gastrointestinal: Negative for blood in stool, melena, nausea and vomiting.  Genitourinary: Negative for hematuria.  Musculoskeletal: Negative for falls.  Neurological: Negative for dizziness and loss of consciousness.  Endo/Heme/Allergies: Negative for polydipsia.  Psychiatric/Behavioral: Negative for substance abuse.    EKGs/Labs/Other Studies Reviewed:    The following studies were reviewed today: Echo 11.07/21: 1. Left ventricular ejection fraction, by estimation, is 65 to 70%. The  left ventricle has normal function. The left ventricle has no regional  wall motion abnormalities. Left ventricular diastolic function could not  be evaluated.  2. Right ventricular systolic function is normal. The right ventricular  size is normal. There is mildly elevated pulmonary artery systolic  pressure. The estimated right ventricular systolic pressure is 65.4 mmHg.  3. Left atrial size was mildly dilated.  4. The mitral valve is normal in structure. Mild to moderate mitral valve  regurgitation. No evidence of mitral stenosis.  5. Tricuspid valve regurgitation is moderate.  6. The aortic valve is normal in structure. Aortic valve regurgitation is  trivial. Mild to moderate aortic valve sclerosis/calcification is present,  without any evidence of aortic stenosis.  7. The inferior vena cava is normal in size with greater than 50%  respiratory variability, suggesting right atrial pressure of 3 mmHg.   Chest  CT-07/17/20-IMPRESSION: 1. No evidence for acute pulmonary embolus. 2. Multifocal bilateral lower lung zone predominant areas of consolidation and ground-glass attenuation compatible with inflammation/infection. Short-term follow-up imaging in 3 months is recommended following appropriate antibiotic therapy and resolution of any symptoms. 3. Coronary artery calcifications noted. 4. Bilateral hilar and mediastinal adenopathy. This is a nonspecific finding in the setting of pneumonia and may be reactive in etiology. Attention on 3 month follow-up chest CT is recommended to ensure resolution. 5. Scattered nonspecific milli metric lung nodules are noted bilaterally which are likely postinflammatory/infectious in etiology. Attention on follow-up imaging is advised. 6. Nonobstructing left renal calculi. 7. Aortic atherosclerosis.  Recent Labs: 07/17/2020: TSH 1.595 07/18/2020: B Natriuretic Peptide 516.9 09/25/2020: ALT 68; Hemoglobin 14.9; Platelets 257 11/04/2020: BUN 16; Creatinine, Ser 1.13; Potassium 4.7; Sodium 143  Recent Lipid Panel No results found for: CHOL, TRIG, HDL, CHOLHDL, VLDL, LDLCALC, LDLDIRECT   Risk Assessment/Calculations:    CHA2DS2-VASc Score =  5  This indicates a 7.2% annual risk of stroke. The patient's score is based upon: CHF History: No HTN History: Yes Diabetes History: No Stroke History: No Vascular Disease History: Yes Age Score: 2 Gender Score: 1    Physical Exam:    VS:  BP 98/64   Pulse 63   Ht 5\' 4"  (1.626 m)   Wt 144 lb 12.8 oz (65.7 kg)   SpO2 94%   BMI 24.85 kg/m     Wt Readings from Last 3 Encounters:  12/09/20 144 lb 12.8 oz (65.7 kg)  11/04/20 145 lb 12.8 oz (66.1 kg)  10/21/20 148 lb 3.2 oz (67.2 kg)     GEN:  Well nourished, well developed in no acute distress HEENT: Normal NECK: No JVD; No carotid bruits CARDIAC: Irregular, 2/6 systolic murmur. No rubs, gallops RESPIRATORY:  Clear to auscultation without rales, wheezing or  rhonchi  ABDOMEN: Soft, non-tender, non-distended MUSCULOSKELETAL:  No edema; No deformity  SKIN: Warm and dry NEUROLOGIC:  Alert and oriented x 3 PSYCHIATRIC:  Normal affect   ASSESSMENT:    1. Persistent atrial fibrillation ([place])   2. Secondary hypercoagulable state ([place])   3. Medication management   4. Chronic obstructive pulmonary disease, unspecified COPD type ([place])   5. Chronic diastolic heart failure ([place])   6. Moderate tricuspid insufficiency    PLAN:    In order of problems listed above:  #Persistent Afib: CHADs-vasc 5. Followed by Afib clinic. Currently on dilt 240mg  daily and metop 25mg  XL BID. Was successfully DCCV on 09/25/20 but back out of rhythm. Want to pursue conservation approach. Not ideal candidate for amiodarone due to concern for ILD on CT chest and underlying hypothyroidism. Blood pressure soft and actively wheezing on exam and therefore do not want to uptitrate BB or dilt at this time. Will do trial of digoxin and have her follow-up in Afib clinic for further monitoring -Continue digoxin 0.125mg   -Check dig level with PCP at next visit in April -Continue dilt 240mg  daily -Continue metop 25mg  XL BID -Continue apixaban 5mg  BID -Follow-up with Afib clinic as scheduled  #Chronic diastolic heart failure: Significantly improved and euvolemic. -Manage Afib as above -Continue lasix 20mg  daily -BMET and digoxin level with PCP in a couple weeks -Low Na diet -Daily weights  #[place] TR: -Serial TTE in 2024  #ILD: #Wheezing: #History of Heavy Tobacco Use: Patient wheezy on examination in setting of volume overload as well as heavy history of tobacco use. CT also with ILD. Has not followed with pulm. -Declined pulmonary referral   Medication Adjustments/Labs and Tests Ordered: Current medicines are reviewed at length with the patient today.  Concerns regarding medicines are outlined above.  No orders of the defined types were placed in this  encounter.  Meds ordered this encounter  Medications  . furosemide (LASIX) 20 MG tablet    Sig: Take 1 tablet (20 mg total) by mouth daily.    Dispense:  90 tablet    Refill:  3  . metoprolol succinate (TOPROL-XL) 25 MG 24 hr tablet    Sig: Take 0.5 tablets (12.5 mg total) by mouth in the morning and at bedtime.    Dispense:  90 tablet    Refill:  3  . potassium chloride (KLOR-CON) 10 MEQ tablet    Sig: Take 1 tablet (10 mEq total) by mouth daily.    Dispense:  90 tablet    Refill:  3    Patient Instructions  Medication Instructions:   Your  physician recommends that you continue on your current medications as directed. Please refer to the Current Medication list given to you today.  *If you need a refill on your cardiac medications before your next appointment, please call your pharmacy*    Follow-Up: At [facility], you and your health needs are our priority.  As part of our continuing mission to provide you with exceptional heart care, we have created designated Provider Care Teams.  These Care Teams include your primary Cardiologist (physician) and Advanced Practice Providers (APPs -  Physician Assistants and Nurse Practitioners) who all work together to provide you with the care you need, when you need it.  We recommend signing up for the patient portal called "MyChart".  Sign up information is provided on this After Visit Summary.  MyChart is used to connect with patients for Virtual Visits (Telemedicine).  Patients are able to view lab/test results, encounter notes, upcoming appointments, etc.  Non-urgent messages can be sent to your provider as well.   To learn more about what you can do with MyChart, go to [email or web address].    Your next appointment:   6 month(s)  The format for your next appointment:   In Person  Provider:   You will see one of the following Advanced Practice Providers on your designated Care Team:    Richardson Dopp, PA-C  Robbie Lis,  PA-C         Signed, Freada Bergeron, MD  12/09/2020 12:21 PM    [facility]

## 2020-12-09 ENCOUNTER — Other Ambulatory Visit: Payer: Self-pay

## 2020-12-09 ENCOUNTER — Encounter: Payer: Self-pay | Admitting: Cardiology

## 2020-12-09 ENCOUNTER — Ambulatory Visit (INDEPENDENT_AMBULATORY_CARE_PROVIDER_SITE_OTHER): Payer: Medicare Other | Admitting: Cardiology

## 2020-12-09 VITALS — BP 98/64 | HR 63 | Ht 64.0 in | Wt 144.8 lb

## 2020-12-09 DIAGNOSIS — I071 Rheumatic tricuspid insufficiency: Secondary | ICD-10-CM

## 2020-12-09 DIAGNOSIS — I5032 Chronic diastolic (congestive) heart failure: Secondary | ICD-10-CM

## 2020-12-09 DIAGNOSIS — Z79899 Other long term (current) drug therapy: Secondary | ICD-10-CM | POA: Diagnosis not present

## 2020-12-09 DIAGNOSIS — I4819 Other persistent atrial fibrillation: Secondary | ICD-10-CM | POA: Diagnosis not present

## 2020-12-09 DIAGNOSIS — D6869 Other thrombophilia: Secondary | ICD-10-CM | POA: Diagnosis not present

## 2020-12-09 DIAGNOSIS — J449 Chronic obstructive pulmonary disease, unspecified: Secondary | ICD-10-CM | POA: Diagnosis not present

## 2020-12-09 MED ORDER — FUROSEMIDE 20 MG PO TABS: 20.0000 mg | ORAL_TABLET | Freq: Every day | ORAL | 3 refills | Status: AC

## 2020-12-09 MED ORDER — METOPROLOL SUCCINATE ER 25 MG PO TB24: 12.5000 mg | ORAL_TABLET | Freq: Two times a day (BID) | ORAL | 3 refills | Status: DC

## 2020-12-09 MED ORDER — POTASSIUM CHLORIDE ER 10 MEQ PO TBCR: 10.0000 meq | EXTENDED_RELEASE_TABLET | Freq: Every day | ORAL | 3 refills | Status: DC

## 2020-12-09 NOTE — Patient Instructions (Signed)
Medication Instructions:   Your physician recommends that you continue on your current medications as directed. Please refer to the Current Medication list given to you today.  *If you need a refill on your cardiac medications before your next appointment, please call your pharmacy*    Follow-Up: At [facility], you and your health needs are our priority.  As part of our continuing mission to provide you with exceptional heart care, we have created designated Provider Care Teams.  These Care Teams include your primary Cardiologist (physician) and Advanced Practice Providers (APPs -  Physician Assistants and Nurse Practitioners) who all work together to provide you with the care you need, when you need it.  We recommend signing up for the patient portal called "MyChart".  Sign up information is provided on this After Visit Summary.  MyChart is used to connect with patients for Virtual Visits (Telemedicine).  Patients are able to view lab/test results, encounter notes, upcoming appointments, etc.  Non-urgent messages can be sent to your provider as well.   To learn more about what you can do with MyChart, go to [email or web address].    Your next appointment:   6 month(s)  The format for your next appointment:   In Person  Provider:   You will see one of the following Advanced Practice Providers on your designated Care Team:    Richardson Dopp, PA-C  Vin Wallace, [place]

## 2020-12-13 ENCOUNTER — Emergency Department (HOSPITAL_COMMUNITY)
Admission: EM | Admit: 2020-12-13 | Discharge: 2020-12-13 | Disposition: A | Discharge status: Home / Self Care | Payer: Medicare Other | Attending: Emergency Medicine | Admitting: Emergency Medicine

## 2020-12-13 ENCOUNTER — Other Ambulatory Visit: Payer: Self-pay

## 2020-12-13 ENCOUNTER — Encounter (HOSPITAL_COMMUNITY): Payer: Self-pay

## 2020-12-13 ENCOUNTER — Emergency Department (HOSPITAL_COMMUNITY): Payer: Medicare Other

## 2020-12-13 DIAGNOSIS — R918 Other nonspecific abnormal finding of lung field: Secondary | ICD-10-CM

## 2020-12-13 DIAGNOSIS — K5730 Diverticulosis of large intestine without perforation or abscess without bleeding: Secondary | ICD-10-CM | POA: Diagnosis not present

## 2020-12-13 DIAGNOSIS — N200 Calculus of kidney: Secondary | ICD-10-CM | POA: Diagnosis not present

## 2020-12-13 DIAGNOSIS — I4891 Unspecified atrial fibrillation: Secondary | ICD-10-CM | POA: Diagnosis not present

## 2020-12-13 DIAGNOSIS — Z7901 Long term (current) use of anticoagulants: Secondary | ICD-10-CM | POA: Insufficient documentation

## 2020-12-13 DIAGNOSIS — R109 Unspecified abdominal pain: Secondary | ICD-10-CM | POA: Diagnosis present

## 2020-12-13 DIAGNOSIS — K8020 Calculus of gallbladder without cholecystitis without obstruction: Secondary | ICD-10-CM | POA: Diagnosis not present

## 2020-12-13 DIAGNOSIS — Z87891 Personal history of nicotine dependence: Secondary | ICD-10-CM | POA: Diagnosis not present

## 2020-12-13 DIAGNOSIS — N132 Hydronephrosis with renal and ureteral calculous obstruction: Secondary | ICD-10-CM | POA: Diagnosis not present

## 2020-12-13 DIAGNOSIS — D1430 Benign neoplasm of unspecified bronchus and lung: Secondary | ICD-10-CM | POA: Insufficient documentation

## 2020-12-13 DIAGNOSIS — K8080 Other cholelithiasis without obstruction: Secondary | ICD-10-CM | POA: Diagnosis not present

## 2020-12-13 LAB — URINALYSIS, ROUTINE W REFLEX MICROSCOPIC
Bilirubin Urine: NEGATIVE
Glucose, UA: NEGATIVE mg/dL
Ketones, ur: 20 mg/dL — AB
Nitrite: NEGATIVE
Protein, ur: 100 mg/dL — AB
RBC / HPF: >50 RBC/hpf — ABNORMAL HIGH (ref 0–5)
Specific Gravity, Urine: 1.012 (ref 1.005–1.030)
WBC, UA: >50 WBC/hpf — ABNORMAL HIGH (ref 0–5)
pH: 5 (ref 5.0–8.0)

## 2020-12-13 LAB — COMPREHENSIVE METABOLIC PANEL
ALT: 17 U/L (ref 0–44)
AST: 23 U/L (ref 15–41)
Albumin: 4.5 g/dL (ref 3.5–5.0)
Alkaline Phosphatase: 83 U/L (ref 38–126)
Anion gap: 11 (ref 5–15)
BUN: 21 mg/dL (ref 8–23)
CO2: 25 mmol/L (ref 22–32)
Calcium: 9.5 mg/dL (ref 8.9–10.3)
Chloride: 101 mmol/L (ref 98–111)
Creatinine, Ser: 1.28 mg/dL — ABNORMAL HIGH (ref 0.44–1.00)
GFR, Estimated: 40 mL/min — ABNORMAL LOW (ref >60)
Glucose, Bld: 118 mg/dL — ABNORMAL HIGH (ref 70–99)
Potassium: 3.7 mmol/L (ref 3.5–5.1)
Sodium: 137 mmol/L (ref 135–145)
Total Bilirubin: 1.4 mg/dL — ABNORMAL HIGH (ref 0.3–1.2)
Total Protein: 7.4 g/dL (ref 6.5–8.1)

## 2020-12-13 LAB — CBC WITH DIFFERENTIAL/PLATELET
Abs Immature Granulocytes: 0.06 10*3/uL (ref 0.00–0.07)
Basophils Absolute: 0.1 10*3/uL (ref 0.0–0.1)
Basophils Relative: 1 %
Eosinophils Absolute: 0.1 10*3/uL (ref 0.0–0.5)
Eosinophils Relative: 1 %
HCT: 52.8 % — ABNORMAL HIGH (ref 36.0–46.0)
Hemoglobin: 17.6 g/dL — ABNORMAL HIGH (ref 12.0–15.0)
Immature Granulocytes: 1 %
Lymphocytes Relative: 11 %
Lymphs Abs: 1.4 10*3/uL (ref 0.7–4.0)
MCH: 30.8 pg (ref 26.0–34.0)
MCHC: 33.3 g/dL (ref 30.0–36.0)
MCV: 92.3 fL (ref 80.0–100.0)
Monocytes Absolute: 1 10*3/uL (ref 0.1–1.0)
Monocytes Relative: 7 %
Neutro Abs: 10.3 10*3/uL — ABNORMAL HIGH (ref 1.7–7.7)
Neutrophils Relative %: 79 %
Platelets: 264 10*3/uL (ref 150–400)
RBC: 5.72 MIL/uL — ABNORMAL HIGH (ref 3.87–5.11)
RDW: 14.2 % (ref 11.5–15.5)
WBC: 12.9 10*3/uL — ABNORMAL HIGH (ref 4.0–10.5)
nRBC: 0 % (ref 0.0–0.2)

## 2020-12-13 LAB — LACTIC ACID, PLASMA: Lactic Acid, Venous: 1.8 mmol/L (ref 0.5–1.9)

## 2020-12-13 MED ORDER — OXYCODONE HCL 5 MG PO TABS: 5.0000 mg | ORAL_TABLET | Freq: Four times a day (QID) | ORAL | 0 refills | Status: AC | PRN

## 2020-12-13 MED ORDER — OXYCODONE-ACETAMINOPHEN 5-325 MG PO TABS
1.0000 | ORAL_TABLET | Freq: Once | ORAL | Status: AC
  Administered 2020-12-13: 1 via ORAL
  Filled 2020-12-13: qty 1

## 2020-12-13 MED ORDER — ONDANSETRON HCL 4 MG PO TABS: 4.0000 mg | ORAL_TABLET | Freq: Four times a day (QID) | ORAL | 0 refills | Status: DC

## 2020-12-13 MED ORDER — ONDANSETRON HCL 4 MG PO TABS
4.0000 mg | ORAL_TABLET | Freq: Four times a day (QID) | ORAL | 0 refills | Status: DC
Start: 2020-12-13 — End: 2024-01-28

## 2020-12-13 MED ORDER — FENTANYL CITRATE (PF) 100 MCG/2ML IJ SOLN
50.0000 ug | Freq: Once | INTRAMUSCULAR | Status: AC
  Administered 2020-12-13: 50 ug via INTRAVENOUS
  Filled 2020-12-13: qty 2

## 2020-12-13 MED ORDER — SODIUM CHLORIDE 0.9 % IV BOLUS
1000.0000 mL | Freq: Once | INTRAVENOUS | Status: AC
  Administered 2020-12-13: 1000 mL via INTRAVENOUS

## 2020-12-13 MED ORDER — ONDANSETRON 4 MG PO TBDP
4.0000 mg | ORAL_TABLET | Freq: Once | ORAL | Status: AC
  Administered 2020-12-13: 4 mg via ORAL
  Filled 2020-12-13: qty 1

## 2020-12-13 MED ORDER — ONDANSETRON HCL 4 MG/2ML IJ SOLN
4.0000 mg | Freq: Once | INTRAMUSCULAR | Status: AC
  Administered 2020-12-13: 4 mg via INTRAVENOUS
  Filled 2020-12-13: qty 2

## 2020-12-13 MED ORDER — OXYCODONE HCL 5 MG PO TABS: 5.0000 mg | ORAL_TABLET | Freq: Four times a day (QID) | ORAL | 0 refills | Status: DC | PRN

## 2020-12-13 MED ORDER — SODIUM CHLORIDE 0.9 % IV BOLUS
250.0000 mL | Freq: Once | INTRAVENOUS | Status: AC
  Administered 2020-12-13: 250 mL via INTRAVENOUS

## 2020-12-13 NOTE — Discharge Instructions (Addendum)
Start taking stool softeners when you start taking oxycodone.  There is a density in the base of the right lung that was present on a CT scan from 11/21 but that will need to be followed by pulmonology.

## 2020-12-13 NOTE — ED Provider Notes (Signed)
[place] DEPT Provider Note   CSN: [id number] Arrival date & time: 12/13/20  1727     History Chief Complaint  Patient presents with  . Flank Pain    Peggy Williams is a 87 y.o. female.  Peggy Williams has recently been started on Eliquis and Lasix for atrial fibrillation.  She has also had hematuria in addition to her flank pain.  The history is provided by the patient and a relative (niece). History limited by: mild expressive aphasia from prior stroke.  Flank Pain This is a new problem. The current episode started more than 2 days ago (3 days ago). Episode frequency: had pain 3 days ago, improved, and then worsened today. The problem has been gradually worsening. Associated symptoms include abdominal pain (left-sided). Pertinent negatives include no chest pain, no headaches and no shortness of breath. Nothing aggravates the symptoms. Nothing relieves the symptoms. She has tried nothing for the symptoms. The treatment provided no relief.       Past Medical History:  Diagnosis Date  . Arthritis   . Cataract   . Stroke [facility])    complication of aneurysm repair  . Thyroid disease     Patient Active Problem List   Diagnosis Date Noted  . New onset atrial flutter ([place]) 07/17/2020  . History of CVA (cerebrovascular accident) 07/17/2020  . Shingles 07/17/2020  . DNR (do not resuscitate) 07/17/2020    Past Surgical History:  Procedure Laterality Date  . ABDOMINAL HYSTERECTOMY    . BRAIN SURGERY    . EYE SURGERY    . JOINT REPLACEMENT       OB History   No obstetric history on file.     History reviewed. No pertinent family history.  Social History   Tobacco Use  . Smoking status: Former Smoker    Packs/day: 1.00    Years: 45.00    Pack years: 45.00    Types: Cigarettes    Quit date: 2019    Years since quitting: 3.2  . Smokeless tobacco: Never Used  Vaping Use  . Vaping Use: Never used  Substance Use Topics  . Alcohol use:  No  . Drug use: No    Home Medications Prior to Admission medications   Medication Sig Start Date End Date Taking? Authorizing Provider  apixaban (ELIQUIS) 5 MG TABS tablet Take 1 tablet (5 mg total) by mouth 2 (two) times daily. 09/15/20   Burtis Junes, NP  digoxin (LANOXIN) 0.125 MG tablet Take 1 tablet (0.125 mg total) by mouth daily. 10/11/20   Freada Bergeron, MD  diltiazem (CARDIZEM CD) 240 MG 24 hr capsule Take 1 capsule (240 mg total) by mouth daily. 09/15/20   Burtis Junes, NP  furosemide (LASIX) 20 MG tablet Take 1 tablet (20 mg total) by mouth daily. 12/09/20   Freada Bergeron, MD  levothyroxine (SYNTHROID, LEVOTHROID) 50 MCG tablet Take 50 mcg by mouth daily before breakfast.    [provider]  metoprolol succinate (TOPROL-XL) 25 MG 24 hr tablet Take 0.5 tablets (12.5 mg total) by mouth in the morning and at bedtime. 12/09/20   Freada Bergeron, MD  Multiple Vitamins-Minerals [facility] ADULT 50+) CAPS Take 1 capsule by mouth daily in the afternoon.    [provider]  potassium chloride (KLOR-CON) 10 MEQ tablet Take 1 tablet (10 mEq total) by mouth daily. 12/09/20   Freada Bergeron, MD  valACYclovir (VALTREX) 1000 MG tablet TAKE 1 TABLET (1,000 MG  TOTAL) BY MOUTH THREE TIMES DAILY FOR 5 DAYS. 07/19/20 07/19/21  Little Ishikawa, MD  Vitamin D-Vitamin K (VITAMIN K2-VITAMIN D3) 45-2000 MCG-UNIT CAPS Take 1 capsule by mouth daily in the afternoon.    [provider]    Allergies    Patient has no known allergies.  Review of Systems   Review of Systems  Constitutional: Negative for chills and fever.  HENT: Negative for ear pain and sore throat.   Eyes: Negative for pain and visual disturbance.  Respiratory: Negative for cough and shortness of breath.   Cardiovascular: Negative for chest pain and palpitations.  Gastrointestinal: Positive for abdominal pain (left-sided). Negative for vomiting.  Genitourinary: Positive for flank pain  and hematuria. Negative for dysuria.  Musculoskeletal: Negative for arthralgias and back pain.  Skin: Negative for color change and rash.  Neurological: Negative for seizures, syncope and headaches.  All other systems reviewed and are negative.   Physical Exam Updated Vital Signs BP (!) 162/131 (BP Location: Right Arm)   Pulse (!) 112   Temp 97.8 F (36.6 C) (Oral)   Resp 18   Ht 5\' 4"  (1.626 m)   Wt 65.8 kg   SpO2 99%   BMI 24.89 kg/m   Physical Exam Vitals and nursing note reviewed.  HENT:     Head: Normocephalic and atraumatic.  Eyes:     General: No scleral icterus. Pulmonary:     Effort: Pulmonary effort is normal. No respiratory distress.  Abdominal:     Palpations: Abdomen is soft.     Tenderness: There is no abdominal tenderness. There is left CVA tenderness.  Musculoskeletal:     Cervical back: Normal range of motion.  Skin:    General: Skin is warm and dry.  Neurological:     General: No focal deficit present.     Mental Status: She is alert.  Psychiatric:        Mood and Affect: Mood normal.     ED Results / Procedures / Treatments   Labs (all labs ordered are listed, but only abnormal results are displayed) Labs Reviewed  URINALYSIS, ROUTINE W REFLEX MICROSCOPIC - Abnormal; Notable for the following components:      Result Value   APPearance HAZY (*)    Hgb urine dipstick LARGE (*)    Ketones, ur 20 (*)    Protein, ur 100 (*)    Leukocytes,Ua SMALL (*)    RBC / HPF >50 (*)    WBC, UA >50 (*)    Bacteria, UA RARE (*)    All other components within normal limits  CBC WITH DIFFERENTIAL/PLATELET - Abnormal; Notable for the following components:   WBC 12.9 (*)    RBC 5.72 (*)    Hemoglobin 17.6 (*)    HCT 52.8 (*)    Neutro Abs 10.3 (*)    All other components within normal limits  COMPREHENSIVE METABOLIC PANEL - Abnormal; Notable for the following components:   Glucose, Bld 118 (*)    Creatinine, Ser 1.28 (*)    Total Bilirubin 1.4 (*)     GFR, Estimated 40 (*)    All other components within normal limits  LACTIC ACID, PLASMA  LACTIC ACID, PLASMA    EKG None  Radiology CT Renal Stone Study  Result Date: 12/13/2020 CLINICAL DATA:  Hematuria.  Left flank pain. EXAM: CT ABDOMEN AND PELVIS WITHOUT CONTRAST TECHNIQUE: Multidetector CT imaging of the abdomen and pelvis was performed following the standard protocol without IV contrast. COMPARISON:  None. FINDINGS: Lower chest: There is extensive bronchial wall thickening and mucus plugging at the lung bases bilaterally. There is a masslike area of consolidation involving the right lower lobe measuring approximately 2.4 x 2.5 cm (axial series 6, image 18). This is essentially unchanged since prior study in November 2021 the heart is enlarged. Hepatobiliary: The liver is normal. Cholelithiasis without acute inflammation.There is no biliary ductal dilation. Pancreas: Normal contours without ductal dilatation. No peripancreatic fluid collection. Spleen: Unremarkable. Adrenals/Urinary Tract: --Adrenal glands: Unremarkable. --Right kidney/ureter: There are nonobstructing stones in lower pole of the right kidney. --Left kidney/ureter: There is fat stranding perinephric free fluid about the left kidney. There are multiple punctate nonobstructing stones involving the left kidney. There appears to be a partially obstructing 4 mm stone in the distal left ureter (axial series 2, image 56). There is minimal left-sided hydronephrosis. --Urinary bladder: There is a posterior bladder diverticulum on the left. Stomach/Bowel: --Stomach/Duodenum: No hiatal hernia or other gastric abnormality. Normal duodenal course and caliber. --Small bowel: Unremarkable. --Colon: Rectosigmoid diverticulosis without acute inflammation. --Appendix: Normal. Vascular/Lymphatic: Atherosclerotic calcification is present within the non-aneurysmal abdominal aorta, without hemodynamically significant stenosis. --No retroperitoneal  lymphadenopathy. --No mesenteric lymphadenopathy. --No pelvic or inguinal lymphadenopathy. Reproductive: Status post hysterectomy. No adnexal mass. Other: No ascites or free air. The abdominal wall is normal. Musculoskeletal. No acute displaced fractures. IMPRESSION: 1. There appears to be a partially obstructing 4 mm stone in the distal left ureter with minimal left-sided hydronephrosis. 2. Bilateral nephrolithiasis. 3. Cholelithiasis without acute inflammation. 4. Rectosigmoid diverticulosis without acute inflammation. 5. Masslike area of consolidation at the right lung base. This was present on the patient's July 17, 2020 CT of the chest. This is of unknown clinical significance and may represent an area of consolidation, rounded atelectasis, or soft tissue mass. Pulmonary medicine follow-up is recommended. Aortic Atherosclerosis (ICD10-I70.0). Electronically Signed   By: Constance Holster M.D.   On: 12/13/2020 20:29    Procedures Procedures   Medications Ordered in ED Medications  sodium chloride 0.9 % bolus 1,000 mL (0 mLs Intravenous Stopped 12/13/20 2055)  oxyCODONE-acetaminophen (PERCOCET/ROXICET) 5-325 MG per tablet 1 tablet (1 tablet Oral Given 12/13/20 1847)  ondansetron (ZOFRAN-ODT) disintegrating tablet 4 mg (4 mg Oral Given 12/13/20 1924)  sodium chloride 0.9 % bolus 250 mL (250 mLs Intravenous New Bag/Given 12/13/20 2055)  fentaNYL (SUBLIMAZE) injection 50 mcg (50 mcg Intravenous Given 12/13/20 2054)  ondansetron (ZOFRAN) injection 4 mg (4 mg Intravenous Given 12/13/20 2055)    ED Course  I have reviewed the triage vital signs and the nursing notes.  Pertinent labs & imaging results that were available during my care of the patient were reviewed by me and considered in my medical decision making (see chart for details).    MDM Rules/Calculators/A&P                          Peggy Williams presented with left flank pain and hematuria.  She was found to have a kidney stone.  Urinalysis  was not consistent with an overt infection, and culture will be sent.  The patient was tachycardic and was given pain medication and fluids.  She was very adamant that she did not want to go home.  A family member will be able to stay with her overnight.  We talked at length about side effects and appropriate use of pain medication.  I recommended close urology follow-up, and she was given careful return precautions. Final  Clinical Impression(s) / ED Diagnoses Final diagnoses:  Nephrolithiasis  Lung mass    Rx / DC Orders ED Discharge Orders         Ordered    oxyCODONE (ROXICODONE) 5 MG immediate release tablet  Every 6 hours PRN        12/13/20 2107           Arnaldo Natal, MD 12/13/20 2114

## 2020-12-13 NOTE — ED Triage Notes (Signed)
Patient states she has had intermittent left flank pain and hematuria x 2 days. Patient states prior to the hematuria she was having urinary frequency, but none today.  Patient is a resident of [facility].

## 2020-12-14 DIAGNOSIS — N201 Calculus of ureter: Secondary | ICD-10-CM | POA: Diagnosis not present

## 2020-12-14 DIAGNOSIS — N23 Unspecified renal colic: Secondary | ICD-10-CM | POA: Diagnosis not present

## 2020-12-15 LAB — URINE CULTURE

## 2020-12-22 DIAGNOSIS — N201 Calculus of ureter: Secondary | ICD-10-CM | POA: Diagnosis not present

## 2020-12-23 DIAGNOSIS — Z23 Encounter for immunization: Secondary | ICD-10-CM | POA: Diagnosis not present

## 2020-12-31 DIAGNOSIS — D6869 Other thrombophilia: Secondary | ICD-10-CM | POA: Diagnosis not present

## 2020-12-31 DIAGNOSIS — I700 Atherosclerosis of aorta: Secondary | ICD-10-CM | POA: Diagnosis not present

## 2020-12-31 DIAGNOSIS — I10 Essential (primary) hypertension: Secondary | ICD-10-CM | POA: Diagnosis not present

## 2020-12-31 DIAGNOSIS — I483 Typical atrial flutter: Secondary | ICD-10-CM | POA: Diagnosis not present

## 2021-01-03 DIAGNOSIS — H353133 Nonexudative age-related macular degeneration, bilateral, advanced atrophic without subfoveal involvement: Secondary | ICD-10-CM | POA: Diagnosis not present

## 2021-01-14 ENCOUNTER — Emergency Department (HOSPITAL_COMMUNITY): Payer: Medicare Other

## 2021-01-14 ENCOUNTER — Emergency Department (HOSPITAL_COMMUNITY)
Admission: EM | Admit: 2021-01-14 | Discharge: 2021-01-14 | Disposition: A | Discharge status: Home / Self Care | Payer: Medicare Other | Attending: Emergency Medicine | Admitting: Emergency Medicine

## 2021-01-14 ENCOUNTER — Encounter (HOSPITAL_COMMUNITY): Payer: Self-pay | Admitting: *Deleted

## 2021-01-14 DIAGNOSIS — R4781 Slurred speech: Secondary | ICD-10-CM | POA: Diagnosis not present

## 2021-01-14 DIAGNOSIS — Z7901 Long term (current) use of anticoagulants: Secondary | ICD-10-CM | POA: Diagnosis not present

## 2021-01-14 DIAGNOSIS — R404 Transient alteration of awareness: Secondary | ICD-10-CM | POA: Diagnosis not present

## 2021-01-14 DIAGNOSIS — Z043 Encounter for examination and observation following other accident: Secondary | ICD-10-CM | POA: Diagnosis not present

## 2021-01-14 DIAGNOSIS — I6782 Cerebral ischemia: Secondary | ICD-10-CM | POA: Diagnosis not present

## 2021-01-14 DIAGNOSIS — Z743 Need for continuous supervision: Secondary | ICD-10-CM | POA: Diagnosis not present

## 2021-01-14 DIAGNOSIS — G319 Degenerative disease of nervous system, unspecified: Secondary | ICD-10-CM | POA: Diagnosis not present

## 2021-01-14 DIAGNOSIS — W19XXXA Unspecified fall, initial encounter: Secondary | ICD-10-CM

## 2021-01-14 DIAGNOSIS — S199XXA Unspecified injury of neck, initial encounter: Secondary | ICD-10-CM | POA: Diagnosis not present

## 2021-01-14 DIAGNOSIS — Z87891 Personal history of nicotine dependence: Secondary | ICD-10-CM | POA: Insufficient documentation

## 2021-01-14 DIAGNOSIS — Z23 Encounter for immunization: Secondary | ICD-10-CM | POA: Diagnosis not present

## 2021-01-14 DIAGNOSIS — W182XXA Fall in (into) shower or empty bathtub, initial encounter: Secondary | ICD-10-CM | POA: Insufficient documentation

## 2021-01-14 DIAGNOSIS — S0990XA Unspecified injury of head, initial encounter: Secondary | ICD-10-CM | POA: Diagnosis not present

## 2021-01-14 DIAGNOSIS — S8991XA Unspecified injury of right lower leg, initial encounter: Secondary | ICD-10-CM | POA: Diagnosis present

## 2021-01-14 DIAGNOSIS — I4891 Unspecified atrial fibrillation: Secondary | ICD-10-CM | POA: Diagnosis not present

## 2021-01-14 DIAGNOSIS — R0902 Hypoxemia: Secondary | ICD-10-CM | POA: Diagnosis not present

## 2021-01-14 DIAGNOSIS — R58 Hemorrhage, not elsewhere classified: Secondary | ICD-10-CM | POA: Diagnosis not present

## 2021-01-14 DIAGNOSIS — I728 Aneurysm of other specified arteries: Secondary | ICD-10-CM | POA: Diagnosis not present

## 2021-01-14 DIAGNOSIS — I639 Cerebral infarction, unspecified: Secondary | ICD-10-CM | POA: Diagnosis not present

## 2021-01-14 DIAGNOSIS — S81811A Laceration without foreign body, right lower leg, initial encounter: Secondary | ICD-10-CM | POA: Diagnosis not present

## 2021-01-14 DIAGNOSIS — M7989 Other specified soft tissue disorders: Secondary | ICD-10-CM | POA: Diagnosis not present

## 2021-01-14 DIAGNOSIS — M4319 Spondylolisthesis, multiple sites in spine: Secondary | ICD-10-CM | POA: Diagnosis not present

## 2021-01-14 HISTORY — DX: Unspecified atrial fibrillation: I48.91

## 2021-01-14 HISTORY — DX: Cerebral infarction, unspecified: I63.9

## 2021-01-14 LAB — CBC WITH DIFFERENTIAL/PLATELET
Abs Immature Granulocytes: 0.03 10*3/uL (ref 0.00–0.07)
Basophils Absolute: 0.1 10*3/uL (ref 0.0–0.1)
Basophils Relative: 1 %
Eosinophils Absolute: 0.1 10*3/uL (ref 0.0–0.5)
Eosinophils Relative: 2 %
HCT: 47.1 % — ABNORMAL HIGH (ref 36.0–46.0)
Hemoglobin: 15.2 g/dL — ABNORMAL HIGH (ref 12.0–15.0)
Immature Granulocytes: 0 %
Lymphocytes Relative: 10 %
Lymphs Abs: 0.9 10*3/uL (ref 0.7–4.0)
MCH: 30.8 pg (ref 26.0–34.0)
MCHC: 32.3 g/dL (ref 30.0–36.0)
MCV: 95.3 fL (ref 80.0–100.0)
Monocytes Absolute: 0.5 10*3/uL (ref 0.1–1.0)
Monocytes Relative: 6 %
Neutro Abs: 7.2 10*3/uL (ref 1.7–7.7)
Neutrophils Relative %: 81 %
Platelets: 235 10*3/uL (ref 150–400)
RBC: 4.94 MIL/uL (ref 3.87–5.11)
RDW: 15 % (ref 11.5–15.5)
WBC: 8.9 10*3/uL (ref 4.0–10.5)
nRBC: 0 % (ref 0.0–0.2)

## 2021-01-14 LAB — COMPREHENSIVE METABOLIC PANEL
ALT: 10 U/L (ref 0–44)
AST: 17 U/L (ref 15–41)
Albumin: 3.7 g/dL (ref 3.5–5.0)
Alkaline Phosphatase: 67 U/L (ref 38–126)
Anion gap: 9 (ref 5–15)
BUN: 11 mg/dL (ref 8–23)
CO2: 24 mmol/L (ref 22–32)
Calcium: 9.4 mg/dL (ref 8.9–10.3)
Chloride: 107 mmol/L (ref 98–111)
Creatinine, Ser: 1 mg/dL (ref 0.44–1.00)
GFR, Estimated: 54 mL/min — ABNORMAL LOW (ref >60)
Glucose, Bld: 110 mg/dL — ABNORMAL HIGH (ref 70–99)
Potassium: 4 mmol/L (ref 3.5–5.1)
Sodium: 140 mmol/L (ref 135–145)
Total Bilirubin: 1.1 mg/dL (ref 0.3–1.2)
Total Protein: 6 g/dL — ABNORMAL LOW (ref 6.5–8.1)

## 2021-01-14 LAB — PROTIME-INR
INR: 1.2 (ref 0.8–1.2)
Prothrombin Time: 15 seconds (ref 11.4–15.2)

## 2021-01-14 MED ORDER — LIDOCAINE-EPINEPHRINE (PF) 2 %-1:200000 IJ SOLN
10.0000 mL | Freq: Once | INTRAMUSCULAR | Status: AC
  Administered 2021-01-14: 10 mL
  Filled 2021-01-14: qty 20

## 2021-01-14 MED ORDER — TETANUS-DIPHTH-ACELL PERTUSSIS 5-2.5-18.5 LF-MCG/0.5 IM SUSY
0.5000 mL | PREFILLED_SYRINGE | Freq: Once | INTRAMUSCULAR | Status: AC
  Administered 2021-01-14: 0.5 mL via INTRAMUSCULAR
  Filled 2021-01-14: qty 0.5

## 2021-01-14 MED ORDER — METOPROLOL TARTRATE 25 MG PO TABS
25.0000 mg | ORAL_TABLET | Freq: Once | ORAL | Status: AC
  Administered 2021-01-14: 25 mg via ORAL
  Filled 2021-01-14: qty 1

## 2021-01-14 MED ORDER — CEPHALEXIN 500 MG PO CAPS: 500.0000 mg | ORAL_CAPSULE | Freq: Three times a day (TID) | ORAL | 0 refills | Status: AC

## 2021-01-14 NOTE — Progress Notes (Signed)
   01/14/21 0800  Clinical Encounter Type  Visited With Patient not available  Visit Type Trauma  Referral From Nurse  Consult/Referral To Chaplain   Chaplain responded to level 2 page. The patient is being attended to by the medical team.  There is no family present. Chaplain remains available. This note was prepared by Jeanine Luz, M.Div..  For questions please contact by phone [phone].

## 2021-01-14 NOTE — Progress Notes (Signed)
Orthopedic Tech Progress Note Patient Details:  Peggy Williams 09/11/1873 [id number] Level 2 trauma  Patient ID: Peggy Williams, female   DOB: 09/11/1873, 87 y.o.   MRN: [id number]   Germaine Pomfret 01/14/2021, 8:35 AM

## 2021-01-14 NOTE — Discharge Instructions (Addendum)
You have 11 sutures, 5 mattress sutures and 6 simple interrupted sutures, that will need removal in about 1 week.  You  will need to apply wet-to-dry dressings over the lower leg twice a day by soaking 1 piece of gauze and sterile saline, laying it over the wound, laying a dry piece of gauze over it, and wrapping it in Curlex.  You need to come back to the ER for fevers, purulent discharge from your wound, swelling or redness or warmth of your wound.  Otherwise you need to follow-up with plastic surgery or a wound care physician or your primary care doctor; contact information is listed in your paperwork.  Also take the antibiotic as prescribed to prevent wound infection.

## 2021-01-14 NOTE — ED Triage Notes (Signed)
Pt was getting out of shower and tripped.  Possibly hitting head.  No loc.  Pt with some aphasia, but per family that is her norm from previous strokes.  No bleeding/hematomas to head.  Bleeding to RLE.

## 2021-01-14 NOTE — ED Notes (Signed)
Pt went home with Blima Singer, pt's "niece"  [phone]

## 2021-01-14 NOTE — ED Provider Notes (Signed)
[facility] EMERGENCY DEPARTMENT Provider Note   CSN: [id number] Arrival date & time: 01/14/21  0833     History Chief Complaint  Patient presents with  . Fall    Peggy Williams is a 87 y.o. female.  HPI fell while in the shower.  Mechanical fall per patient's report.  No LOC, chest pain, shortness of breath, headache.  Has dysarthria which is reportedly her baseline per the patient.  History of stroke.  She denies headache, visual disturbance, or neck pain right now.  She says that she feels fine.  She has a MOST form at bedside which endorses a desire for a natural death and endorses limited scope of treatment.  History of A. fib and takes a blood thinner although she is not sure what it is called.     Past Medical History:  Diagnosis Date  . A-fib ([place])   . Stroke [facility])     There are no problems to display for this patient.   History reviewed. No pertinent surgical history.   OB History   No obstetric history on file.     No family history on file.  Social History   Tobacco Use  . Smoking status: Former Research scientist (life sciences)  . Smokeless tobacco: Never Used  Substance Use Topics  . Alcohol use: Yes  . Drug use: Not Currently    Home Medications Prior to Admission medications   Not on File    Allergies    Patient has no allergy information on record.  Review of Systems   Review of Systems  Constitutional: Negative for chills and fever.  HENT: Negative for ear pain and sore throat.   Eyes: Negative for pain and visual disturbance.  Respiratory: Negative for cough and shortness of breath.   Cardiovascular: Negative for chest pain and palpitations.  Gastrointestinal: Negative for abdominal pain and vomiting.  Genitourinary: Negative for dysuria and hematuria.  Musculoskeletal: Negative for arthralgias and back pain.  Skin: Negative for color change and rash.  Neurological: Negative for seizures and syncope.  All other systems reviewed and are  negative.   Physical Exam Updated Vital Signs BP (!) 152/112   Pulse (!) 105   Temp 98 F (36.7 C) (Oral)   Resp 18   SpO2 95%   Physical Exam Vitals and nursing note reviewed.  Constitutional:      General: She is not in acute distress.    Appearance: She is well-developed.  HENT:     Head: Normocephalic and atraumatic.  Eyes:     Conjunctiva/sclera: Conjunctivae normal.  Cardiovascular:     Rate and Rhythm: Normal rate. Rhythm irregular.     Heart sounds: No murmur heard.   Pulmonary:     Effort: Pulmonary effort is normal. No respiratory distress.     Breath sounds: Normal breath sounds.  Abdominal:     Palpations: Abdomen is soft.     Tenderness: There is no abdominal tenderness.  Musculoskeletal:        General: Signs of injury present.     Cervical back: Neck supple.     Comments: Approximately 3 cm horizontal right lower extremity laceration with adipose exposed but bleeding is controlled. NVI bl with Dps and light touch sensatoin and ROM intact  Skin:    General: Skin is warm and dry.  Neurological:     Mental Status: She is alert. Mental status is at baseline.     Motor: No weakness.   Scalp atraumatic Forehead stable  to palpation PERRL EOMI bl Midface stable nontender No intraoral injury  C-spine nontender T-spine nontender L-spine nontender No stepoffs or deformity  Clavicles stable nontender bilaterally Chest wall stable to AP and lat compression Abdominal pain nontender Bilateral radial pulses, bilateral DP pulses intact  ED Results / Procedures / Treatments   Labs (all labs ordered are listed, but only abnormal results are displayed) Labs Reviewed  PROTIME-INR  CBC WITH DIFFERENTIAL/PLATELET  COMPREHENSIVE METABOLIC PANEL    EKG EKG Interpretation  Date/Time:  Friday Jan 14 2021 08:59:07 EDT Ventricular Rate:  107 PR Interval:    QRS Duration: 96 QT Interval:  312 QTC Calculation: 417 R Axis:   77 Text Interpretation: Atrial  fibrillation Borderline repolarization abnormality No previous ECGs available Confirmed by Fredia Sorrow [id number]) on 01/14/2021 9:14:05 AM   Radiology No results found.  Procedures .Marland KitchenLaceration Repair  Date/Time: 01/14/2021 11:26 AM Performed by: Aris Lot, MD Authorized by: Fredia Sorrow, MD   Consent:    Consent obtained:  Verbal   Consent given by:  Patient Anesthesia:    Anesthesia method:  Local infiltration   Local anesthetic:  Lidocaine 2% WITH epi Laceration details:    Location:  Leg   Leg location:  R lower leg   Length (cm):  5   Depth (mm):  3 Exploration:    Limited defect created (wound extended): no     Hemostasis achieved with:  Epinephrine and direct pressure   Imaging obtained: x-ray     Imaging outcome: foreign body not noted     Wound extent: no foreign bodies/material noted, no muscle damage noted, no nerve damage noted, no tendon damage noted and no underlying fracture noted     Contaminated: no   Treatment:    Area cleansed with:  Saline   Amount of cleaning:  Extensive   Irrigation solution:  Sterile saline   Irrigation volume:  800cc   Irrigation method:  Pressure wash   Visualized foreign bodies/material removed: no     Debridement:  Moderate   Undermining:  None   Scar revision: no   Skin repair:    Repair method:  Sutures   Suture size:  3-0   Suture material:  Nylon   Suture technique:  Horizontal mattress and simple interrupted   Number of sutures: 5 horizontal mattress for wound approximation, 6 simple interrupted for superficial layer closure. Approximation:    Laceration repair approximation: close except for center which, despite debridement and mattress placement, did not have skin of tensile strength amenable to closure. Skin tore consistently with multiple modalities of closure. Repair type:    Repair type:  Complex Post-procedure details:    Dressing: wet to dry dressing with kerlix over.   Procedure completion:   Tolerated     Medications Ordered in ED Medications  Tdap (BOOSTRIX) injection 0.5 mL (0.5 mLs Intramuscular Given 01/14/21 0855)    ED Course  I have reviewed the triage vital signs and the nursing notes.  Pertinent labs & imaging results that were available during my care of the patient were reviewed by me and considered in my medical decision making (see chart for details).  Clinical Course as of 01/14/21 1123  Fri Jan 14, 2021  0930 DG Tibia/Fibula Right [AS]    Clinical Course User Index [AS] Aris Lot, MD   MDM Rules/Calculators/A&P                          87 year old  female presents with mechanical fall.  Given her history of blood thinner use and possibility of hitting her head CT and C-spine were ordered.  She does have an overall reassuring neurologic exam and is at her baseline.  Only traumatic injury detected on exam is a laceration to her right lower extremity.  EKG was obtained and shows irregular rhythm, normal rate, no focal ischemic changes, no previous for comparison. XRs obtained, no fracture. Laceration repair performed as per procedure note; this repair is complicated by her very thin skin inferiorly significant exposure of adipose tissue and despite multiple modalities and attempts of repair, the middle of the laceration still had some exposed adipose that could not be fully managed.  I counseled the patient and her blood tone at bedside on this, gave strict return precautions regarding wound infection, and showed him how to apply a wet-to-dry dressing, and also provided instructions on follow-up.  Will also prescribe abx given this. CT scans did not show any acute intracranial hemorrhage or spine fracture.  Neck brace was successfully cleared with no tenderness and full range of motion without pain on repeat exam.  Patient discharged in the care of her loved 1 in stable condition without acute event.   Final Clinical Impression(s) / ED Diagnoses Final  diagnoses:  None    Rx / DC Orders ED Discharge Orders    None       Aris Lot, MD 01/14/21 1244    Fredia Sorrow, MD 01/15/21 1535

## 2021-01-17 ENCOUNTER — Encounter (HOSPITAL_COMMUNITY): Payer: Self-pay

## 2021-01-18 ENCOUNTER — Institutional Professional Consult (permissible substitution): Payer: Medicare Other | Admitting: Pulmonary Disease

## 2021-01-18 DIAGNOSIS — W19XXXD Unspecified fall, subsequent encounter: Secondary | ICD-10-CM | POA: Diagnosis not present

## 2021-01-18 DIAGNOSIS — R2689 Other abnormalities of gait and mobility: Secondary | ICD-10-CM | POA: Diagnosis not present

## 2021-01-19 ENCOUNTER — Institutional Professional Consult (permissible substitution): Payer: Medicare Other | Admitting: Pulmonary Disease

## 2021-01-19 NOTE — Progress Notes (Deleted)
Synopsis: Referred in *** for ***  Subjective:   PATIENT ID: Peggy Williams GENDER: female DOB: July 09, 1929, MRN: [id number]   HPI  No chief complaint on file.   ***  Past Medical History:  Diagnosis Date  . A-fib ([place])   . Arthritis   . Cataract   . Stroke [facility])    complication of aneurysm repair  . Stroke ([place])   . Thyroid disease      No family history on file.   Social History   Socioeconomic History  . Marital status: Widowed    Spouse name: Not on file  . Number of children: Not on file  . Years of education: Not on file  . Highest education level: Not on file  Occupational History  . Occupation: retired  Tobacco Use  . Smoking status: Former Smoker    Packs/day: 1.00    Years: 45.00    Pack years: 45.00    Types: Cigarettes    Quit date: 2019    Years since quitting: 3.3  . Smokeless tobacco: Never Used  Vaping Use  . Vaping Use: Never used  Substance and Sexual Activity  . Alcohol use: Yes  . Drug use: Not Currently  . Sexual activity: Never  Other Topics Concern  . Not on file  Social History Narrative   ** Merged History Encounter **       Social Determinants of Health   Financial Resource Strain: Not on file  Food Insecurity: Not on file  Transportation Needs: Not on file  Physical Activity: Not on file  Stress: Not on file  Social Connections: Not on file  Intimate Partner Violence: Not on file     No Known Allergies   Outpatient Medications Prior to Visit  Medication Sig Dispense Refill  . apixaban (ELIQUIS) 5 MG TABS tablet Take 1 tablet (5 mg total) by mouth 2 (two) times daily. 180 tablet 3  . cephALEXin (KEFLEX) 500 MG capsule Take 1 capsule (500 mg total) by mouth 3 (three) times daily for 7 days. 21 capsule 0  . digoxin (LANOXIN) 0.125 MG tablet Take 1 tablet (0.125 mg total) by mouth daily. 90 tablet 3  . diltiazem (CARDIZEM CD) 240 MG 24 hr capsule Take 1 capsule (240 mg total) by mouth daily. 90 capsule 3  . furosemide  (LASIX) 20 MG tablet Take 1 tablet (20 mg total) by mouth daily. 90 tablet 3  . levothyroxine (SYNTHROID, LEVOTHROID) 50 MCG tablet Take 50 mcg by mouth daily before breakfast.    . metoprolol succinate (TOPROL-XL) 25 MG 24 hr tablet Take 0.5 tablets (12.5 mg total) by mouth in the morning and at bedtime. 90 tablet 3  . Multiple Vitamins-Minerals (OCUVITE ADULT 50+) CAPS Take 1 capsule by mouth daily in the afternoon.    . ondansetron (ZOFRAN) 4 MG tablet Take 1 tablet (4 mg total) by mouth every 6 (six) hours. 12 tablet 0  . potassium chloride (KLOR-CON) 10 MEQ tablet Take 1 tablet (10 mEq total) by mouth daily. 90 tablet 3  . valACYclovir (VALTREX) 1000 MG tablet TAKE 1 TABLET (1,000 MG TOTAL) BY MOUTH THREE TIMES DAILY FOR 5 DAYS. 15 tablet 0  . Vitamin D-Vitamin K (VITAMIN K2-VITAMIN D3) 45-2000 MCG-UNIT CAPS Take 1 capsule by mouth daily in the afternoon.     No facility-administered medications prior to visit.    ROS    Objective:  There were no vitals filed for this visit.   Physical Exam  CBC    Component Value Date/Time   WBC 8.9 01/14/2021 0909   RBC 4.94 01/14/2021 0909   HGB 15.2 (H) 01/14/2021 0909   HCT 47.1 (H) 01/14/2021 0909   PLT 235 01/14/2021 0909   MCV 95.3 01/14/2021 0909   MCV 100.7 (A) 09/01/2013 1554   MCH 30.8 01/14/2021 0909   MCHC 32.3 01/14/2021 0909   RDW 15.0 01/14/2021 0909   LYMPHSABS 0.9 01/14/2021 0909   MONOABS 0.5 01/14/2021 0909   EOSABS 0.1 01/14/2021 0909   BASOSABS 0.1 01/14/2021 0909     Chest imaging:  PFT: No flowsheet data found.  Labs:  Path:  Echo:  Heart Catheterization:       Assessment & Plan:   No diagnosis found.  Discussion: ***    Current Outpatient Medications:  .  apixaban (ELIQUIS) 5 MG TABS tablet, Take 1 tablet (5 mg total) by mouth 2 (two) times daily., Disp: 180 tablet, Rfl: 3 .  cephALEXin (KEFLEX) 500 MG capsule, Take 1 capsule (500 mg total) by mouth 3 (three) times daily for 7  days., Disp: 21 capsule, Rfl: 0 .  digoxin (LANOXIN) 0.125 MG tablet, Take 1 tablet (0.125 mg total) by mouth daily., Disp: 90 tablet, Rfl: 3 .  diltiazem (CARDIZEM CD) 240 MG 24 hr capsule, Take 1 capsule (240 mg total) by mouth daily., Disp: 90 capsule, Rfl: 3 .  furosemide (LASIX) 20 MG tablet, Take 1 tablet (20 mg total) by mouth daily., Disp: 90 tablet, Rfl: 3 .  levothyroxine (SYNTHROID, LEVOTHROID) 50 MCG tablet, Take 50 mcg by mouth daily before breakfast., Disp: , Rfl:  .  metoprolol succinate (TOPROL-XL) 25 MG 24 hr tablet, Take 0.5 tablets (12.5 mg total) by mouth in the morning and at bedtime., Disp: 90 tablet, Rfl: 3 .  Multiple Vitamins-Minerals (OCUVITE ADULT 50+) CAPS, Take 1 capsule by mouth daily in the afternoon., Disp: , Rfl:  .  ondansetron (ZOFRAN) 4 MG tablet, Take 1 tablet (4 mg total) by mouth every 6 (six) hours., Disp: 12 tablet, Rfl: 0 .  potassium chloride (KLOR-CON) 10 MEQ tablet, Take 1 tablet (10 mEq total) by mouth daily., Disp: 90 tablet, Rfl: 3 .  valACYclovir (VALTREX) 1000 MG tablet, TAKE 1 TABLET (1,000 MG TOTAL) BY MOUTH THREE TIMES DAILY FOR 5 DAYS., Disp: 15 tablet, Rfl: 0 .  Vitamin D-Vitamin K (VITAMIN K2-VITAMIN D3) 45-2000 MCG-UNIT CAPS, Take 1 capsule by mouth daily in the afternoon., Disp: , Rfl:

## 2021-01-21 DIAGNOSIS — W19XXXD Unspecified fall, subsequent encounter: Secondary | ICD-10-CM | POA: Diagnosis not present

## 2021-01-21 DIAGNOSIS — R2689 Other abnormalities of gait and mobility: Secondary | ICD-10-CM | POA: Diagnosis not present

## 2021-01-25 DIAGNOSIS — R2689 Other abnormalities of gait and mobility: Secondary | ICD-10-CM | POA: Diagnosis not present

## 2021-01-25 DIAGNOSIS — L821 Other seborrheic keratosis: Secondary | ICD-10-CM | POA: Diagnosis not present

## 2021-01-25 DIAGNOSIS — S80811A Abrasion, right lower leg, initial encounter: Secondary | ICD-10-CM | POA: Diagnosis not present

## 2021-01-25 DIAGNOSIS — W19XXXD Unspecified fall, subsequent encounter: Secondary | ICD-10-CM | POA: Diagnosis not present

## 2021-01-25 DIAGNOSIS — D692 Other nonthrombocytopenic purpura: Secondary | ICD-10-CM | POA: Diagnosis not present

## 2021-01-25 DIAGNOSIS — L570 Actinic keratosis: Secondary | ICD-10-CM | POA: Diagnosis not present

## 2021-01-25 DIAGNOSIS — Z85828 Personal history of other malignant neoplasm of skin: Secondary | ICD-10-CM | POA: Diagnosis not present

## 2021-01-28 DIAGNOSIS — W19XXXD Unspecified fall, subsequent encounter: Secondary | ICD-10-CM | POA: Diagnosis not present

## 2021-01-28 DIAGNOSIS — R2689 Other abnormalities of gait and mobility: Secondary | ICD-10-CM | POA: Diagnosis not present

## 2021-01-31 DIAGNOSIS — Z4802 Encounter for removal of sutures: Secondary | ICD-10-CM | POA: Diagnosis not present

## 2021-01-31 DIAGNOSIS — S80812A Abrasion, left lower leg, initial encounter: Secondary | ICD-10-CM | POA: Diagnosis not present

## 2021-02-01 DIAGNOSIS — R2689 Other abnormalities of gait and mobility: Secondary | ICD-10-CM | POA: Diagnosis not present

## 2021-02-01 DIAGNOSIS — W19XXXD Unspecified fall, subsequent encounter: Secondary | ICD-10-CM | POA: Diagnosis not present

## 2021-02-04 DIAGNOSIS — H353133 Nonexudative age-related macular degeneration, bilateral, advanced atrophic without subfoveal involvement: Secondary | ICD-10-CM | POA: Diagnosis not present

## 2021-02-08 DIAGNOSIS — R2689 Other abnormalities of gait and mobility: Secondary | ICD-10-CM | POA: Diagnosis not present

## 2021-02-08 DIAGNOSIS — W19XXXD Unspecified fall, subsequent encounter: Secondary | ICD-10-CM | POA: Diagnosis not present

## 2021-03-01 DIAGNOSIS — D692 Other nonthrombocytopenic purpura: Secondary | ICD-10-CM | POA: Diagnosis not present

## 2021-03-01 DIAGNOSIS — L820 Inflamed seborrheic keratosis: Secondary | ICD-10-CM | POA: Diagnosis not present

## 2021-03-01 DIAGNOSIS — Z85828 Personal history of other malignant neoplasm of skin: Secondary | ICD-10-CM | POA: Diagnosis not present

## 2021-03-01 DIAGNOSIS — L570 Actinic keratosis: Secondary | ICD-10-CM | POA: Diagnosis not present

## 2021-03-23 DIAGNOSIS — H903 Sensorineural hearing loss, bilateral: Secondary | ICD-10-CM | POA: Diagnosis not present

## 2021-05-26 DIAGNOSIS — Z23 Encounter for immunization: Secondary | ICD-10-CM | POA: Diagnosis not present

## 2021-06-28 DIAGNOSIS — Z85828 Personal history of other malignant neoplasm of skin: Secondary | ICD-10-CM | POA: Diagnosis not present

## 2021-06-28 DIAGNOSIS — L821 Other seborrheic keratosis: Secondary | ICD-10-CM | POA: Diagnosis not present

## 2021-06-28 DIAGNOSIS — L570 Actinic keratosis: Secondary | ICD-10-CM | POA: Diagnosis not present

## 2021-07-13 DIAGNOSIS — I483 Typical atrial flutter: Secondary | ICD-10-CM | POA: Diagnosis not present

## 2021-07-13 DIAGNOSIS — E7800 Pure hypercholesterolemia, unspecified: Secondary | ICD-10-CM | POA: Diagnosis not present

## 2021-07-13 DIAGNOSIS — E039 Hypothyroidism, unspecified: Secondary | ICD-10-CM | POA: Diagnosis not present

## 2021-07-13 DIAGNOSIS — Z1389 Encounter for screening for other disorder: Secondary | ICD-10-CM | POA: Diagnosis not present

## 2021-07-13 DIAGNOSIS — Z0000 Encounter for general adult medical examination without abnormal findings: Secondary | ICD-10-CM | POA: Diagnosis not present

## 2021-07-13 DIAGNOSIS — I700 Atherosclerosis of aorta: Secondary | ICD-10-CM | POA: Diagnosis not present

## 2021-07-13 DIAGNOSIS — Z79899 Other long term (current) drug therapy: Secondary | ICD-10-CM | POA: Diagnosis not present

## 2021-07-13 DIAGNOSIS — R269 Unspecified abnormalities of gait and mobility: Secondary | ICD-10-CM | POA: Diagnosis not present

## 2021-07-13 DIAGNOSIS — I10 Essential (primary) hypertension: Secondary | ICD-10-CM | POA: Diagnosis not present

## 2021-07-13 DIAGNOSIS — I69320 Aphasia following cerebral infarction: Secondary | ICD-10-CM | POA: Diagnosis not present

## 2021-07-13 DIAGNOSIS — Z23 Encounter for immunization: Secondary | ICD-10-CM | POA: Diagnosis not present

## 2021-08-02 ENCOUNTER — Other Ambulatory Visit: Payer: Self-pay

## 2021-08-02 ENCOUNTER — Other Ambulatory Visit: Payer: Self-pay | Admitting: *Deleted

## 2021-08-02 DIAGNOSIS — I4892 Unspecified atrial flutter: Secondary | ICD-10-CM

## 2021-08-02 DIAGNOSIS — I4891 Unspecified atrial fibrillation: Secondary | ICD-10-CM

## 2021-08-02 MED ORDER — DILTIAZEM HCL ER COATED BEADS 240 MG PO CP24: 240.0000 mg | ORAL_CAPSULE | Freq: Every day | ORAL | 1 refills | Status: DC

## 2021-08-02 MED ORDER — APIXABAN 5 MG PO TABS: 5.0000 mg | ORAL_TABLET | Freq: Two times a day (BID) | ORAL | 1 refills | Status: AC

## 2021-08-02 NOTE — Telephone Encounter (Signed)
Eliquis 5mg  paper refill request received. Patient is 87 years old, weight-65.8kg, Crea-1.00 on 01/14/2021, Diagnosis-Afib, and last seen by Dr. Johney Frame on 12/09/2020. Dose is appropriate based on dosing criteria. Will send in refill to requested pharmacy.

## 2021-08-12 DIAGNOSIS — H353221 Exudative age-related macular degeneration, left eye, with active choroidal neovascularization: Secondary | ICD-10-CM | POA: Diagnosis not present

## 2021-08-12 DIAGNOSIS — H353113 Nonexudative age-related macular degeneration, right eye, advanced atrophic without subfoveal involvement: Secondary | ICD-10-CM | POA: Diagnosis not present

## 2021-08-12 DIAGNOSIS — H5213 Myopia, bilateral: Secondary | ICD-10-CM | POA: Diagnosis not present

## 2021-08-13 DIAGNOSIS — I4891 Unspecified atrial fibrillation: Secondary | ICD-10-CM | POA: Diagnosis not present

## 2021-08-13 DIAGNOSIS — R0902 Hypoxemia: Secondary | ICD-10-CM | POA: Diagnosis not present

## 2021-08-13 DIAGNOSIS — R531 Weakness: Secondary | ICD-10-CM | POA: Diagnosis not present

## 2021-08-19 DIAGNOSIS — H35363 Drusen (degenerative) of macula, bilateral: Secondary | ICD-10-CM | POA: Diagnosis not present

## 2021-08-19 DIAGNOSIS — H353113 Nonexudative age-related macular degeneration, right eye, advanced atrophic without subfoveal involvement: Secondary | ICD-10-CM | POA: Diagnosis not present

## 2021-08-19 DIAGNOSIS — H353221 Exudative age-related macular degeneration, left eye, with active choroidal neovascularization: Secondary | ICD-10-CM | POA: Diagnosis not present

## 2021-08-19 DIAGNOSIS — H43812 Vitreous degeneration, left eye: Secondary | ICD-10-CM | POA: Diagnosis not present

## 2021-08-22 ENCOUNTER — Other Ambulatory Visit: Payer: Self-pay

## 2021-08-22 MED ORDER — DIGOXIN 125 MCG PO TABS: 0.1250 mg | ORAL_TABLET | Freq: Every day | ORAL | 0 refills | Status: DC

## 2021-08-30 DIAGNOSIS — H353221 Exudative age-related macular degeneration, left eye, with active choroidal neovascularization: Secondary | ICD-10-CM | POA: Diagnosis not present

## 2021-09-19 ENCOUNTER — Other Ambulatory Visit: Payer: Self-pay

## 2021-09-19 MED ORDER — DIGOXIN 125 MCG PO TABS: 0.1250 mg | ORAL_TABLET | Freq: Every day | ORAL | 0 refills | Status: DC

## 2021-10-03 ENCOUNTER — Other Ambulatory Visit: Payer: Self-pay | Admitting: Cardiology

## 2021-10-03 DIAGNOSIS — H353221 Exudative age-related macular degeneration, left eye, with active choroidal neovascularization: Secondary | ICD-10-CM | POA: Diagnosis not present

## 2021-10-03 DIAGNOSIS — H353113 Nonexudative age-related macular degeneration, right eye, advanced atrophic without subfoveal involvement: Secondary | ICD-10-CM | POA: Diagnosis not present

## 2021-10-03 DIAGNOSIS — Z79899 Other long term (current) drug therapy: Secondary | ICD-10-CM

## 2021-11-01 DIAGNOSIS — H353113 Nonexudative age-related macular degeneration, right eye, advanced atrophic without subfoveal involvement: Secondary | ICD-10-CM | POA: Diagnosis not present

## 2021-11-01 DIAGNOSIS — H353221 Exudative age-related macular degeneration, left eye, with active choroidal neovascularization: Secondary | ICD-10-CM | POA: Diagnosis not present

## 2021-11-15 ENCOUNTER — Other Ambulatory Visit: Payer: Self-pay | Admitting: *Deleted

## 2021-11-15 MED ORDER — POTASSIUM CHLORIDE ER 10 MEQ PO TBCR: 10.0000 meq | EXTENDED_RELEASE_TABLET | Freq: Every day | ORAL | 0 refills | Status: DC

## 2021-11-15 MED ORDER — METOPROLOL SUCCINATE ER 25 MG PO TB24: 12.5000 mg | ORAL_TABLET | Freq: Two times a day (BID) | ORAL | 0 refills | Status: DC

## 2021-11-29 DIAGNOSIS — H43812 Vitreous degeneration, left eye: Secondary | ICD-10-CM | POA: Diagnosis not present

## 2021-11-29 DIAGNOSIS — H353221 Exudative age-related macular degeneration, left eye, with active choroidal neovascularization: Secondary | ICD-10-CM | POA: Diagnosis not present

## 2021-11-29 DIAGNOSIS — H353114 Nonexudative age-related macular degeneration, right eye, advanced atrophic with subfoveal involvement: Secondary | ICD-10-CM | POA: Diagnosis not present

## 2021-11-29 DIAGNOSIS — H35033 Hypertensive retinopathy, bilateral: Secondary | ICD-10-CM | POA: Diagnosis not present

## 2021-12-29 ENCOUNTER — Other Ambulatory Visit: Payer: Self-pay | Admitting: *Deleted

## 2021-12-29 MED ORDER — POTASSIUM CHLORIDE ER 10 MEQ PO TBCR: EXTENDED_RELEASE_TABLET | ORAL | 0 refills | Status: DC

## 2021-12-29 MED ORDER — METOPROLOL SUCCINATE ER 25 MG PO TB24: 12.5000 mg | ORAL_TABLET | Freq: Two times a day (BID) | ORAL | 0 refills | Status: DC

## 2022-01-04 ENCOUNTER — Other Ambulatory Visit: Payer: Self-pay

## 2022-01-04 MED ORDER — DIGOXIN 125 MCG PO TABS: 0.1250 mg | ORAL_TABLET | Freq: Every day | ORAL | 0 refills | Status: DC

## 2022-01-06 ENCOUNTER — Other Ambulatory Visit: Payer: Self-pay

## 2022-01-06 MED ORDER — DIGOXIN 125 MCG PO TABS: 0.1250 mg | ORAL_TABLET | Freq: Every day | ORAL | 0 refills | Status: AC

## 2022-01-09 DIAGNOSIS — I10 Essential (primary) hypertension: Secondary | ICD-10-CM | POA: Diagnosis not present

## 2022-01-09 DIAGNOSIS — E7800 Pure hypercholesterolemia, unspecified: Secondary | ICD-10-CM | POA: Diagnosis not present

## 2022-01-09 DIAGNOSIS — D6869 Other thrombophilia: Secondary | ICD-10-CM | POA: Diagnosis not present

## 2022-01-09 DIAGNOSIS — I700 Atherosclerosis of aorta: Secondary | ICD-10-CM | POA: Diagnosis not present

## 2022-01-09 DIAGNOSIS — Z79899 Other long term (current) drug therapy: Secondary | ICD-10-CM | POA: Diagnosis not present

## 2022-01-09 DIAGNOSIS — I483 Typical atrial flutter: Secondary | ICD-10-CM | POA: Diagnosis not present

## 2022-01-16 DIAGNOSIS — H353221 Exudative age-related macular degeneration, left eye, with active choroidal neovascularization: Secondary | ICD-10-CM | POA: Diagnosis not present

## 2022-01-17 ENCOUNTER — Other Ambulatory Visit: Payer: Self-pay | Admitting: *Deleted

## 2022-01-17 DIAGNOSIS — I4891 Unspecified atrial fibrillation: Secondary | ICD-10-CM

## 2022-01-17 DIAGNOSIS — I4892 Unspecified atrial flutter: Secondary | ICD-10-CM

## 2022-01-17 MED ORDER — DILTIAZEM HCL ER COATED BEADS 240 MG PO CP24: 240.0000 mg | ORAL_CAPSULE | Freq: Every day | ORAL | 0 refills | Status: DC

## 2022-01-17 NOTE — Telephone Encounter (Addendum)
Eliquis '5mg'$  paper refill request received. Patient is 88 years old, weight-65.8kg, Crea-0.93 on 01/09/2022 via [facility] from [place], [place], and last seen by Dr. Johney Frame on 12/09/2020-NEEDS AN APPT. Dose is appropriate based on dosing criteria.  ? ?Message sent to schedulers for an appointment.  ? ?Called pt and she asked me to call Jenny Reichmann about any questions I have and instructed her I would do so. Called son, Jenny Reichmann, and advised that the pt needed an appt with her Cardiologist. Jenny Reichmann stated that they have decided to let her PCP at [facility] manage her cardiac meds since the pt has been stable with her Afib. He asked which pharmacy it came from and advised it was a local one and he stated she receives her meds from mail order. He stated he would let the PCP know when a refill is needed. Advised I would deny this request.  ?

## 2022-01-19 ENCOUNTER — Other Ambulatory Visit: Payer: Self-pay

## 2022-01-19 DIAGNOSIS — I4891 Unspecified atrial fibrillation: Secondary | ICD-10-CM

## 2022-01-19 DIAGNOSIS — I4892 Unspecified atrial flutter: Secondary | ICD-10-CM

## 2022-01-19 NOTE — Telephone Encounter (Signed)
Per phone conversation on yesterday, Jenny Reichmann the pt's son, states that the pt is being followed for cardiac issues at her PCP office. He also, states she is not out of Eliquis and will notify PCP when the medication is needed. Will not fill prescription at this.  ?

## 2022-01-20 ENCOUNTER — Other Ambulatory Visit: Payer: Self-pay

## 2022-01-24 ENCOUNTER — Other Ambulatory Visit: Payer: Self-pay

## 2022-01-24 MED ORDER — METOPROLOL SUCCINATE ER 25 MG PO TB24: 12.5000 mg | ORAL_TABLET | Freq: Two times a day (BID) | ORAL | 0 refills | Status: AC

## 2022-01-26 ENCOUNTER — Other Ambulatory Visit: Payer: Self-pay | Admitting: *Deleted

## 2022-01-26 MED ORDER — POTASSIUM CHLORIDE ER 10 MEQ PO TBCR: EXTENDED_RELEASE_TABLET | ORAL | 0 refills | Status: DC

## 2022-02-02 DIAGNOSIS — Z23 Encounter for immunization: Secondary | ICD-10-CM | POA: Diagnosis not present

## 2022-02-07 ENCOUNTER — Other Ambulatory Visit: Payer: Self-pay | Admitting: *Deleted

## 2022-02-07 MED ORDER — POTASSIUM CHLORIDE ER 10 MEQ PO TBCR: EXTENDED_RELEASE_TABLET | ORAL | 0 refills | Status: AC

## 2022-02-22 ENCOUNTER — Ambulatory Visit: Payer: PRIVATE HEALTH INSURANCE | Admitting: Physician Assistant

## 2022-03-02 ENCOUNTER — Other Ambulatory Visit: Payer: Self-pay

## 2022-03-02 MED ORDER — DILTIAZEM HCL ER COATED BEADS 240 MG PO CP24: 240.0000 mg | ORAL_CAPSULE | Freq: Every day | ORAL | 0 refills | Status: AC

## 2022-03-10 DIAGNOSIS — H35033 Hypertensive retinopathy, bilateral: Secondary | ICD-10-CM | POA: Diagnosis not present

## 2022-03-10 DIAGNOSIS — H353221 Exudative age-related macular degeneration, left eye, with active choroidal neovascularization: Secondary | ICD-10-CM | POA: Diagnosis not present

## 2022-03-10 DIAGNOSIS — H353113 Nonexudative age-related macular degeneration, right eye, advanced atrophic without subfoveal involvement: Secondary | ICD-10-CM | POA: Diagnosis not present

## 2022-03-10 DIAGNOSIS — H43812 Vitreous degeneration, left eye: Secondary | ICD-10-CM | POA: Diagnosis not present

## 2022-03-10 IMAGING — CT CT RENAL STONE PROTOCOL
2 of 4 series · 16 of 46 positions shown, 18 images · non-contrast
Comparison: None.

CLINICAL DATA: Hematuria.  Left flank pain.

EXAM:
CT ABDOMEN AND PELVIS WITHOUT CONTRAST
TECHNIQUE: Multidetector CT imaging of the abdomen and pelvis was performed
following the standard protocol without IV contrast.

[Series 2: axial st · axial · 0.81mm/px · z∈[-458,-74]mm · 13 of 87 slices shown, 15 images]
[im 5/87  soft-tissue]
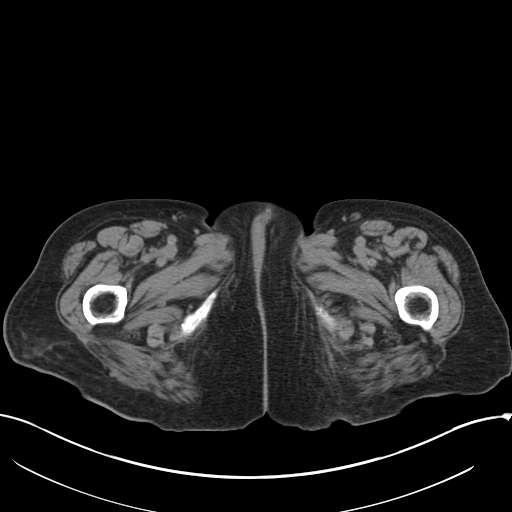
[im 5/87  bone]
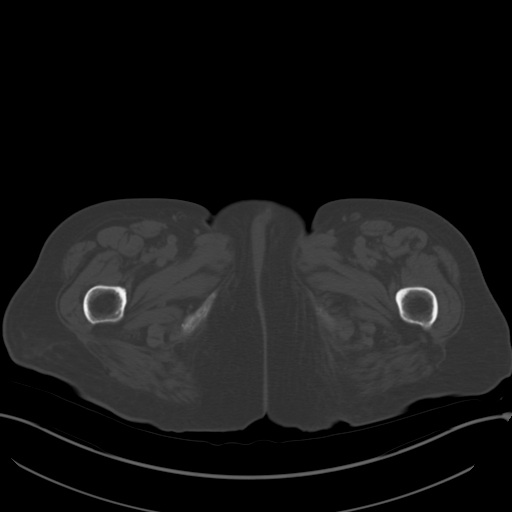
[im 10/87  soft-tissue]
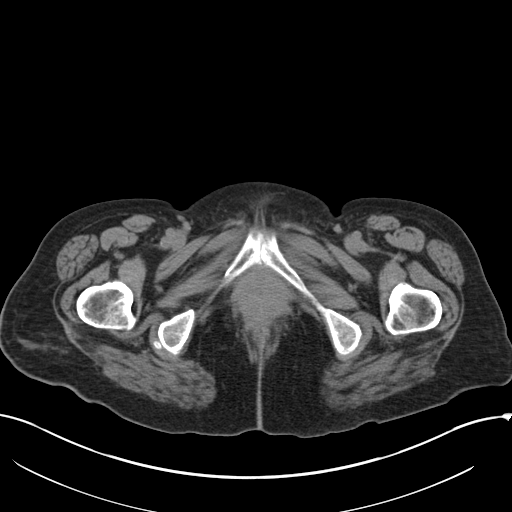
[im 20/87  soft-tissue]
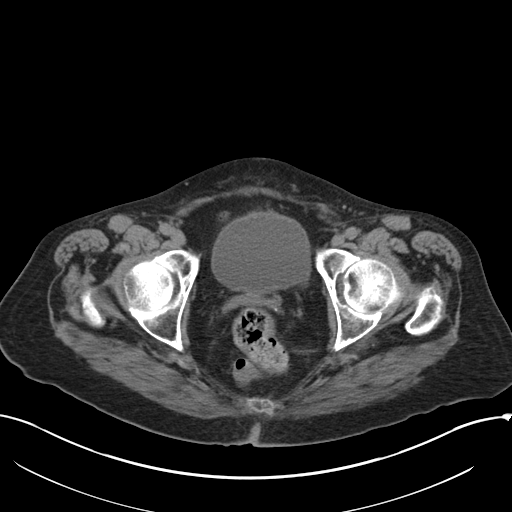
[im 24/87  soft-tissue]
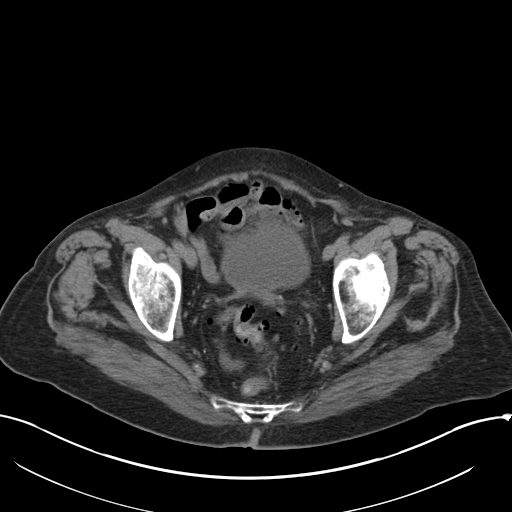
[im 29/87  soft-tissue]
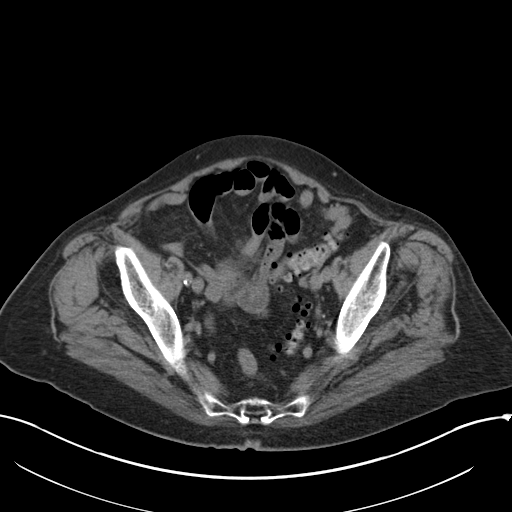
[im 39/87  soft-tissue]
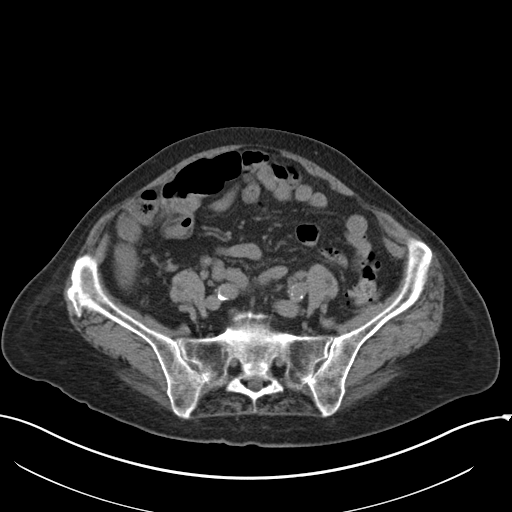
[im 44/87  soft-tissue]
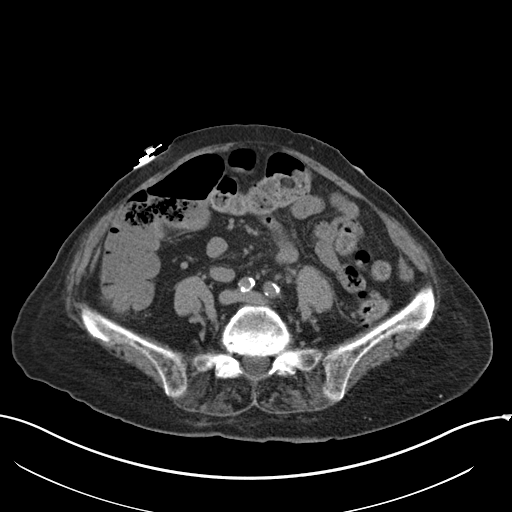
[im 48/87  soft-tissue]
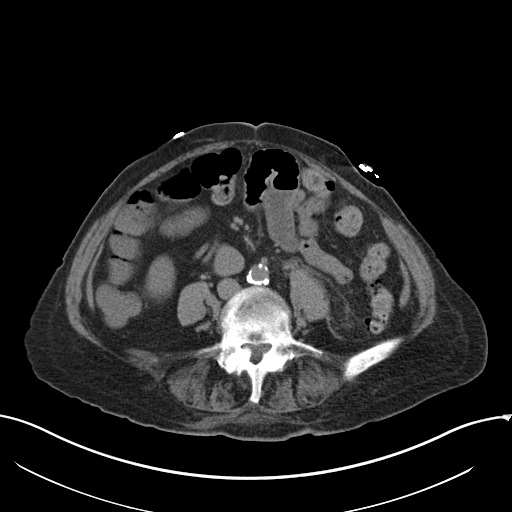
[im 58/87  soft-tissue]
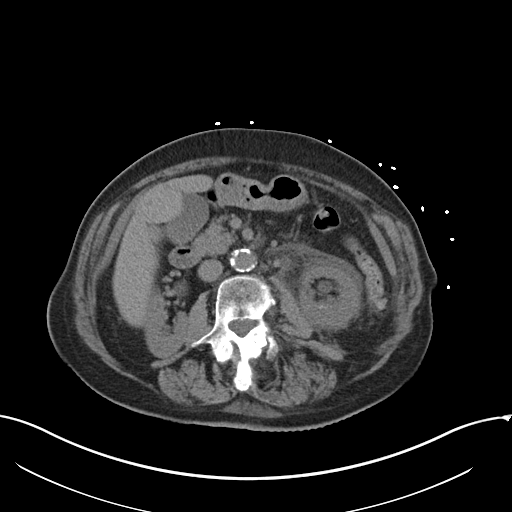
[im 58/87  bone]
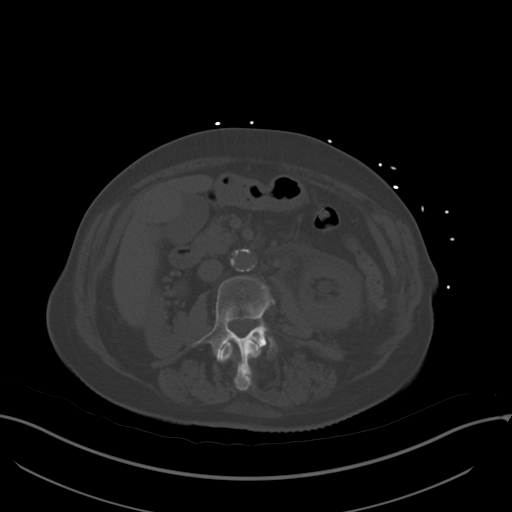
[im 63/87  soft-tissue]
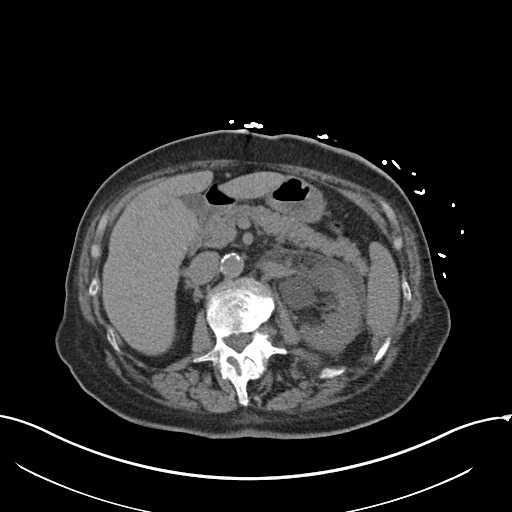
[im 67/87  soft-tissue]
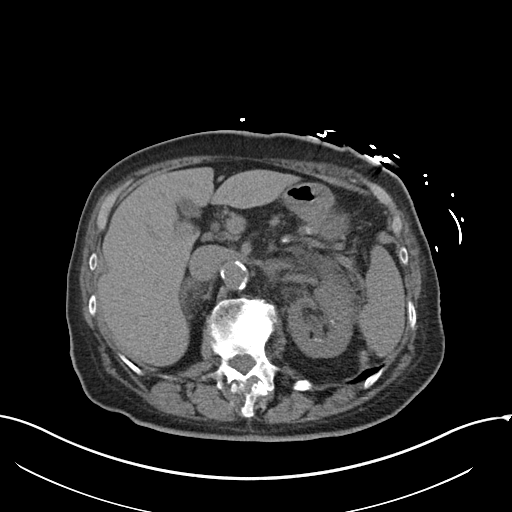
[im 77/87  soft-tissue]
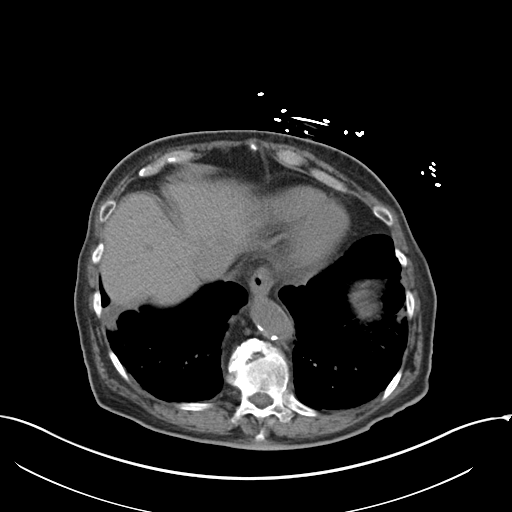
[im 82/87  soft-tissue]
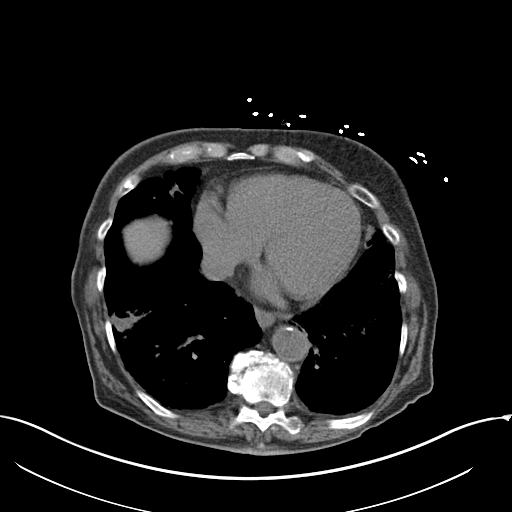

[Series 4: coronal · coronal · 0.78mm/px · 3 of 136 slices shown]
[im 46/136  soft-tissue]
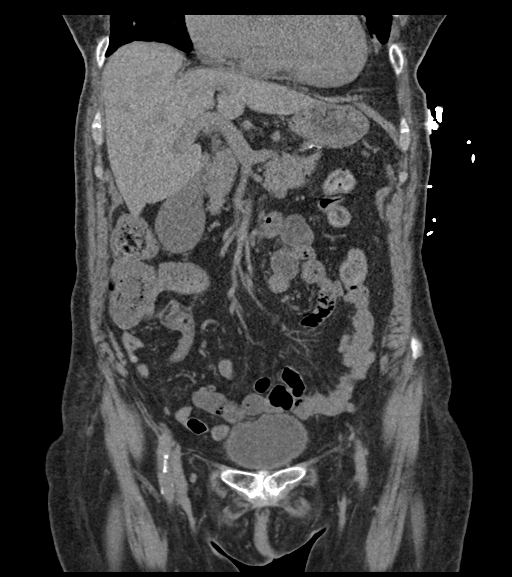
[im 61/136  soft-tissue]
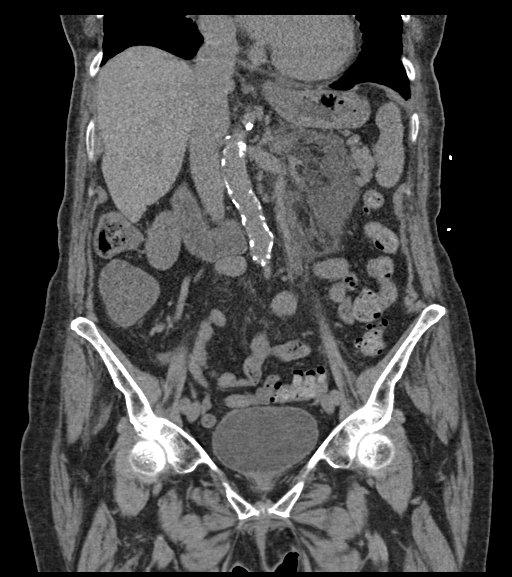
[im 76/136  soft-tissue]
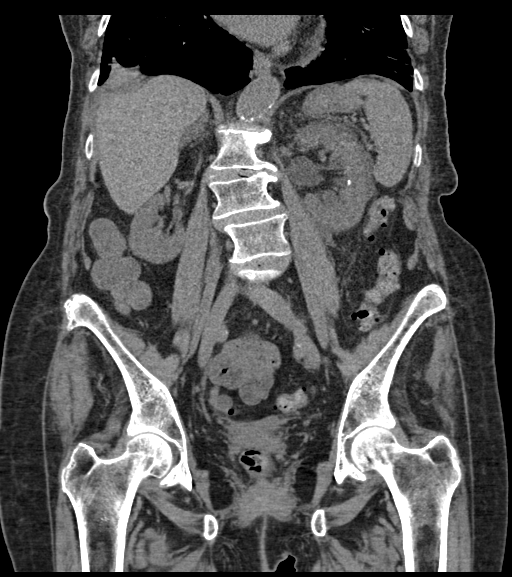

[16 of 46 positions shown; findings below may reference images not displayed]

FINDINGS: Lower chest: There is extensive bronchial wall thickening and mucus
plugging at the lung bases bilaterally. There is a masslike area of
consolidation involving the right lower lobe measuring approximately
2.4 x 2.5 cm (axial series 6, image 18). This is essentially
unchanged since prior study in July 2020 the heart is enlarged.

Hepatobiliary: The liver is normal. Cholelithiasis without acute
inflammation.There is no biliary ductal dilation.

Pancreas: Normal contours without ductal dilatation. No
peripancreatic fluid collection.

Spleen: Unremarkable.

Adrenals/Urinary Tract:

--Adrenal glands: Unremarkable.

--Right kidney/ureter: There are nonobstructing stones in lower pole
of the right kidney.

--Left kidney/ureter: There is fat stranding perinephric free fluid
about the left kidney. There are multiple punctate nonobstructing
stones involving the left kidney. There appears to be a partially
obstructing 4 mm stone in the distal left ureter (axial series 2,
image 56). There is minimal left-sided hydronephrosis.

--Urinary bladder: There is a posterior bladder diverticulum on the
left.

Stomach/Bowel:

--Stomach/Duodenum: No hiatal hernia or other gastric abnormality.
Normal duodenal course and caliber.

--Small bowel: Unremarkable.

--Colon: Rectosigmoid diverticulosis without acute inflammation.

--Appendix: Normal.

Vascular/Lymphatic: Atherosclerotic calcification is present within
the non-aneurysmal abdominal aorta, without hemodynamically
significant stenosis.

--No retroperitoneal lymphadenopathy.

--No mesenteric lymphadenopathy.

--No pelvic or inguinal lymphadenopathy.

Reproductive: Status post hysterectomy. No adnexal mass.

Other: No ascites or free air. The abdominal wall is normal.

Musculoskeletal. No acute displaced fractures.
IMPRESSION: 1. There appears to be a partially obstructing 4 mm stone in the
distal left ureter with minimal left-sided hydronephrosis.
2. Bilateral nephrolithiasis.
3. Cholelithiasis without acute inflammation.
4. Rectosigmoid diverticulosis without acute inflammation.
5. Masslike area of consolidation at the right lung base. This was
present on the patient's July 17, 2020 CT of the chest. This is
of unknown clinical significance and may represent an area of
consolidation, rounded atelectasis, or soft tissue mass. Pulmonary
medicine follow-up is recommended.

Aortic Atherosclerosis ([id number]).

## 2022-03-31 ENCOUNTER — Other Ambulatory Visit: Payer: Self-pay

## 2022-03-31 ENCOUNTER — Encounter (HOSPITAL_COMMUNITY): Payer: Self-pay

## 2022-03-31 ENCOUNTER — Emergency Department (HOSPITAL_COMMUNITY): Payer: Medicare Other

## 2022-03-31 ENCOUNTER — Emergency Department (HOSPITAL_COMMUNITY)
Admission: EM | Admit: 2022-03-31 | Discharge: 2022-03-31 | Disposition: A | Discharge status: Home / Self Care | Payer: Medicare Other | Attending: Emergency Medicine | Admitting: Emergency Medicine

## 2022-03-31 DIAGNOSIS — W19XXXA Unspecified fall, initial encounter: Secondary | ICD-10-CM | POA: Insufficient documentation

## 2022-03-31 DIAGNOSIS — M4316 Spondylolisthesis, lumbar region: Secondary | ICD-10-CM | POA: Diagnosis not present

## 2022-03-31 DIAGNOSIS — S3993XA Unspecified injury of pelvis, initial encounter: Secondary | ICD-10-CM | POA: Diagnosis not present

## 2022-03-31 DIAGNOSIS — Z7901 Long term (current) use of anticoagulants: Secondary | ICD-10-CM | POA: Insufficient documentation

## 2022-03-31 DIAGNOSIS — M25552 Pain in left hip: Secondary | ICD-10-CM | POA: Diagnosis not present

## 2022-03-31 DIAGNOSIS — M1712 Unilateral primary osteoarthritis, left knee: Secondary | ICD-10-CM | POA: Diagnosis not present

## 2022-03-31 DIAGNOSIS — M25562 Pain in left knee: Secondary | ICD-10-CM | POA: Diagnosis not present

## 2022-03-31 DIAGNOSIS — R102 Pelvic and perineal pain: Secondary | ICD-10-CM | POA: Diagnosis not present

## 2022-03-31 DIAGNOSIS — M47817 Spondylosis without myelopathy or radiculopathy, lumbosacral region: Secondary | ICD-10-CM | POA: Diagnosis not present

## 2022-03-31 DIAGNOSIS — M47816 Spondylosis without myelopathy or radiculopathy, lumbar region: Secondary | ICD-10-CM | POA: Diagnosis not present

## 2022-03-31 DIAGNOSIS — Z743 Need for continuous supervision: Secondary | ICD-10-CM | POA: Diagnosis not present

## 2022-03-31 DIAGNOSIS — S8992XA Unspecified injury of left lower leg, initial encounter: Secondary | ICD-10-CM | POA: Diagnosis not present

## 2022-03-31 DIAGNOSIS — R918 Other nonspecific abnormal finding of lung field: Secondary | ICD-10-CM | POA: Diagnosis not present

## 2022-03-31 DIAGNOSIS — M4319 Spondylolisthesis, multiple sites in spine: Secondary | ICD-10-CM | POA: Diagnosis not present

## 2022-03-31 DIAGNOSIS — Z043 Encounter for examination and observation following other accident: Secondary | ICD-10-CM | POA: Diagnosis not present

## 2022-03-31 DIAGNOSIS — I6529 Occlusion and stenosis of unspecified carotid artery: Secondary | ICD-10-CM | POA: Diagnosis not present

## 2022-03-31 DIAGNOSIS — I639 Cerebral infarction, unspecified: Secondary | ICD-10-CM | POA: Diagnosis not present

## 2022-03-31 DIAGNOSIS — M47812 Spondylosis without myelopathy or radiculopathy, cervical region: Secondary | ICD-10-CM | POA: Diagnosis not present

## 2022-03-31 DIAGNOSIS — S3992XA Unspecified injury of lower back, initial encounter: Secondary | ICD-10-CM | POA: Diagnosis not present

## 2022-03-31 LAB — BASIC METABOLIC PANEL
Anion gap: 9 (ref 5–15)
BUN: 12 mg/dL (ref 8–23)
CO2: 27 mmol/L (ref 22–32)
Calcium: 9.7 mg/dL (ref 8.9–10.3)
Chloride: 107 mmol/L (ref 98–111)
Creatinine, Ser: 0.94 mg/dL (ref 0.44–1.00)
GFR, Estimated: 58 mL/min — ABNORMAL LOW (ref >60)
Glucose, Bld: 114 mg/dL — ABNORMAL HIGH (ref 70–99)
Potassium: 4.1 mmol/L (ref 3.5–5.1)
Sodium: 143 mmol/L (ref 135–145)

## 2022-03-31 LAB — CBC WITH DIFFERENTIAL/PLATELET
Abs Immature Granulocytes: 0.06 10*3/uL (ref 0.00–0.07)
Basophils Absolute: 0.1 10*3/uL (ref 0.0–0.1)
Basophils Relative: 1 %
Eosinophils Absolute: 0.2 10*3/uL (ref 0.0–0.5)
Eosinophils Relative: 1 %
HCT: 50.2 % — ABNORMAL HIGH (ref 36.0–46.0)
Hemoglobin: 16.7 g/dL — ABNORMAL HIGH (ref 12.0–15.0)
Immature Granulocytes: 1 %
Lymphocytes Relative: 14 %
Lymphs Abs: 1.7 10*3/uL (ref 0.7–4.0)
MCH: 31.1 pg (ref 26.0–34.0)
MCHC: 33.3 g/dL (ref 30.0–36.0)
MCV: 93.5 fL (ref 80.0–100.0)
Monocytes Absolute: 0.8 10*3/uL (ref 0.1–1.0)
Monocytes Relative: 6 %
Neutro Abs: 9 10*3/uL — ABNORMAL HIGH (ref 1.7–7.7)
Neutrophils Relative %: 77 %
Platelets: 300 10*3/uL (ref 150–400)
RBC: 5.37 MIL/uL — ABNORMAL HIGH (ref 3.87–5.11)
RDW: 14.2 % (ref 11.5–15.5)
WBC: 11.7 10*3/uL — ABNORMAL HIGH (ref 4.0–10.5)
nRBC: 0 % (ref 0.0–0.2)

## 2022-03-31 MED ORDER — IOHEXOL 350 MG/ML SOLN
75.0000 mL | Freq: Once | INTRAVENOUS | Status: AC | PRN
  Administered 2022-03-31: 75 mL via INTRAVENOUS

## 2022-03-31 MED ORDER — SODIUM CHLORIDE (PF) 0.9 % IJ SOLN
INTRAMUSCULAR | Status: AC
  Filled 2022-03-31: qty 50

## 2022-03-31 NOTE — ED Notes (Signed)
I provided reinforced discharge education based off of discharge summary/care provided. Pt acknowledged and understood my education. Pt had no further questions/concerns for provider/myself.   

## 2022-03-31 NOTE — ED Provider Notes (Signed)
[facility] DEPT Provider Note   CSN: [id number] Arrival date & time: 03/31/22  1741     History  Chief Complaint  Patient presents with   Peggy Williams is a 88 y.o. female.  Patient with history of stroke, mechanical fall prior to arrival.  Some pain in the left knee.  She is on blood thinners.  Does not know she hit her head.  Did not lose consciousness.  Was ambulatory with her walker since.  Nothing has made it worse or better.  She denies any head pain, headache.  She has difficulty speaking due to her history of stroke.  History of A-fib.  The history is provided by the patient.       Home Medications Prior to Admission medications   Medication Sig Start Date End Date Taking? Authorizing Provider  apixaban (ELIQUIS) 5 MG TABS tablet Take 1 tablet (5 mg total) by mouth 2 (two) times daily. 08/02/21   Freada Bergeron, MD  digoxin (LANOXIN) 0.125 MG tablet Take 1 tablet (0.125 mg total) by mouth daily. Please make overdue appt with Dr. Johney Frame before anymore refills. Thank you 1st attempt 01/06/22   Freada Bergeron, MD  diltiazem (CARDIZEM CD) 240 MG 24 hr capsule Take 1 capsule (240 mg total) by mouth daily. Pt. Needs to make an overdue appt. With Cardiologist in order to receive future refills. Thank You. 2nd Attempt. 03/02/22   Freada Bergeron, MD  furosemide (LASIX) 20 MG tablet Take 1 tablet (20 mg total) by mouth daily. 12/09/20   Freada Bergeron, MD  levothyroxine (SYNTHROID, LEVOTHROID) 50 MCG tablet Take 50 mcg by mouth daily before breakfast.    [provider]  metoprolol succinate (TOPROL-XL) 25 MG 24 hr tablet Take 0.5 tablets (12.5 mg total) by mouth in the morning and at bedtime. Please make overdue appt with Dr. Johney Frame Cardiologist before anymore refills. Thank you 3rd and Final Attempt 01/24/22   Freada Bergeron, MD  Multiple Vitamins-Minerals [facility] ADULT 50+) CAPS Take 1 capsule by mouth  daily in the afternoon.    [provider]  ondansetron (ZOFRAN) 4 MG tablet Take 1 tablet (4 mg total) by mouth every 6 (six) hours. 12/13/20   Arnaldo Natal, MD  potassium chloride (KLOR-CON) 10 MEQ tablet Take one (1) tablet by mouth (10 meq) daily. 3RD FINAL   ATTEMPT. PT IS OVERDUE FOR AN APPT. CAN OK 30 DAYS SUPPLY. PLEASE HAVE PT CALL TO SCHEDULE appointment. 02/07/22   Freada Bergeron, MD  Vitamin D-Vitamin K (VITAMIN K2-VITAMIN D3) 45-2000 MCG-UNIT CAPS Take 1 capsule by mouth daily in the afternoon.    [provider]      Allergies    Patient has no known allergies.    Review of Systems   Review of Systems  Physical Exam Updated Vital Signs  ED Triage Vitals [03/31/22 1804]  Enc Vitals Group     BP (!) 150/120     Pulse Rate 89     Resp (!) 21     Temp 97.7 F (36.5 C)     Temp Source Oral     SpO2 95 %     Weight      Height      Head Circumference      Peak Flow      Pain Score      Pain Loc      Pain Edu?  Excl. in [place]?     Physical Exam Vitals and nursing note reviewed.  Constitutional:      General: She is not in acute distress.    Appearance: She is well-developed. She is not ill-appearing.  HENT:     Head: Normocephalic and atraumatic.     Nose: Nose normal.     Mouth/Throat:     Mouth: Mucous membranes are moist.  Eyes:     Extraocular Movements: Extraocular movements intact.     Conjunctiva/sclera: Conjunctivae normal.     Pupils: Pupils are equal, round, and reactive to light.  Cardiovascular:     Rate and Rhythm: Normal rate and regular rhythm.     Pulses: Normal pulses.     Heart sounds: Normal heart sounds. No murmur heard. Pulmonary:     Effort: Pulmonary effort is normal. No respiratory distress.     Breath sounds: Normal breath sounds.  Abdominal:     Palpations: Abdomen is soft.     Tenderness: There is no abdominal tenderness.  Musculoskeletal:        General: Tenderness (left knee) present. No swelling.      Cervical back: Normal range of motion and neck supple. No tenderness.  Skin:    General: Skin is warm and dry.     Capillary Refill: Capillary refill takes less than 2 seconds.  Neurological:     General: No focal deficit present.     Mental Status: She is alert.  Psychiatric:        Mood and Affect: Mood normal.     ED Results / Procedures / Treatments   Labs (all labs ordered are listed, but only abnormal results are displayed) Labs Reviewed  CBC WITH DIFFERENTIAL/PLATELET - Abnormal; Notable for the following components:      Result Value   WBC 11.7 (*)    RBC 5.37 (*)    Hemoglobin 16.7 (*)    HCT 50.2 (*)    Neutro Abs 9.0 (*)    All other components within normal limits  BASIC METABOLIC PANEL - Abnormal; Notable for the following components:   Glucose, Bld 114 (*)    GFR, Estimated 58 (*)    All other components within normal limits    EKG None  Radiology CT Angio Head W or Wo Contrast  Result Date: 03/31/2022 CLINICAL DATA:  Treated cerebral aneurysm EXAM: CT ANGIOGRAPHY HEAD TECHNIQUE: Multidetector CT imaging of the head was performed using the standard protocol during bolus administration of intravenous contrast. Multiplanar CT image reconstructions and MIPs were obtained to evaluate the vascular anatomy. RADIATION DOSE REDUCTION: This exam was performed according to the departmental dose-optimization program which includes automated exposure control, adjustment of the mA and/or kV according to patient size and/or use of iterative reconstruction technique. CONTRAST:  30m OMNIPAQUE IOHEXOL 350 MG/ML SOLN COMPARISON:  None Available. FINDINGS: POSTERIOR CIRCULATION: --Vertebral arteries: Normal --Inferior cerebellar arteries: Normal. --Basilar artery: Normal. --Superior cerebellar arteries: Normal. --Posterior cerebral arteries: Normal. ANTERIOR CIRCULATION: --Intracranial internal carotid arteries: Normal. --Anterior cerebral arteries (ACA): Normal. --Middle  cerebral arteries (MCA): There is a clipped left MCA aneurysm with masslike surrounding hyperdensity. This likely indicates the thrombosed aneurysm sac, within which there is no residual filling. There is beading of the proximal left M2 segments which remain clearly patent. The right MCA is normal. ANATOMIC VARIANTS: None Review of the MIP images confirms the above findings. IMPRESSION: 1. Status post clipped left MCA aneurysm with masslike surrounding hyperdensity, likely the thrombosed aneurysm sac, within which  there is no residual filling. Electronically Signed   By: Ulyses Jarred M.D.   On: 03/31/2022 20:50   DG Chest Portable 1 View  Result Date: 03/31/2022 CLINICAL DATA:  Repeat radiograph EXAM: PORTABLE CHEST 1 VIEW COMPARISON:  03/31/2022 6:48 p.m. FINDINGS: Cardiac contour the upper limit of normal. Aortic atherosclerosis. Mild left basilar opacities. No pleural effusion or pneumothorax. No acute osseous abnormality. IMPRESSION: Mild left basilar opacities, which are nonspecific but may represent atelectasis or infection. Electronically Signed   By: Merilyn Baba M.D.   On: 03/31/2022 19:18   DG Chest 2 View  Result Date: 03/31/2022 CLINICAL DATA:  Provided history: Fall. EXAM: CHEST - 2 VIEW COMPARISON:  Prior chest radiographs 09/25/2020 and earlier. FINDINGS: Heart size at the upper limits of normal. Aortic atherosclerosis. A vertically oriented linear interface projects in the region of the right lung (see annotations on image). Lung markings appear to be present peripheral to this interface. Mild ill-defined opacities within the left mid lung field and lateral left lung base. No evidence of pleural effusion. Spondylosis and levocurvature of the thoracic spine. Chronic fracture deformities of the posterior right fourth and fifth ribs. Impression #1 was called by telephone at the time of interpretation on 03/31/2022 at 7:00 pm to provider Candee Hoon , who verbally acknowledged these results.  IMPRESSION: A vertically oriented linear interface projects in the region of the right lung. Lung markings appear to be present peripheral to this interface and this may reflect a skin fold. However, a repeat AP chest radiograph is recommended to exclude a pneumothorax. Mild ill-defined opacities within the left mid lung and lateral left lung base. Findings may reflect atelectasis, scarring or infection. Aortic Atherosclerosis (ICD10-I70.0). Electronically Signed   By: Kellie Simmering D.O.   On: 03/31/2022 19:01   DG Lumbar Spine Complete  Result Date: 03/31/2022 CLINICAL DATA:  Trauma, fall EXAM: LUMBAR SPINE - COMPLETE 4+ VIEW COMPARISON:  07/17/2020 FINDINGS: No recent fracture is seen. There is mild retrolisthesis at L2-L3 level and mild anterolisthesis at L3-L4 level. There is mild anterolisthesis at the L4-L5 level. Degenerative changes are noted with bony spurs and facet hypertrophy, more severe at L5-S1 level. IMPRESSION: No recent fracture is seen in the lumbar spine. Lumbar spondylosis with bony spurs, disc space narrowing and facet hypertrophy. There is minimal retrolisthesis at L2-L3 level and mild anterolisthesis at L3-L4 and L4-L5 levels. Electronically Signed   By: Elmer Picker M.D.   On: 03/31/2022 18:58   DG Knee Complete 4 Views Left  Result Date: 03/31/2022 CLINICAL DATA:  Trauma, fall EXAM: LEFT KNEE - COMPLETE 4+ VIEW COMPARISON:  None Available. FINDINGS: No fracture or dislocation is seen. There is no significant effusion in suprapatellar bursa. Degenerative changes are noted with joint space narrowing and bony spurs in the medial compartment. Minimal bony spurs are seen in the patella. IMPRESSION: No recent fracture is seen in the left knee. Degenerative changes are noted, most severe in the medial compartment. Electronically Signed   By: Elmer Picker M.D.   On: 03/31/2022 18:56   DG Pelvis 1-2 Views  Result Date: 03/31/2022 CLINICAL DATA:  Trauma, fall EXAM: PELVIS -  1-2 VIEW COMPARISON:  None Available. FINDINGS: No fracture is seen. Joint spaces in both hips appear symmetrical. SI joints are unremarkable. IMPRESSION: No fracture is seen in pelvis. Electronically Signed   By: Elmer Picker M.D.   On: 03/31/2022 18:54   CT HEAD WO CONTRAST (5MM)  Result Date: 03/31/2022 CLINICAL DATA:  Fall EXAM: CT HEAD WITHOUT CONTRAST CT CERVICAL SPINE WITHOUT CONTRAST TECHNIQUE: Multidetector CT imaging of the head and cervical spine was performed following the standard protocol without intravenous contrast. Multiplanar CT image reconstructions of the cervical spine were also generated. RADIATION DOSE REDUCTION: This exam was performed according to the departmental dose-optimization program which includes automated exposure control, adjustment of the mA and/or kV according to patient size and/or use of iterative reconstruction technique. COMPARISON:  CT brain and cervical spine 01/14/2021 FINDINGS: CT HEAD FINDINGS Brain: No acute territorial infarction or hemorrhage is visualized. Extensive encephalomalacia involving the left frontal, parietal, and temporal lobes. Small chronic cerebellar infarcts. Hyperdensity within the left basal ganglia region, also seen to the left of the cavernous sinus, this measures approximately 2.8 by 2.8 by 1.9 cm. Suspicion of mild local mass effect with slight bowing of the septum to the right. The ventricles are stable in size. There is chronic small vessel ischemic changes of the white matter and advanced atrophy. Vascular: Status post left aneurysm clipping. Carotid vascular calcification. Skull: Status post left craniotomy.  No fracture Sinuses/Orbits: Fluid level in the right maxillary sinus Other: None CT CERVICAL SPINE FINDINGS Alignment: Trace anterolisthesis C4 on C5. Trace retrolisthesis C5 on C6. Trace anterolisthesis C7 on T1. Facet alignment within normal limits Skull base and vertebrae: No acute fracture. No primary bone lesion or focal  pathologic process. Soft tissues and spinal canal: No prevertebral fluid or swelling. No visible canal hematoma. Disc levels: Multilevel degenerative change. Moderate severe disc space narrowing at C3-C4, C5-C6 and C6-C7. Facet degenerative changes at multiple levels with foraminal stenosis. Upper chest: Negative. Other: None IMPRESSION: 1. Negative for acute intracranial hemorrhage. Patient is status post left aneurysm clipping. Findings are suspicious for a slightly hyperdense mass versus recurrent aneurysm in the region of the left basal ganglia/parasellar region, near the aneurysm clip. Recommend further assessment with CT angiography. 2. Atrophy and chronic small vessel ischemic changes of the white matter. Large left hemispheric infarct with small chronic cerebellar infarcts 3. Multilevel degenerative changes of the cervical spine. No acute osseous abnormality Electronically Signed   By: Donavan Foil M.D.   On: 03/31/2022 18:53   CT Cervical Spine Wo Contrast  Result Date: 03/31/2022 CLINICAL DATA:  Fall EXAM: CT HEAD WITHOUT CONTRAST CT CERVICAL SPINE WITHOUT CONTRAST TECHNIQUE: Multidetector CT imaging of the head and cervical spine was performed following the standard protocol without intravenous contrast. Multiplanar CT image reconstructions of the cervical spine were also generated. RADIATION DOSE REDUCTION: This exam was performed according to the departmental dose-optimization program which includes automated exposure control, adjustment of the mA and/or kV according to patient size and/or use of iterative reconstruction technique. COMPARISON:  CT brain and cervical spine 01/14/2021 FINDINGS: CT HEAD FINDINGS Brain: No acute territorial infarction or hemorrhage is visualized. Extensive encephalomalacia involving the left frontal, parietal, and temporal lobes. Small chronic cerebellar infarcts. Hyperdensity within the left basal ganglia region, also seen to the left of the cavernous sinus, this  measures approximately 2.8 by 2.8 by 1.9 cm. Suspicion of mild local mass effect with slight bowing of the septum to the right. The ventricles are stable in size. There is chronic small vessel ischemic changes of the white matter and advanced atrophy. Vascular: Status post left aneurysm clipping. Carotid vascular calcification. Skull: Status post left craniotomy.  No fracture Sinuses/Orbits: Fluid level in the right maxillary sinus Other: None CT CERVICAL SPINE FINDINGS Alignment: Trace anterolisthesis C4 on C5. Trace retrolisthesis C5 on C6. Trace  anterolisthesis C7 on T1. Facet alignment within normal limits Skull base and vertebrae: No acute fracture. No primary bone lesion or focal pathologic process. Soft tissues and spinal canal: No prevertebral fluid or swelling. No visible canal hematoma. Disc levels: Multilevel degenerative change. Moderate severe disc space narrowing at C3-C4, C5-C6 and C6-C7. Facet degenerative changes at multiple levels with foraminal stenosis. Upper chest: Negative. Other: None IMPRESSION: 1. Negative for acute intracranial hemorrhage. Patient is status post left aneurysm clipping. Findings are suspicious for a slightly hyperdense mass versus recurrent aneurysm in the region of the left basal ganglia/parasellar region, near the aneurysm clip. Recommend further assessment with CT angiography. 2. Atrophy and chronic small vessel ischemic changes of the white matter. Large left hemispheric infarct with small chronic cerebellar infarcts 3. Multilevel degenerative changes of the cervical spine. No acute osseous abnormality Electronically Signed   By: Donavan Foil M.D.   On: 03/31/2022 18:53    Procedures Procedures    Medications Ordered in ED Medications  sodium chloride (PF) 0.9 % injection (has no administration in time range)  iohexol (OMNIPAQUE) 350 MG/ML injection 75 mL (75 mLs Intravenous Contrast Given 03/31/22 2018)    ED Course/ Medical Decision Making/ A&P                            Medical Decision Making Amount and/or Complexity of Data Reviewed Labs: ordered. Radiology: ordered.  Risk Prescription drug management.   OZELL FERRERA is here after fall.  History of cerebral aneurysm status post clipping, A-fib on Eliquis.  Patient with unremarkable vitals.  No fever.  Sounds like a mechanical fall.  She is walking with her walker and tripped on her door frame.  Patient has stroke history with speech difficulty ever since.  Somewhat difficult to get history from.  Family member states that this is what occurred today.  She has had some left knee pain.  She is able to ambulate with her walker afterwards.  Was sent here from her independent living for evaluation.  We will get CT head, neck, chest x-ray, pelvic x-ray, left knee x-ray.  X-ray I show no acute fracture, no pneumothorax, no injuries.  CT neck is unremarkable.  CT of the head shows that patient is post left aneurysm clipping however findings are suspicious for hyperdense mass versus recurrent aneurysm in the region of the left basal ganglia/parasellar region near the aneurysm clip.  I talked with Dr. Fuller Plan with neurology who does recommend a CT angio to further evaluate.  Given that this aneurysm clip was placed a long time ago it is likely not MRI compatible.  Overall, CTA showed status post clipped left MCA aneurysm with masslike surrounding hyperdensity likely the thrombosed aneurysm sac within which there is no residual filling.  This was reviewed by Dr. Venetia Constable with neurosurgery.  This is a normal finding.  Patient can be discharged.  This chart was dictated using voice recognition software.  Despite best efforts to proofread,  errors can occur which can change the documentation meaning.         Final Clinical Impression(s) / ED Diagnoses Final diagnoses:  Fall, initial encounter    Rx / DC Orders ED Discharge Orders     None         Lennice Sites, DO 03/31/22 2131

## 2022-03-31 NOTE — ED Notes (Signed)
Patient returned from CT

## 2022-03-31 NOTE — ED Triage Notes (Addendum)
Pt BIB EMS from [facility]. Pt fell this morning and was able to pull herself up. Pt reports she just tripped and fell. Pt endorses left hip pain. Pt denies hitting her head. Pt takes Eliquis. Pt is normally ambulatory without assistance with walker. Pt has speaking difficulty due to prior stroke A&O x4.   160/90 HR 116

## 2022-04-18 DIAGNOSIS — R296 Repeated falls: Secondary | ICD-10-CM | POA: Diagnosis not present

## 2022-04-18 DIAGNOSIS — H9193 Unspecified hearing loss, bilateral: Secondary | ICD-10-CM | POA: Diagnosis not present

## 2022-04-18 DIAGNOSIS — R2689 Other abnormalities of gait and mobility: Secondary | ICD-10-CM | POA: Diagnosis not present

## 2022-04-18 DIAGNOSIS — R2681 Unsteadiness on feet: Secondary | ICD-10-CM | POA: Diagnosis not present

## 2022-04-18 DIAGNOSIS — H6123 Impacted cerumen, bilateral: Secondary | ICD-10-CM | POA: Diagnosis not present

## 2022-04-20 DIAGNOSIS — R296 Repeated falls: Secondary | ICD-10-CM | POA: Diagnosis not present

## 2022-04-20 DIAGNOSIS — R2681 Unsteadiness on feet: Secondary | ICD-10-CM | POA: Diagnosis not present

## 2022-04-20 DIAGNOSIS — R2689 Other abnormalities of gait and mobility: Secondary | ICD-10-CM | POA: Diagnosis not present

## 2022-04-25 DIAGNOSIS — R296 Repeated falls: Secondary | ICD-10-CM | POA: Diagnosis not present

## 2022-04-25 DIAGNOSIS — R2681 Unsteadiness on feet: Secondary | ICD-10-CM | POA: Diagnosis not present

## 2022-04-25 DIAGNOSIS — R2689 Other abnormalities of gait and mobility: Secondary | ICD-10-CM | POA: Diagnosis not present

## 2022-04-27 DIAGNOSIS — R296 Repeated falls: Secondary | ICD-10-CM | POA: Diagnosis not present

## 2022-04-27 DIAGNOSIS — R2689 Other abnormalities of gait and mobility: Secondary | ICD-10-CM | POA: Diagnosis not present

## 2022-04-27 DIAGNOSIS — R2681 Unsteadiness on feet: Secondary | ICD-10-CM | POA: Diagnosis not present

## 2022-05-02 DIAGNOSIS — R2689 Other abnormalities of gait and mobility: Secondary | ICD-10-CM | POA: Diagnosis not present

## 2022-05-02 DIAGNOSIS — R296 Repeated falls: Secondary | ICD-10-CM | POA: Diagnosis not present

## 2022-05-02 DIAGNOSIS — R2681 Unsteadiness on feet: Secondary | ICD-10-CM | POA: Diagnosis not present

## 2022-05-23 DIAGNOSIS — H353113 Nonexudative age-related macular degeneration, right eye, advanced atrophic without subfoveal involvement: Secondary | ICD-10-CM | POA: Diagnosis not present

## 2022-05-23 DIAGNOSIS — H43813 Vitreous degeneration, bilateral: Secondary | ICD-10-CM | POA: Diagnosis not present

## 2022-05-23 DIAGNOSIS — H353221 Exudative age-related macular degeneration, left eye, with active choroidal neovascularization: Secondary | ICD-10-CM | POA: Diagnosis not present

## 2022-07-03 DIAGNOSIS — R262 Difficulty in walking, not elsewhere classified: Secondary | ICD-10-CM | POA: Diagnosis not present

## 2022-07-03 DIAGNOSIS — R2689 Other abnormalities of gait and mobility: Secondary | ICD-10-CM | POA: Diagnosis not present

## 2022-07-11 DIAGNOSIS — I6789 Other cerebrovascular disease: Secondary | ICD-10-CM | POA: Diagnosis not present

## 2022-07-11 DIAGNOSIS — R2681 Unsteadiness on feet: Secondary | ICD-10-CM | POA: Diagnosis not present

## 2022-07-11 DIAGNOSIS — R2689 Other abnormalities of gait and mobility: Secondary | ICD-10-CM | POA: Diagnosis not present

## 2022-07-14 DIAGNOSIS — R2689 Other abnormalities of gait and mobility: Secondary | ICD-10-CM | POA: Diagnosis not present

## 2022-07-14 DIAGNOSIS — R2681 Unsteadiness on feet: Secondary | ICD-10-CM | POA: Diagnosis not present

## 2022-07-14 DIAGNOSIS — I6789 Other cerebrovascular disease: Secondary | ICD-10-CM | POA: Diagnosis not present

## 2022-07-18 DIAGNOSIS — R2689 Other abnormalities of gait and mobility: Secondary | ICD-10-CM | POA: Diagnosis not present

## 2022-07-18 DIAGNOSIS — I6789 Other cerebrovascular disease: Secondary | ICD-10-CM | POA: Diagnosis not present

## 2022-07-18 DIAGNOSIS — R2681 Unsteadiness on feet: Secondary | ICD-10-CM | POA: Diagnosis not present

## 2022-07-20 DIAGNOSIS — R2681 Unsteadiness on feet: Secondary | ICD-10-CM | POA: Diagnosis not present

## 2022-07-20 DIAGNOSIS — I6789 Other cerebrovascular disease: Secondary | ICD-10-CM | POA: Diagnosis not present

## 2022-07-20 DIAGNOSIS — R2689 Other abnormalities of gait and mobility: Secondary | ICD-10-CM | POA: Diagnosis not present

## 2022-07-24 DIAGNOSIS — H903 Sensorineural hearing loss, bilateral: Secondary | ICD-10-CM | POA: Diagnosis not present

## 2022-07-25 DIAGNOSIS — R2681 Unsteadiness on feet: Secondary | ICD-10-CM | POA: Diagnosis not present

## 2022-07-25 DIAGNOSIS — R2689 Other abnormalities of gait and mobility: Secondary | ICD-10-CM | POA: Diagnosis not present

## 2022-07-25 DIAGNOSIS — I6789 Other cerebrovascular disease: Secondary | ICD-10-CM | POA: Diagnosis not present

## 2022-07-27 ENCOUNTER — Other Ambulatory Visit: Payer: Self-pay | Admitting: Internal Medicine

## 2022-07-27 ENCOUNTER — Encounter: Payer: Self-pay | Admitting: Internal Medicine

## 2022-07-27 ENCOUNTER — Ambulatory Visit (INDEPENDENT_AMBULATORY_CARE_PROVIDER_SITE_OTHER): Payer: Medicare Other | Admitting: Internal Medicine

## 2022-07-27 VITALS — BP 118/76 | HR 104 | Temp 98.0°F | Resp 18 | Ht 64.0 in | Wt 141.8 lb

## 2022-07-27 DIAGNOSIS — R2689 Other abnormalities of gait and mobility: Secondary | ICD-10-CM | POA: Diagnosis not present

## 2022-07-27 DIAGNOSIS — E039 Hypothyroidism, unspecified: Secondary | ICD-10-CM | POA: Diagnosis not present

## 2022-07-27 DIAGNOSIS — Z0000 Encounter for general adult medical examination without abnormal findings: Secondary | ICD-10-CM | POA: Diagnosis not present

## 2022-07-27 DIAGNOSIS — M170 Bilateral primary osteoarthritis of knee: Secondary | ICD-10-CM | POA: Diagnosis not present

## 2022-07-27 DIAGNOSIS — I679 Cerebrovascular disease, unspecified: Secondary | ICD-10-CM | POA: Insufficient documentation

## 2022-07-27 DIAGNOSIS — R471 Dysarthria and anarthria: Secondary | ICD-10-CM | POA: Diagnosis not present

## 2022-07-27 DIAGNOSIS — Z6824 Body mass index (BMI) 24.0-24.9, adult: Secondary | ICD-10-CM | POA: Diagnosis not present

## 2022-07-27 DIAGNOSIS — I4891 Unspecified atrial fibrillation: Secondary | ICD-10-CM | POA: Diagnosis not present

## 2022-07-27 DIAGNOSIS — R739 Hyperglycemia, unspecified: Secondary | ICD-10-CM | POA: Insufficient documentation

## 2022-07-27 DIAGNOSIS — I6789 Other cerebrovascular disease: Secondary | ICD-10-CM | POA: Diagnosis not present

## 2022-07-27 DIAGNOSIS — N183 Chronic kidney disease, stage 3 unspecified: Secondary | ICD-10-CM

## 2022-07-27 DIAGNOSIS — E7800 Pure hypercholesterolemia, unspecified: Secondary | ICD-10-CM | POA: Insufficient documentation

## 2022-07-27 DIAGNOSIS — R2681 Unsteadiness on feet: Secondary | ICD-10-CM | POA: Diagnosis not present

## 2022-07-27 NOTE — Assessment & Plan Note (Signed)
She was taken off statins in her 80's and it sounds like she is not interested in this medication.  We will check a FLP today.

## 2022-07-27 NOTE — Assessment & Plan Note (Signed)
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  She has resultant aphasia.  We will continue risk factor modifcation.

## 2022-07-27 NOTE — Assessment & Plan Note (Signed)
She can use tylenol as needed for any arthritic pains.

## 2022-07-27 NOTE — Assessment & Plan Note (Signed)
She seems euthryoid.  We will check her TFT's today.

## 2022-07-27 NOTE — Progress Notes (Signed)
Subjective:    Peggy Williams is a 88 y.o. female who presents for Medicare Annual/Subsequent preventive examination.  She is currently in independent living with her home at the [facility] facility.  She is transitioning to the assisted living this coming week.  She has been here at [facility] for 9 years.   Her last full dilated eye exam was 2 months ago.  She currently has senosorineural hearing loss about 7 years ago and uses a hearing aid. They are following her for macular degeneration where she is getting eye injections where she can see colors/lights out of her right eye.  Her vision has been stable since her injections.  Her last colonoscopy was done maybe 20 years ago and was normal per the patient.  Her last mammogram was done maybe 20 years ago and was normal per the patient. She had a partial hystrectomy in her 28's. She does keep active.  She does get yearly flu vaccines.  She states she had the pneumovax 23 in 2011 and the  prevnar 13 vaccine in 2015.  She had a zostavax shingles vaccine in 2007.  She has not had a shingrix vaccine.  She is interested in the RSV vaccine.  She has had 6 COVID-19 vaccines including 4 boosters but she not had the new bivalent vaccine from this fall.  Her mother had CAD and died in her 75's from a MI.  She is on eliquis '5mg'$  BID.  The patient also has a history of paroxymsal Atrial fibrillation which was diagnosed incidentally in 07/2020.  She was seen in the ER in 2021 where she underwent DCCV at that time but went back into A. Fib.  She was symptomatic with fatigue.  Today, she is rated controlled with  and metoprolol and remains on digoxin at this time.  She is on eliquis and denies any hemoptysis, BRBPR, melena or other problems.  She denies any exertional chest pain, SOB, diaphroesis, palpitations, syncope, generalized weakness or peripheral edema.    She also has a history of hypothyroidism which was diagnosed at least 30 years ago.  She was found to be  hypothyroid on routine lab work.  She is currently on levothyroxine 50mg po daily.  Today, she denies any heat or cold intolerance, no unexpected weight loss or weight gain, tremors, anxiety, insomnia, diarreha, constipation but she does have some dry skin.  The patient has a history of cerebrovascular which occurred postoperatively in 1987 where they were doing clipping for a berry aneurysm.  She postoperative vasospam with resultant stroke which she had right side weakness and resultant aphasia.  She went to rehab but did not return to baseline with her right weakness but this did improve.  She still has aphasia today but is easy to understand.  She had no stroke sequalae of vision or memory loss.  Before she had her A. Fib, she was on an ASA.  When turned [id number], they did stop her cholesterol medications as well.  She also has a previous note from her PCP from 6-7 years ago stating she has CKD.  She states she does not use NSAIDS.  The patient does have osteoarthritis of her bilateral knees and her back.  She had a right TKA done several years ago.  She states she uses tylenol as needed.  She did smoke and quit smoking about 5 years ago.  She was smoking 1 ppd.  She quit as she was having some chronic cough which is gone.  Her son tells me she has some right lower lung scarring.  She started smoking in her early 20's and smoked for a number years but quit for about 20 years.   Preventive Screening-Counseling & Management  Tobacco Social History   Tobacco Use  Smoking Status Former   Packs/day: 1.00   Years: 45.00   Total pack years: 45.00   Types: Cigarettes   Quit date: 2019   Years since quitting: 4.8  Smokeless Tobacco Never     Problems Prior to Visit 1.   Current Problems (verified) Patient Active Problem List   Diagnosis Date Noted   New onset atrial flutter ([place]) 07/17/2020   History of CVA (cerebrovascular accident) 07/17/2020   Shingles 07/17/2020   DNR (do not  resuscitate) 07/17/2020    Medications Prior to Visit Current Outpatient Medications on File Prior to Visit  Medication Sig Dispense Refill   apixaban (ELIQUIS) 5 MG TABS tablet Take 1 tablet (5 mg total) by mouth 2 (two) times daily. 180 tablet 1   digoxin (LANOXIN) 0.125 MG tablet Take 1 tablet (0.125 mg total) by mouth daily. Please make overdue appt with Dr. Johney Frame before anymore refills. Thank you 1st attempt 30 tablet 0   diltiazem (CARDIZEM CD) 240 MG 24 hr capsule Take 1 capsule (240 mg total) by mouth daily. Pt. Needs to make an overdue appt. With Cardiologist in order to receive future refills. Thank You. 2nd Attempt. 15 capsule 0   levothyroxine (SYNTHROID, LEVOTHROID) 50 MCG tablet Take 50 mcg by mouth daily before breakfast.     metoprolol succinate (TOPROL-XL) 25 MG 24 hr tablet Take 0.5 tablets (12.5 mg total) by mouth in the morning and at bedtime. Please make overdue appt with Dr. Johney Frame Cardiologist before anymore refills. Thank you 3rd and Final Attempt 15 tablet 0   Multiple Vitamins-Minerals (OCUVITE ADULT 50+) CAPS Take 1 capsule by mouth daily in the afternoon.     ondansetron (ZOFRAN) 4 MG tablet Take 1 tablet (4 mg total) by mouth every 6 (six) hours. 12 tablet 0   Vitamin D-Vitamin K (VITAMIN K2-VITAMIN D3) 45-2000 MCG-UNIT CAPS Take 1 capsule by mouth daily in the afternoon.     furosemide (LASIX) 20 MG tablet Take 1 tablet (20 mg total) by mouth daily. (Patient not taking: Reported on 07/27/2022) 90 tablet 3   potassium chloride (KLOR-CON) 10 MEQ tablet Take one (1) tablet by mouth (10 meq) daily. 3RD FINAL   ATTEMPT. PT IS OVERDUE FOR AN APPT. CAN OK 30 DAYS SUPPLY. PLEASE HAVE PT CALL TO SCHEDULE appointment. (Patient not taking: Reported on 07/27/2022) 15 tablet 0   No current facility-administered medications on file prior to visit.    Current Medications (verified) Current Outpatient Medications  Medication Sig Dispense Refill   apixaban (ELIQUIS) 5 MG  TABS tablet Take 1 tablet (5 mg total) by mouth 2 (two) times daily. 180 tablet 1   digoxin (LANOXIN) 0.125 MG tablet Take 1 tablet (0.125 mg total) by mouth daily. Please make overdue appt with Dr. Johney Frame before anymore refills. Thank you 1st attempt 30 tablet 0   diltiazem (CARDIZEM CD) 240 MG 24 hr capsule Take 1 capsule (240 mg total) by mouth daily. Pt. Needs to make an overdue appt. With Cardiologist in order to receive future refills. Thank You. 2nd Attempt. 15 capsule 0   levothyroxine (SYNTHROID, LEVOTHROID) 50 MCG tablet Take 50 mcg by mouth daily before breakfast.     metoprolol succinate (TOPROL-XL) 25 MG 24 hr tablet  Take 0.5 tablets (12.5 mg total) by mouth in the morning and at bedtime. Please make overdue appt with Dr. Johney Frame Cardiologist before anymore refills. Thank you 3rd and Final Attempt 15 tablet 0   Multiple Vitamins-Minerals (OCUVITE ADULT 50+) CAPS Take 1 capsule by mouth daily in the afternoon.     ondansetron (ZOFRAN) 4 MG tablet Take 1 tablet (4 mg total) by mouth every 6 (six) hours. 12 tablet 0   Vitamin D-Vitamin K (VITAMIN K2-VITAMIN D3) 45-2000 MCG-UNIT CAPS Take 1 capsule by mouth daily in the afternoon.     furosemide (LASIX) 20 MG tablet Take 1 tablet (20 mg total) by mouth daily. (Patient not taking: Reported on 07/27/2022) 90 tablet 3   potassium chloride (KLOR-CON) 10 MEQ tablet Take one (1) tablet by mouth (10 meq) daily. 3RD FINAL   ATTEMPT. PT IS OVERDUE FOR AN APPT. CAN OK 30 DAYS SUPPLY. PLEASE HAVE PT CALL TO SCHEDULE appointment. (Patient not taking: Reported on 07/27/2022) 15 tablet 0   No current facility-administered medications for this visit.     Allergies (verified) Patient has no known allergies.   PAST HISTORY  Family History No family history on file.  Social History Social History   Tobacco Use   Smoking status: Former    Packs/day: 1.00    Years: 45.00    Total pack years: 45.00    Types: Cigarettes    Quit date: 2019     Years since quitting: 4.8   Smokeless tobacco: Never  Substance Use Topics   Alcohol use: Yes     Are there smokers in your home (other than you)? No  Risk Factors Current exercise habits: Home exercise routine includes walks with rolling walker.  Dietary issues discussed: none   Cardiac risk factors: advanced age (older than 106 for men, 89 for women), dyslipidemia, and family history of premature cardiovascular disease.  Depression Screen (Note: if answer to either of the following is "Yes", a more complete depression screening is indicated)   Over the past two weeks, have you felt down, depressed or hopeless? No  Over the past two weeks, have you felt little interest or pleasure in doing things? No  Have you lost interest or pleasure in daily life? No  Do you often feel hopeless? No  Do you cry easily over simple problems? No  Activities of Daily Living In your present state of health, do you have any difficulty performing the following activities?:  Driving? No Managing money?  No Feeding yourself? No Getting from bed to chair? No Climbing a flight of stairs? No Preparing food and eating?: No Bathing or showering? No Getting dressed: No Getting to the toilet? No Using the toilet:No Moving around from place to place: No In the past year have you fallen or had a near fall?:Yes  She has had a few falls this past year which were mechanical.  My partner saw her and referred her to PT which she states has helped and she has not had anymore falls since then.   Are you sexually active?  No  Do you have more than one partner?  No  Hearing Difficulties: Yes Do you often ask people to speak up or repeat themselves? Yes Do you experience ringing or noises in your ears? No Do you have difficulty understanding soft or whispered voices? Yes   Do you feel that you have a problem with memory? No  Do you often misplace items? No  Do you feel safe at  home?  Yes  Cognitive  Testing Please see MMSE screen   Advanced Directives have been discussed with the patient? Yes      Indicate any recent Medical Services you may have received from other than [facility] providers in the past year (date may be approximate).  Immunization History  Administered Date(s) Administered   Influenza Split 06/24/2013, 06/12/2014, 08/13/2017, 07/10/2018   Influenza, High Dose Seasonal PF 06/24/2015, 06/28/2016   PFIZER(Purple Top)SARS-COV-2 Vaccination 10/22/2019, 11/12/2019, 07/21/2020   Pneumococcal Conjugate-13 03/19/2014   Pneumococcal Polysaccharide-23 06/30/2010   Tdap 09/18/2013, 01/14/2021   Zoster, Live 04/19/2006    Screening Tests Health Maintenance  Topic Date Due   Zoster Vaccines- Shingrix (1 of 2) Never done   DEXA SCAN  Never done   COVID-19 Vaccine (4 - Pfizer risk series) 09/15/2020   INFLUENZA VACCINE  04/11/2022   Medicare Annual Wellness (AWV)  07/28/2023   TETANUS/TDAP  01/15/2031   Pneumonia Vaccine 41+ Years old  Completed   HPV VACCINES  Aged Out    All answers were reviewed with the patient and necessary referrals were made:  Townsend Roger, MD   07/27/2022   History reviewed: allergies, current medications, past family history, past medical history, past social history, past surgical history, and problem list  Review of Systems A comprehensive review of systems was negative.    Objective:      Body mass index is 24.34 kg/m. BP 118/76 (BP Location: Right Arm, Patient Position: Sitting, Cuff Size: Normal)   Pulse (!) 104   Temp 98 F (36.7 C) (Temporal)   Resp 18   Ht '5\' 4"'$  (1.626 m)   Wt 141 lb 12.8 oz (64.3 kg)   SpO2 98%   BMI 24.34 kg/m   BP 118/76 (BP Location: Right Arm, Patient Position: Sitting, Cuff Size: Normal)   Pulse (!) 104   Temp 98 F (36.7 C) (Temporal)   Resp 18   Ht '5\' 4"'$  (1.626 m)   Wt 141 lb 12.8 oz (64.3 kg)   SpO2 98%   BMI 24.34 kg/m   General Appearance:    Alert, cooperative, no distress, appears  stated age  Head:    Normocephalic, without obvious abnormality, atraumatic  Eyes:    PERRL, conjunctiva/corneas clear, EOM's intact, fundi    benign, both eyes  Ears:    Normal TM's and external ear canals, both ears  Nose:   Nares normal, septum midline, mucosa normal, no drainage    or sinus tenderness  Throat:   Lips, mucosa, and tongue normal; teeth and gums normal  Neck:   Supple, symmetrical, trachea midline, no adenopathy;    thyroid:  no enlargement/tenderness/nodules; no carotid   bruit or JVD  Back:     Symmetric, no curvature, ROM normal, no CVA tenderness  Lungs:     Clear to auscultation bilaterally, respirations unlabored  Chest Wall:    No tenderness or deformity   Heart:    Regular rate and rhythm, S1 and S2 normal, no murmur, rub   or gallop  Breast Exam:     Abdomen:     Soft, non-tender, bowel sounds active all four quadrants,    no masses, no organomegaly  Genitalia:    Rectal:    Extremities:   Extremities normal, atraumatic, no cyanosis or edema  Pulses:   2+ and symmetric all extremities  Skin:   Skin color, texture, turgor normal, no rashes or lesions  Lymph nodes:   Cervical, supraclavicular, and axillary nodes  normal  Neurologic:   CNII-XII intact, normal strength, sensation and reflexes    throughout       Assessment:     1. Atrial fibrillation, unspecified type ([place])   2. Cerebrovascular disease   3. Dysarthria   4. Hypercholesterolemia   5. Stage 3 chronic kidney disease, unspecified whether stage 3a or 3b CKD ([place])   6. Primary osteoarthritis of both knees   7. Acquired hypothyroidism   8. BMI 24.0-24.9, adult      Plan:     During the course of the visit the patient was educated and counseled about appropriate screening and preventive services including:   Pneumococcal vaccine  Screening mammography Bone densitometry screening Colorectal cancer screening Diabetes screening Advanced directives: has an advanced directive  - a copy HAS NOT been provided.  Diet review for nutrition referral? Yes ____  Not Indicated _X___   Patient Instructions (the written plan) was given to the patient.  Medicare Attestation I have personally reviewed: The patient's medical and social history Their use of alcohol, tobacco or illicit drugs Their current medications and supplements The patient's functional ability including ADLs,fall risks, home safety risks, cognitive, and hearing and visual impairment Diet and physical activities Evidence for depression or mood disorders  The patient's weight, height, BMI, and visual acuity have been recorded in the chart.  I have made referrals, counseling, and provided education to the patient based on review of the above and I have provided the patient with a written personalized care plan for preventive services.     Atrial fibrillation ([place]) She seems rate controlled at this time.  I have calculated her CHADVASC2 score which was a 1.  She in on eliquis for anticoagulation.    Acquired hypothyroidism She seems euthryoid.  We will check her TFT's today.  Primary osteoarthritis of both knees She can use tylenol as needed for any arthritic pains.  Stage 3 chronic kidney disease ([place]) The patient has CKD that I noted from her notes from 2017.  I want her to avoid nephrotoxins and avoid NSAIDS.  Hypercholesterolemia She was taken off statins in her 80's and it sounds like she is not interested in this medication.  We will check a FLP today.  Cerebrovascular disease She had a berry aneurysm with a stroke in the past.  She has resultant aphasia.  We will continue risk factor modifcation.  BMI 24.0-24.9, adult I want her to continue with healthy eating and exercise.   Preventative I discussed colonoscopy where she is aged out.  She does not want any more mammograms and she does not want a shingles vaccine.  We discussed RSV vaccine and she is not interested.  We did a MMSE which  she scored a lower score but due to her aphasia.  She does not want a bone density as well.  We will obtain some yearly labs.     Townsend Roger, MD   07/27/2022

## 2022-07-27 NOTE — Assessment & Plan Note (Signed)
The patient has CKD that I noted from her notes from 2017.  I want her to avoid nephrotoxins and avoid NSAIDS.

## 2022-07-27 NOTE — Assessment & Plan Note (Signed)
She seems rate controlled at this time.  I have calculated her CHADVASC2 score which was a 1.  She in on eliquis for anticoagulation.

## 2022-07-27 NOTE — Assessment & Plan Note (Signed)
I want her to continue with healthy eating and exercise.

## 2022-07-31 ENCOUNTER — Encounter: Payer: Self-pay | Admitting: Internal Medicine

## 2022-08-01 LAB — CBC WITH DIFFERENTIAL/PLATELET
Basophils Absolute: 0.1 10*3/uL (ref 0.0–0.2)
Basos: 1 %
EOS (ABSOLUTE): 0.1 10*3/uL (ref 0.0–0.4)
Eos: 2 %
Hematocrit: 47 % — ABNORMAL HIGH (ref 34.0–46.6)
Hemoglobin: 15.7 g/dL (ref 11.1–15.9)
Immature Grans (Abs): 0 10*3/uL (ref 0.0–0.1)
Immature Granulocytes: 0 %
Lymphocytes Absolute: 1.4 10*3/uL (ref 0.7–3.1)
Lymphs: 17 %
MCH: 30.8 pg (ref 26.6–33.0)
MCHC: 33.4 g/dL (ref 31.5–35.7)
MCV: 92 fL (ref 79–97)
Monocytes Absolute: 0.7 10*3/uL (ref 0.1–0.9)
Monocytes: 8 %
Neutrophils Absolute: 5.9 10*3/uL (ref 1.4–7.0)
Neutrophils: 72 %
Platelets: 274 10*3/uL (ref 150–450)
RBC: 5.09 x10E6/uL (ref 3.77–5.28)
RDW: 13.1 % (ref 11.7–15.4)
WBC: 8.1 10*3/uL (ref 3.4–10.8)

## 2022-08-01 LAB — COMPREHENSIVE METABOLIC PANEL
ALT: 47 IU/L — ABNORMAL HIGH (ref 0–32)
AST: 30 IU/L (ref 0–40)
Albumin/Globulin Ratio: 2 (ref 1.2–2.2)
Albumin: 4.4 g/dL (ref 3.6–4.6)
Alkaline Phosphatase: 86 IU/L (ref 44–121)
BUN/Creatinine Ratio: 14 (ref 12–28)
BUN: 14 mg/dL (ref 10–36)
Bilirubin Total: 0.7 mg/dL (ref 0.0–1.2)
CO2: 21 mmol/L (ref 20–29)
Calcium: 10.1 mg/dL (ref 8.7–10.3)
Chloride: 107 mmol/L — ABNORMAL HIGH (ref 96–106)
Creatinine, Ser: 0.97 mg/dL (ref 0.57–1.00)
Globulin, Total: 2.2 g/dL (ref 1.5–4.5)
Glucose: 107 mg/dL — ABNORMAL HIGH (ref 70–99)
Potassium: 5.3 mmol/L — ABNORMAL HIGH (ref 3.5–5.2)
Sodium: 145 mmol/L — ABNORMAL HIGH (ref 134–144)
Total Protein: 6.6 g/dL (ref 6.0–8.5)
eGFR: 55 mL/min/{1.73_m2} — ABNORMAL LOW (ref >59)

## 2022-08-01 LAB — T4, FREE: Free T4: 1.19 ng/dL (ref 0.82–1.77)

## 2022-08-01 LAB — LIPID PANEL W/O CHOL/HDL RATIO
Cholesterol, Total: 247 mg/dL — ABNORMAL HIGH (ref 100–199)
HDL: 71 mg/dL (ref >39)
LDL Chol Calc (NIH): 156 mg/dL — ABNORMAL HIGH (ref 0–99)
Triglycerides: 117 mg/dL (ref 0–149)
VLDL Cholesterol Cal: 20 mg/dL (ref 5–40)

## 2022-08-01 LAB — HGB A1C W/O EAG: Hgb A1c MFr Bld: 5.8 % — ABNORMAL HIGH (ref 4.8–5.6)

## 2022-08-01 LAB — TSH: TSH: 5.29 u[IU]/mL — ABNORMAL HIGH (ref 0.450–4.500)

## 2022-08-08 ENCOUNTER — Ambulatory Visit: Payer: Medicare Other | Admitting: Internal Medicine

## 2022-08-08 DIAGNOSIS — F419 Anxiety disorder, unspecified: Secondary | ICD-10-CM | POA: Diagnosis not present

## 2022-08-08 DIAGNOSIS — I4811 Longstanding persistent atrial fibrillation: Secondary | ICD-10-CM

## 2022-08-08 NOTE — Progress Notes (Signed)
Established Patient Office Visit  Subjective   Patient ID: Peggy Williams, female    DOB: January 05, 1929  Age: 88 y.o. MRN: [id number]  Behaving very strangely   HPI 88 years old female with history of AF, stroke, hypothyroidism who was recently placed to Assisted Living at [facility]. I was asked to see her as she is very anxious and restless. Per staff she was taking medications by herself inappropriately. Staff have taken medications from her. She was seen in Assisted Living on staff request. Patient denies any SOB, she tells me that she is feeling fine. She denies any complaint but staff says she get very anxious and may need some medicine to calm her down.     Review of Systems  Constitutional: Negative.   Respiratory: Negative.    Cardiovascular: Negative.   Gastrointestinal: Negative.   Psychiatric/Behavioral:  The patient is nervous/anxious.       Objective:     There were no vitals taken for this visit.   Physical Exam Constitutional:      Appearance: Normal appearance.  HENT:     Head: Normocephalic and atraumatic.  Cardiovascular:     Rate and Rhythm: Normal rate. Rhythm irregular.     Heart sounds: Normal heart sounds.  Pulmonary:     Effort: Pulmonary effort is normal.     Breath sounds: Normal breath sounds.  Abdominal:     General: Bowel sounds are normal.     Palpations: Abdomen is soft.  Neurological:     General: No focal deficit present.     Mental Status: She is alert.      No results found for any visits on 08/08/22.   The ASCVD Risk score (Arnett DK, et al., 2019) failed to calculate for the following reasons:   The 2019 ASCVD risk score is only valid for ages 38 to 31    Assessment & Plan:   Problem List Items Addressed This Visit       Cardiovascular and Mediastinum   Atrial fibrillation (HCC)    Her rate is controlled.        Other   Anxiety - Primary    I will start her xanax 0.25 mg twa day PRN.       No follow-ups on  file.    Eloisa Northern, MD

## 2022-08-08 NOTE — Assessment & Plan Note (Signed)
I will start her xanax 0.25 mg twa day PRN.

## 2022-08-15 DIAGNOSIS — H353221 Exudative age-related macular degeneration, left eye, with active choroidal neovascularization: Secondary | ICD-10-CM | POA: Diagnosis not present

## 2022-08-15 DIAGNOSIS — H353113 Nonexudative age-related macular degeneration, right eye, advanced atrophic without subfoveal involvement: Secondary | ICD-10-CM | POA: Diagnosis not present

## 2022-08-15 DIAGNOSIS — H35033 Hypertensive retinopathy, bilateral: Secondary | ICD-10-CM | POA: Diagnosis not present

## 2022-08-15 DIAGNOSIS — H43813 Vitreous degeneration, bilateral: Secondary | ICD-10-CM | POA: Diagnosis not present

## 2022-08-17 DIAGNOSIS — R2689 Other abnormalities of gait and mobility: Secondary | ICD-10-CM | POA: Diagnosis not present

## 2022-08-17 DIAGNOSIS — R2681 Unsteadiness on feet: Secondary | ICD-10-CM | POA: Diagnosis not present

## 2022-08-17 DIAGNOSIS — W19XXXD Unspecified fall, subsequent encounter: Secondary | ICD-10-CM | POA: Diagnosis not present

## 2022-08-22 DIAGNOSIS — W19XXXD Unspecified fall, subsequent encounter: Secondary | ICD-10-CM | POA: Diagnosis not present

## 2022-08-22 DIAGNOSIS — R2681 Unsteadiness on feet: Secondary | ICD-10-CM | POA: Diagnosis not present

## 2022-08-22 DIAGNOSIS — R2689 Other abnormalities of gait and mobility: Secondary | ICD-10-CM | POA: Diagnosis not present

## 2022-08-28 DIAGNOSIS — W19XXXD Unspecified fall, subsequent encounter: Secondary | ICD-10-CM | POA: Diagnosis not present

## 2022-08-28 DIAGNOSIS — R2681 Unsteadiness on feet: Secondary | ICD-10-CM | POA: Diagnosis not present

## 2022-08-28 DIAGNOSIS — R2689 Other abnormalities of gait and mobility: Secondary | ICD-10-CM | POA: Diagnosis not present

## 2022-08-29 DIAGNOSIS — W19XXXD Unspecified fall, subsequent encounter: Secondary | ICD-10-CM | POA: Diagnosis not present

## 2022-08-29 DIAGNOSIS — R2681 Unsteadiness on feet: Secondary | ICD-10-CM | POA: Diagnosis not present

## 2022-08-29 DIAGNOSIS — R2689 Other abnormalities of gait and mobility: Secondary | ICD-10-CM | POA: Diagnosis not present

## 2022-08-29 NOTE — Progress Notes (Signed)
Pts son notified of lab results

## 2022-09-13 DIAGNOSIS — R2681 Unsteadiness on feet: Secondary | ICD-10-CM | POA: Diagnosis not present

## 2022-09-13 DIAGNOSIS — W19XXXD Unspecified fall, subsequent encounter: Secondary | ICD-10-CM | POA: Diagnosis not present

## 2022-09-13 DIAGNOSIS — R2689 Other abnormalities of gait and mobility: Secondary | ICD-10-CM | POA: Diagnosis not present

## 2022-09-15 DIAGNOSIS — R2689 Other abnormalities of gait and mobility: Secondary | ICD-10-CM | POA: Diagnosis not present

## 2022-09-15 DIAGNOSIS — W19XXXD Unspecified fall, subsequent encounter: Secondary | ICD-10-CM | POA: Diagnosis not present

## 2022-09-15 DIAGNOSIS — R2681 Unsteadiness on feet: Secondary | ICD-10-CM | POA: Diagnosis not present

## 2022-09-20 DIAGNOSIS — W19XXXD Unspecified fall, subsequent encounter: Secondary | ICD-10-CM | POA: Diagnosis not present

## 2022-09-20 DIAGNOSIS — R2681 Unsteadiness on feet: Secondary | ICD-10-CM | POA: Diagnosis not present

## 2022-09-20 DIAGNOSIS — R2689 Other abnormalities of gait and mobility: Secondary | ICD-10-CM | POA: Diagnosis not present

## 2022-09-21 DIAGNOSIS — R600 Localized edema: Secondary | ICD-10-CM

## 2022-09-21 DIAGNOSIS — S81801D Unspecified open wound, right lower leg, subsequent encounter: Secondary | ICD-10-CM

## 2022-09-22 DIAGNOSIS — R2681 Unsteadiness on feet: Secondary | ICD-10-CM | POA: Diagnosis not present

## 2022-09-22 DIAGNOSIS — R2689 Other abnormalities of gait and mobility: Secondary | ICD-10-CM | POA: Diagnosis not present

## 2022-09-22 DIAGNOSIS — W19XXXD Unspecified fall, subsequent encounter: Secondary | ICD-10-CM | POA: Diagnosis not present

## 2022-09-26 DIAGNOSIS — W19XXXD Unspecified fall, subsequent encounter: Secondary | ICD-10-CM | POA: Diagnosis not present

## 2022-09-26 DIAGNOSIS — R2681 Unsteadiness on feet: Secondary | ICD-10-CM | POA: Diagnosis not present

## 2022-09-26 DIAGNOSIS — R2689 Other abnormalities of gait and mobility: Secondary | ICD-10-CM | POA: Diagnosis not present

## 2022-10-03 DIAGNOSIS — R2689 Other abnormalities of gait and mobility: Secondary | ICD-10-CM | POA: Diagnosis not present

## 2022-10-03 DIAGNOSIS — R2681 Unsteadiness on feet: Secondary | ICD-10-CM | POA: Diagnosis not present

## 2022-10-03 DIAGNOSIS — W19XXXD Unspecified fall, subsequent encounter: Secondary | ICD-10-CM | POA: Diagnosis not present

## 2022-10-19 ENCOUNTER — Encounter (HOSPITAL_COMMUNITY): Payer: Self-pay | Admitting: *Deleted

## 2022-11-21 DIAGNOSIS — H35033 Hypertensive retinopathy, bilateral: Secondary | ICD-10-CM | POA: Diagnosis not present

## 2022-11-21 DIAGNOSIS — H353221 Exudative age-related macular degeneration, left eye, with active choroidal neovascularization: Secondary | ICD-10-CM | POA: Diagnosis not present

## 2022-11-21 DIAGNOSIS — H43813 Vitreous degeneration, bilateral: Secondary | ICD-10-CM | POA: Diagnosis not present

## 2022-11-21 DIAGNOSIS — H353113 Nonexudative age-related macular degeneration, right eye, advanced atrophic without subfoveal involvement: Secondary | ICD-10-CM | POA: Diagnosis not present

## 2023-01-31 DIAGNOSIS — M6281 Muscle weakness (generalized): Secondary | ICD-10-CM | POA: Diagnosis not present

## 2023-01-31 DIAGNOSIS — I679 Cerebrovascular disease, unspecified: Secondary | ICD-10-CM | POA: Diagnosis not present

## 2023-01-31 DIAGNOSIS — W19XXXD Unspecified fall, subsequent encounter: Secondary | ICD-10-CM | POA: Diagnosis not present

## 2023-02-01 DIAGNOSIS — M6281 Muscle weakness (generalized): Secondary | ICD-10-CM | POA: Diagnosis not present

## 2023-02-01 DIAGNOSIS — W19XXXD Unspecified fall, subsequent encounter: Secondary | ICD-10-CM | POA: Diagnosis not present

## 2023-02-01 DIAGNOSIS — I679 Cerebrovascular disease, unspecified: Secondary | ICD-10-CM | POA: Diagnosis not present

## 2023-02-07 DIAGNOSIS — M6281 Muscle weakness (generalized): Secondary | ICD-10-CM | POA: Diagnosis not present

## 2023-02-07 DIAGNOSIS — I679 Cerebrovascular disease, unspecified: Secondary | ICD-10-CM | POA: Diagnosis not present

## 2023-02-07 DIAGNOSIS — W19XXXD Unspecified fall, subsequent encounter: Secondary | ICD-10-CM | POA: Diagnosis not present

## 2023-02-13 DIAGNOSIS — M6281 Muscle weakness (generalized): Secondary | ICD-10-CM | POA: Diagnosis not present

## 2023-02-13 DIAGNOSIS — I679 Cerebrovascular disease, unspecified: Secondary | ICD-10-CM | POA: Diagnosis not present

## 2023-02-13 DIAGNOSIS — W19XXXD Unspecified fall, subsequent encounter: Secondary | ICD-10-CM | POA: Diagnosis not present

## 2023-02-16 DIAGNOSIS — H6123 Impacted cerumen, bilateral: Secondary | ICD-10-CM | POA: Diagnosis not present

## 2023-02-20 DIAGNOSIS — M6281 Muscle weakness (generalized): Secondary | ICD-10-CM | POA: Diagnosis not present

## 2023-02-20 DIAGNOSIS — W19XXXD Unspecified fall, subsequent encounter: Secondary | ICD-10-CM | POA: Diagnosis not present

## 2023-02-20 DIAGNOSIS — I679 Cerebrovascular disease, unspecified: Secondary | ICD-10-CM | POA: Diagnosis not present

## 2023-02-27 DIAGNOSIS — I679 Cerebrovascular disease, unspecified: Secondary | ICD-10-CM | POA: Diagnosis not present

## 2023-02-27 DIAGNOSIS — M6281 Muscle weakness (generalized): Secondary | ICD-10-CM | POA: Diagnosis not present

## 2023-02-27 DIAGNOSIS — W19XXXD Unspecified fall, subsequent encounter: Secondary | ICD-10-CM | POA: Diagnosis not present

## 2023-03-01 DIAGNOSIS — M6281 Muscle weakness (generalized): Secondary | ICD-10-CM | POA: Diagnosis not present

## 2023-03-01 DIAGNOSIS — I679 Cerebrovascular disease, unspecified: Secondary | ICD-10-CM | POA: Diagnosis not present

## 2023-03-01 DIAGNOSIS — W19XXXD Unspecified fall, subsequent encounter: Secondary | ICD-10-CM | POA: Diagnosis not present

## 2023-03-06 DIAGNOSIS — M6281 Muscle weakness (generalized): Secondary | ICD-10-CM | POA: Diagnosis not present

## 2023-03-06 DIAGNOSIS — I679 Cerebrovascular disease, unspecified: Secondary | ICD-10-CM | POA: Diagnosis not present

## 2023-03-06 DIAGNOSIS — W19XXXD Unspecified fall, subsequent encounter: Secondary | ICD-10-CM | POA: Diagnosis not present

## 2023-03-08 DIAGNOSIS — M6281 Muscle weakness (generalized): Secondary | ICD-10-CM | POA: Diagnosis not present

## 2023-03-08 DIAGNOSIS — W19XXXD Unspecified fall, subsequent encounter: Secondary | ICD-10-CM | POA: Diagnosis not present

## 2023-03-08 DIAGNOSIS — I679 Cerebrovascular disease, unspecified: Secondary | ICD-10-CM | POA: Diagnosis not present

## 2023-03-13 DIAGNOSIS — W19XXXD Unspecified fall, subsequent encounter: Secondary | ICD-10-CM | POA: Diagnosis not present

## 2023-03-13 DIAGNOSIS — I679 Cerebrovascular disease, unspecified: Secondary | ICD-10-CM | POA: Diagnosis not present

## 2023-03-13 DIAGNOSIS — H35033 Hypertensive retinopathy, bilateral: Secondary | ICD-10-CM | POA: Diagnosis not present

## 2023-03-13 DIAGNOSIS — H353221 Exudative age-related macular degeneration, left eye, with active choroidal neovascularization: Secondary | ICD-10-CM | POA: Diagnosis not present

## 2023-03-13 DIAGNOSIS — H353113 Nonexudative age-related macular degeneration, right eye, advanced atrophic without subfoveal involvement: Secondary | ICD-10-CM | POA: Diagnosis not present

## 2023-03-13 DIAGNOSIS — H43813 Vitreous degeneration, bilateral: Secondary | ICD-10-CM | POA: Diagnosis not present

## 2023-03-13 DIAGNOSIS — Z961 Presence of intraocular lens: Secondary | ICD-10-CM | POA: Diagnosis not present

## 2023-03-13 DIAGNOSIS — M6281 Muscle weakness (generalized): Secondary | ICD-10-CM | POA: Diagnosis not present

## 2023-03-20 DIAGNOSIS — M6281 Muscle weakness (generalized): Secondary | ICD-10-CM | POA: Diagnosis not present

## 2023-03-20 DIAGNOSIS — W19XXXD Unspecified fall, subsequent encounter: Secondary | ICD-10-CM | POA: Diagnosis not present

## 2023-03-20 DIAGNOSIS — I679 Cerebrovascular disease, unspecified: Secondary | ICD-10-CM | POA: Diagnosis not present

## 2023-03-22 DIAGNOSIS — I679 Cerebrovascular disease, unspecified: Secondary | ICD-10-CM | POA: Diagnosis not present

## 2023-03-22 DIAGNOSIS — M6281 Muscle weakness (generalized): Secondary | ICD-10-CM | POA: Diagnosis not present

## 2023-03-22 DIAGNOSIS — W19XXXD Unspecified fall, subsequent encounter: Secondary | ICD-10-CM | POA: Diagnosis not present

## 2023-03-26 DIAGNOSIS — M6281 Muscle weakness (generalized): Secondary | ICD-10-CM | POA: Diagnosis not present

## 2023-03-26 DIAGNOSIS — W19XXXD Unspecified fall, subsequent encounter: Secondary | ICD-10-CM | POA: Diagnosis not present

## 2023-03-26 DIAGNOSIS — I679 Cerebrovascular disease, unspecified: Secondary | ICD-10-CM | POA: Diagnosis not present

## 2023-03-29 DIAGNOSIS — W19XXXD Unspecified fall, subsequent encounter: Secondary | ICD-10-CM | POA: Diagnosis not present

## 2023-03-29 DIAGNOSIS — I679 Cerebrovascular disease, unspecified: Secondary | ICD-10-CM | POA: Diagnosis not present

## 2023-03-29 DIAGNOSIS — M6281 Muscle weakness (generalized): Secondary | ICD-10-CM | POA: Diagnosis not present

## 2023-04-03 DIAGNOSIS — I679 Cerebrovascular disease, unspecified: Secondary | ICD-10-CM | POA: Diagnosis not present

## 2023-04-03 DIAGNOSIS — W19XXXD Unspecified fall, subsequent encounter: Secondary | ICD-10-CM | POA: Diagnosis not present

## 2023-04-03 DIAGNOSIS — M6281 Muscle weakness (generalized): Secondary | ICD-10-CM | POA: Diagnosis not present

## 2023-04-09 DIAGNOSIS — W19XXXD Unspecified fall, subsequent encounter: Secondary | ICD-10-CM | POA: Diagnosis not present

## 2023-04-09 DIAGNOSIS — I679 Cerebrovascular disease, unspecified: Secondary | ICD-10-CM | POA: Diagnosis not present

## 2023-04-09 DIAGNOSIS — M6281 Muscle weakness (generalized): Secondary | ICD-10-CM | POA: Diagnosis not present

## 2023-04-11 DIAGNOSIS — W19XXXD Unspecified fall, subsequent encounter: Secondary | ICD-10-CM | POA: Diagnosis not present

## 2023-04-11 DIAGNOSIS — I679 Cerebrovascular disease, unspecified: Secondary | ICD-10-CM | POA: Diagnosis not present

## 2023-04-11 DIAGNOSIS — M6281 Muscle weakness (generalized): Secondary | ICD-10-CM | POA: Diagnosis not present

## 2023-04-12 DIAGNOSIS — R2681 Unsteadiness on feet: Secondary | ICD-10-CM | POA: Diagnosis not present

## 2023-04-12 DIAGNOSIS — M6281 Muscle weakness (generalized): Secondary | ICD-10-CM | POA: Diagnosis not present

## 2023-04-12 DIAGNOSIS — W19XXXD Unspecified fall, subsequent encounter: Secondary | ICD-10-CM | POA: Diagnosis not present

## 2023-04-12 DIAGNOSIS — I679 Cerebrovascular disease, unspecified: Secondary | ICD-10-CM | POA: Diagnosis not present

## 2023-04-18 DIAGNOSIS — M6281 Muscle weakness (generalized): Secondary | ICD-10-CM | POA: Diagnosis not present

## 2023-04-18 DIAGNOSIS — R2681 Unsteadiness on feet: Secondary | ICD-10-CM | POA: Diagnosis not present

## 2023-04-18 DIAGNOSIS — I679 Cerebrovascular disease, unspecified: Secondary | ICD-10-CM | POA: Diagnosis not present

## 2023-04-18 DIAGNOSIS — W19XXXD Unspecified fall, subsequent encounter: Secondary | ICD-10-CM | POA: Diagnosis not present

## 2023-04-25 DIAGNOSIS — W19XXXD Unspecified fall, subsequent encounter: Secondary | ICD-10-CM | POA: Diagnosis not present

## 2023-04-25 DIAGNOSIS — M6281 Muscle weakness (generalized): Secondary | ICD-10-CM | POA: Diagnosis not present

## 2023-04-25 DIAGNOSIS — I679 Cerebrovascular disease, unspecified: Secondary | ICD-10-CM | POA: Diagnosis not present

## 2023-04-25 DIAGNOSIS — R2681 Unsteadiness on feet: Secondary | ICD-10-CM | POA: Diagnosis not present

## 2023-04-27 DIAGNOSIS — R2681 Unsteadiness on feet: Secondary | ICD-10-CM | POA: Diagnosis not present

## 2023-04-27 DIAGNOSIS — W19XXXD Unspecified fall, subsequent encounter: Secondary | ICD-10-CM | POA: Diagnosis not present

## 2023-04-27 DIAGNOSIS — I679 Cerebrovascular disease, unspecified: Secondary | ICD-10-CM | POA: Diagnosis not present

## 2023-04-27 DIAGNOSIS — M6281 Muscle weakness (generalized): Secondary | ICD-10-CM | POA: Diagnosis not present

## 2023-05-03 DIAGNOSIS — W19XXXD Unspecified fall, subsequent encounter: Secondary | ICD-10-CM | POA: Diagnosis not present

## 2023-05-03 DIAGNOSIS — I679 Cerebrovascular disease, unspecified: Secondary | ICD-10-CM | POA: Diagnosis not present

## 2023-05-03 DIAGNOSIS — R2681 Unsteadiness on feet: Secondary | ICD-10-CM | POA: Diagnosis not present

## 2023-05-03 DIAGNOSIS — M6281 Muscle weakness (generalized): Secondary | ICD-10-CM | POA: Diagnosis not present

## 2023-05-08 DIAGNOSIS — R2681 Unsteadiness on feet: Secondary | ICD-10-CM | POA: Diagnosis not present

## 2023-05-08 DIAGNOSIS — W19XXXD Unspecified fall, subsequent encounter: Secondary | ICD-10-CM | POA: Diagnosis not present

## 2023-05-08 DIAGNOSIS — I679 Cerebrovascular disease, unspecified: Secondary | ICD-10-CM | POA: Diagnosis not present

## 2023-05-08 DIAGNOSIS — M6281 Muscle weakness (generalized): Secondary | ICD-10-CM | POA: Diagnosis not present

## 2023-05-10 DIAGNOSIS — R2681 Unsteadiness on feet: Secondary | ICD-10-CM | POA: Diagnosis not present

## 2023-05-10 DIAGNOSIS — I679 Cerebrovascular disease, unspecified: Secondary | ICD-10-CM | POA: Diagnosis not present

## 2023-05-10 DIAGNOSIS — W19XXXD Unspecified fall, subsequent encounter: Secondary | ICD-10-CM | POA: Diagnosis not present

## 2023-05-10 DIAGNOSIS — M6281 Muscle weakness (generalized): Secondary | ICD-10-CM | POA: Diagnosis not present

## 2023-05-31 DIAGNOSIS — E039 Hypothyroidism, unspecified: Secondary | ICD-10-CM | POA: Diagnosis not present

## 2023-05-31 DIAGNOSIS — I482 Chronic atrial fibrillation, unspecified: Secondary | ICD-10-CM | POA: Diagnosis not present

## 2023-05-31 DIAGNOSIS — I10 Essential (primary) hypertension: Secondary | ICD-10-CM | POA: Diagnosis not present

## 2023-05-31 DIAGNOSIS — M170 Bilateral primary osteoarthritis of knee: Secondary | ICD-10-CM | POA: Diagnosis not present

## 2023-06-04 DIAGNOSIS — E0590 Thyrotoxicosis, unspecified without thyrotoxic crisis or storm: Secondary | ICD-10-CM | POA: Diagnosis not present

## 2023-06-04 DIAGNOSIS — I4891 Unspecified atrial fibrillation: Secondary | ICD-10-CM | POA: Diagnosis not present

## 2023-06-04 DIAGNOSIS — E0591 Thyrotoxicosis, unspecified with thyrotoxic crisis or storm: Secondary | ICD-10-CM | POA: Diagnosis not present

## 2023-06-04 DIAGNOSIS — D649 Anemia, unspecified: Secondary | ICD-10-CM | POA: Diagnosis not present

## 2023-06-05 DIAGNOSIS — Z23 Encounter for immunization: Secondary | ICD-10-CM | POA: Diagnosis not present

## 2023-06-09 NOTE — Assessment & Plan Note (Signed)
Her rate is controlled.

## 2023-07-03 DIAGNOSIS — H43813 Vitreous degeneration, bilateral: Secondary | ICD-10-CM | POA: Diagnosis not present

## 2023-07-03 DIAGNOSIS — H35033 Hypertensive retinopathy, bilateral: Secondary | ICD-10-CM | POA: Diagnosis not present

## 2023-07-03 DIAGNOSIS — H353113 Nonexudative age-related macular degeneration, right eye, advanced atrophic without subfoveal involvement: Secondary | ICD-10-CM | POA: Diagnosis not present

## 2023-07-03 DIAGNOSIS — Z961 Presence of intraocular lens: Secondary | ICD-10-CM | POA: Diagnosis not present

## 2023-07-03 DIAGNOSIS — H353221 Exudative age-related macular degeneration, left eye, with active choroidal neovascularization: Secondary | ICD-10-CM | POA: Diagnosis not present

## 2023-07-30 DIAGNOSIS — D649 Anemia, unspecified: Secondary | ICD-10-CM | POA: Diagnosis not present

## 2023-08-08 DIAGNOSIS — H6123 Impacted cerumen, bilateral: Secondary | ICD-10-CM | POA: Diagnosis not present

## 2023-08-14 DIAGNOSIS — H6123 Impacted cerumen, bilateral: Secondary | ICD-10-CM | POA: Diagnosis not present

## 2023-10-13 ENCOUNTER — Emergency Department (HOSPITAL_COMMUNITY): Payer: Medicare Other

## 2023-10-13 ENCOUNTER — Emergency Department (HOSPITAL_COMMUNITY)
Admission: EM | Admit: 2023-10-13 | Discharge: 2023-10-14 | Disposition: A | Discharge status: Skilled Nursing Facility | Payer: Medicare Other | Attending: Student | Admitting: Student

## 2023-10-13 ENCOUNTER — Encounter (HOSPITAL_COMMUNITY): Payer: Self-pay

## 2023-10-13 ENCOUNTER — Other Ambulatory Visit: Payer: Self-pay

## 2023-10-13 DIAGNOSIS — Z043 Encounter for examination and observation following other accident: Secondary | ICD-10-CM | POA: Diagnosis not present

## 2023-10-13 DIAGNOSIS — Z87891 Personal history of nicotine dependence: Secondary | ICD-10-CM | POA: Insufficient documentation

## 2023-10-13 DIAGNOSIS — W19XXXA Unspecified fall, initial encounter: Secondary | ICD-10-CM | POA: Insufficient documentation

## 2023-10-13 DIAGNOSIS — R0902 Hypoxemia: Secondary | ICD-10-CM | POA: Diagnosis not present

## 2023-10-13 DIAGNOSIS — S3993XA Unspecified injury of pelvis, initial encounter: Secondary | ICD-10-CM | POA: Diagnosis not present

## 2023-10-13 DIAGNOSIS — I517 Cardiomegaly: Secondary | ICD-10-CM | POA: Diagnosis not present

## 2023-10-13 DIAGNOSIS — I700 Atherosclerosis of aorta: Secondary | ICD-10-CM | POA: Diagnosis not present

## 2023-10-13 DIAGNOSIS — Z7901 Long term (current) use of anticoagulants: Secondary | ICD-10-CM | POA: Diagnosis not present

## 2023-10-13 DIAGNOSIS — R52 Pain, unspecified: Secondary | ICD-10-CM | POA: Diagnosis not present

## 2023-10-13 DIAGNOSIS — Z79899 Other long term (current) drug therapy: Secondary | ICD-10-CM | POA: Insufficient documentation

## 2023-10-13 DIAGNOSIS — S0990XA Unspecified injury of head, initial encounter: Secondary | ICD-10-CM | POA: Diagnosis not present

## 2023-10-13 DIAGNOSIS — D329 Benign neoplasm of meninges, unspecified: Secondary | ICD-10-CM | POA: Diagnosis not present

## 2023-10-13 DIAGNOSIS — I10 Essential (primary) hypertension: Secondary | ICD-10-CM | POA: Diagnosis not present

## 2023-10-13 DIAGNOSIS — R410 Disorientation, unspecified: Secondary | ICD-10-CM | POA: Diagnosis not present

## 2023-10-13 DIAGNOSIS — J984 Other disorders of lung: Secondary | ICD-10-CM | POA: Diagnosis not present

## 2023-10-13 DIAGNOSIS — S199XXA Unspecified injury of neck, initial encounter: Secondary | ICD-10-CM | POA: Diagnosis not present

## 2023-10-13 DIAGNOSIS — G319 Degenerative disease of nervous system, unspecified: Secondary | ICD-10-CM | POA: Diagnosis not present

## 2023-10-13 LAB — COMPREHENSIVE METABOLIC PANEL
ALT: 16 U/L (ref 0–44)
AST: 21 U/L (ref 15–41)
Albumin: 3.7 g/dL (ref 3.5–5.0)
Alkaline Phosphatase: 62 U/L (ref 38–126)
Anion gap: 9 (ref 5–15)
BUN: 11 mg/dL (ref 8–23)
CO2: 25 mmol/L (ref 22–32)
Calcium: 9.3 mg/dL (ref 8.9–10.3)
Chloride: 107 mmol/L (ref 98–111)
Creatinine, Ser: 0.98 mg/dL (ref 0.44–1.00)
GFR, Estimated: 54 mL/min — ABNORMAL LOW (ref >60)
Glucose, Bld: 127 mg/dL — ABNORMAL HIGH (ref 70–99)
Potassium: 3.9 mmol/L (ref 3.5–5.1)
Sodium: 141 mmol/L (ref 135–145)
Total Bilirubin: 0.8 mg/dL (ref 0.0–1.2)
Total Protein: 6.4 g/dL — ABNORMAL LOW (ref 6.5–8.1)

## 2023-10-13 LAB — CBC WITH DIFFERENTIAL/PLATELET
Abs Immature Granulocytes: 0.05 10*3/uL (ref 0.00–0.07)
Basophils Absolute: 0.1 10*3/uL (ref 0.0–0.1)
Basophils Relative: 1 %
Eosinophils Absolute: 0.1 10*3/uL (ref 0.0–0.5)
Eosinophils Relative: 1 %
HCT: 47.1 % — ABNORMAL HIGH (ref 36.0–46.0)
Hemoglobin: 15.6 g/dL — ABNORMAL HIGH (ref 12.0–15.0)
Immature Granulocytes: 1 %
Lymphocytes Relative: 5 %
Lymphs Abs: 0.4 10*3/uL — ABNORMAL LOW (ref 0.7–4.0)
MCH: 30.6 pg (ref 26.0–34.0)
MCHC: 33.1 g/dL (ref 30.0–36.0)
MCV: 92.4 fL (ref 80.0–100.0)
Monocytes Absolute: 0.7 10*3/uL (ref 0.1–1.0)
Monocytes Relative: 8 %
Neutro Abs: 8.2 10*3/uL — ABNORMAL HIGH (ref 1.7–7.7)
Neutrophils Relative %: 84 %
Platelets: 224 10*3/uL (ref 150–400)
RBC: 5.1 MIL/uL (ref 3.87–5.11)
RDW: 13.4 % (ref 11.5–15.5)
WBC: 9.6 10*3/uL (ref 4.0–10.5)
nRBC: 0 % (ref 0.0–0.2)

## 2023-10-13 NOTE — ED Triage Notes (Addendum)
PT BIB [facility] from [facility] after 2 unwitnessed falls, found on the floor lying by her recliner. No confirmed LOC or head trauma. PT is sensitive at the hips and has asphasia at baseline. BP:112/70 HR:60 O2:94% and RR:16.

## 2023-10-13 NOTE — ED Notes (Signed)
Ptar called unable to give pick up time 

## 2023-10-13 NOTE — Progress Notes (Signed)
   10/13/23 2000  Spiritual Encounters  Type of Visit Initial  Care provided to: [facility] partners present during encounter Nurse  Referral source Trauma page  Reason for visit Trauma  OnCall Visit Yes    Chaplain responded to Level 2 page. Pt unavailable to Chaplain, and reportedly answers only "yes" or "no."   Spoke with interdisciplinary team to secure emergency contacts. Spoke with son Jonny Ruiz - [phone]), who appreciated the call and informed me that [facility] had already made him aware of the situation.  No further needs at this time.

## 2023-10-13 NOTE — Progress Notes (Signed)
Orthopedic Tech Progress Note Patient Details:  Peggy Williams 12/12/1928 [id number]  Patient ID: Peggy Williams, female   DOB: 11/07/1928, 89 y.o.   MRN: [id number] I attended trauma page. Trinna Post 10/13/2023, 8:24 PM

## 2023-10-13 NOTE — ED Notes (Signed)
Assumed pt care while waiting on transport pick up from [facility]

## 2023-10-14 NOTE — ED Provider Notes (Signed)
[place] EMERGENCY DEPARTMENT AT [facility] Provider Note  CSN: [id number] Arrival date & time: 10/13/23 1944  Chief Complaint(s) Fall  HPI Peggy Williams is a 89 y.o. female with PMH A-fib on Eliquis, CVA with persistent aphasia who presents as a level 2 trauma for fall on blood thinners.  Patient reportedly had 2 unwitnessed falls and was found down by nursing facility.  No confirmed loss of consciousness or head trauma.  Additional history unable to be obtained as patient is aphasic at baseline.   Past Medical History Past Medical History:  Diagnosis Date   A-fib [facility])    Aphasia    Arthritis    Cataract    Macular degeneration    Stroke [facility])    complication of aneurysm repair   Stroke [facility])    Thyroid disease    Patient Active Problem List   Diagnosis Date Noted   Anxiety 08/08/2022   Hyperglycemia 07/27/2022   Acquired hypothyroidism 07/27/2022   Primary osteoarthritis of both knees 07/27/2022   BMI 24.0-24.9, adult 07/27/2022   Stage 3 chronic kidney disease (HCC) 07/27/2022   Hypercholesterolemia 07/27/2022   Cerebrovascular disease 07/27/2022   Atrial fibrillation (HCC) 07/27/2022   Dysarthria 07/27/2022   New onset atrial flutter (HCC) 07/17/2020   History of CVA (cerebrovascular accident) 07/17/2020   Shingles 07/17/2020   DNR (do not resuscitate) 07/17/2020   Home Medication(s) Prior to Admission medications   Medication Sig Start Date End Date Taking? Authorizing Provider  apixaban (ELIQUIS) 5 MG TABS tablet Take 1 tablet (5 mg total) by mouth 2 (two) times daily. 08/02/21   Meriam Sprague, MD  digoxin (LANOXIN) 0.125 MG tablet Take 1 tablet (0.125 mg total) by mouth daily. Please make overdue appt with Dr. Shari Prows before anymore refills. Thank you 1st attempt 01/06/22   Meriam Sprague, MD  diltiazem (CARDIZEM CD) 240 MG 24 hr capsule Take 1 capsule (240 mg total) by mouth daily. Pt. Needs to make an overdue appt. With Cardiologist  in order to receive future refills. Thank You. 2nd Attempt. 03/02/22   Meriam Sprague, MD  furosemide (LASIX) 20 MG tablet Take 1 tablet (20 mg total) by mouth daily. Patient not taking: Reported on 07/27/2022 12/09/20   Meriam Sprague, MD  levothyroxine (SYNTHROID, LEVOTHROID) 50 MCG tablet Take 50 mcg by mouth daily before breakfast.    [provider]  metoprolol succinate (TOPROL-XL) 25 MG 24 hr tablet Take 0.5 tablets (12.5 mg total) by mouth in the morning and at bedtime. Please make overdue appt with Dr. Shari Prows Cardiologist before anymore refills. Thank you 3rd and Final Attempt 01/24/22   Meriam Sprague, MD  Multiple Vitamins-Minerals [facility] ADULT 50+) CAPS Take 1 capsule by mouth daily in the afternoon.    [provider]  ondansetron (ZOFRAN) 4 MG tablet Take 1 tablet (4 mg total) by mouth every 6 (six) hours. 12/13/20   Koleen Distance, MD  potassium chloride (KLOR-CON) 10 MEQ tablet Take one (1) tablet by mouth (10 meq) daily. 3RD FINAL   ATTEMPT. PT IS OVERDUE FOR AN APPT. CAN OK 30 DAYS SUPPLY. PLEASE HAVE PT CALL TO SCHEDULE appointment. Patient not taking: Reported on 07/27/2022 02/07/22   Meriam Sprague, MD  Vitamin D-Vitamin K (VITAMIN K2-VITAMIN D3) 45-2000 MCG-UNIT CAPS Take 1 capsule by mouth daily in the afternoon.    [provider]  Past Surgical History Past Surgical History:  Procedure Laterality Date   ABDOMINAL HYSTERECTOMY     BRAIN SURGERY     EYE SURGERY     JOINT REPLACEMENT     Family History History reviewed. No pertinent family history.  Social History Social History   Tobacco Use   Smoking status: Former    Current packs/day: 0.00    Average packs/day: 1 pack/day for 45.0 years (45.0 ttl pk-yrs)    Types: Cigarettes    Start date: 33    Quit date: 2019    Years since  quitting: 6.0   Smokeless tobacco: Never  Vaping Use   Vaping status: Never Used  Substance Use Topics   Alcohol use: Yes   Drug use: Not Currently   Allergies Patient has no known allergies.  Review of Systems Review of Systems  Unable to perform ROS: Patient nonverbal    Physical Exam Vital Signs  I have reviewed the triage vital signs BP (!) 166/86   Pulse 85   Temp 98.3 F (36.8 C) (Oral)   Resp (!) 27   Ht 5\' 4"  (1.626 m)   Wt 64.3 kg   SpO2 92%   BMI 24.33 kg/m   Physical Exam Vitals and nursing note reviewed.  Constitutional:      General: She is not in acute distress.    Appearance: She is well-developed.  HENT:     Head: Normocephalic and atraumatic.  Eyes:     Conjunctiva/sclera: Conjunctivae normal.  Cardiovascular:     Rate and Rhythm: Normal rate and regular rhythm.     Heart sounds: No murmur heard. Pulmonary:     Effort: Pulmonary effort is normal. No respiratory distress.     Breath sounds: Normal breath sounds.  Abdominal:     Palpations: Abdomen is soft.     Tenderness: There is no abdominal tenderness.  Musculoskeletal:        General: No swelling.     Cervical back: Neck supple.  Skin:    General: Skin is warm and dry.     Capillary Refill: Capillary refill takes less than 2 seconds.  Neurological:     Mental Status: She is alert.  Psychiatric:        Mood and Affect: Mood normal.     ED Results and Treatments Labs (all labs ordered are listed, but only abnormal results are displayed) Labs Reviewed  COMPREHENSIVE METABOLIC PANEL - Abnormal; Notable for the following components:      Result Value   Glucose, Bld 127 (*)    Total Protein 6.4 (*)    GFR, Estimated 54 (*)    All other components within normal limits  CBC WITH DIFFERENTIAL/PLATELET - Abnormal; Notable for the following components:   Hemoglobin 15.6 (*)    HCT 47.1 (*)    Neutro Abs 8.2 (*)    Lymphs Abs 0.4 (*)    All other components within normal limits  Radiology CT HEAD WO CONTRAST ( ) Result Date: 10/13/2023 CLINICAL DATA:  Head trauma, minor (Age >= 65y); Neck trauma (Age >= 65y). EXAM: CT HEAD WITHOUT CONTRAST CT CERVICAL SPINE WITHOUT CONTRAST TECHNIQUE: Multidetector CT imaging of the head and cervical spine was performed following the standard protocol without intravenous contrast. Multiplanar CT image reconstructions of the cervical spine were also generated. RADIATION DOSE REDUCTION: This exam was performed according to the departmental dose-optimization program which includes automated exposure control, adjustment of the mA and/or kV according to patient size and/or use of iterative reconstruction technique. COMPARISON:  CTA head/neck 03/31/2022. CT head and CT cervical spine 03/31/2022. FINDINGS: CT HEAD FINDINGS Brain: Suspected mildly increased in size of a 3.1 x 2.5 x 3.8 cm mass surrounding the left aneurysm clip. Extensive encephalomalacia the left cerebral hemisphere with smaller remote cerebellar infarcts. No evidence of acute large vascular territory infarct or acute hemorrhage. Additional patchy white matter hypodensities are compatible with chronic microvascular disease. Cerebral atrophy. Vascular: Calcific atherosclerosis.  No hyperdense vessel. Skull: Left craniotomy.  No acute fracture. Sinuses/Orbits: Clear sinuses.  No acute overall findings. CT CERVICAL SPINE FINDINGS Alignment: Similar alignment.  No new sagittal subluxation. Skull base and vertebrae: No acute fracture. Vertebral body heights are maintained. Soft tissues and spinal canal: No prevertebral fluid or swelling. No visible canal hematoma. Disc levels: Similar multilevel degenerative change, greatest at C5-C6. Upper chest: Visualized lung apices are clear. IMPRESSION: 1. No evidence of acute abnormality intracranially or in the cervical spine. 2. Suspect  interval increase in size of a 3.1 x 2.5 x 3.8 cm putative meningioma surrounding the left aneurysm clip (previously 3.1 x 2.5 x 2.0 cm). If the aneurysm clip is MRI compatible, MRI with contrast could further evaluate. If not, CT with contrast could further characterize. Electronically Signed   By: Feliberto Harts M.D.   On: 10/13/2023 21:12   CT CERVICAL SPINE WO CONTRAST Result Date: 10/13/2023 CLINICAL DATA:  Head trauma, minor (Age >= 65y); Neck trauma (Age >= 65y). EXAM: CT HEAD WITHOUT CONTRAST CT CERVICAL SPINE WITHOUT CONTRAST TECHNIQUE: Multidetector CT imaging of the head and cervical spine was performed following the standard protocol without intravenous contrast. Multiplanar CT image reconstructions of the cervical spine were also generated. RADIATION DOSE REDUCTION: This exam was performed according to the departmental dose-optimization program which includes automated exposure control, adjustment of the mA and/or kV according to patient size and/or use of iterative reconstruction technique. COMPARISON:  CTA head/neck 03/31/2022. CT head and CT cervical spine 03/31/2022. FINDINGS: CT HEAD FINDINGS Brain: Suspected mildly increased in size of a 3.1 x 2.5 x 3.8 cm mass surrounding the left aneurysm clip. Extensive encephalomalacia the left cerebral hemisphere with smaller remote cerebellar infarcts. No evidence of acute large vascular territory infarct or acute hemorrhage. Additional patchy white matter hypodensities are compatible with chronic microvascular disease. Cerebral atrophy. Vascular: Calcific atherosclerosis.  No hyperdense vessel. Skull: Left craniotomy.  No acute fracture. Sinuses/Orbits: Clear sinuses.  No acute overall findings. CT CERVICAL SPINE FINDINGS Alignment: Similar alignment.  No new sagittal subluxation. Skull base and vertebrae: No acute fracture. Vertebral body heights are maintained. Soft tissues and spinal canal: No prevertebral fluid or swelling. No visible canal  hematoma. Disc levels: Similar multilevel degenerative change, greatest at C5-C6. Upper chest: Visualized lung apices are clear. IMPRESSION: 1. No evidence of acute abnormality intracranially or in the cervical spine. 2. Suspect interval increase in size of a 3.1 x 2.5 x 3.8 cm putative meningioma surrounding the left aneurysm  clip (previously 3.1 x 2.5 x 2.0 cm). If the aneurysm clip is MRI compatible, MRI with contrast could further evaluate. If not, CT with contrast could further characterize. Electronically Signed   By: Feliberto Harts M.D.   On: 10/13/2023 21:12   DG Pelvis Portable Result Date: 10/13/2023 CLINICAL DATA:  Fall. EXAM: PORTABLE PELVIS 1-2 VIEWS COMPARISON:  03/31/2022. FINDINGS: No fracture or dislocation. IMPRESSION: No evidence of acute trauma. Electronically Signed   By: Leanna Battles M.D.   On: 10/13/2023 20:10   DG Chest Portable 1 View Result Date: 10/13/2023 CLINICAL DATA:  Fall. EXAM: PORTABLE CHEST 1 VIEW COMPARISON:  03/31/2022 and CT chest 07/17/2020. FINDINGS: Trachea is midline. Heart is enlarged. Thoracic aorta is calcified and somewhat uncoiled. No airspace consolidation or pleural fluid. Pleuroparenchymal scarring in the bases of both hemithoraces. Biapical pleural thickening. Old right rib fractures.  No definite acute fracture. IMPRESSION: 1. No acute findings. 2. Bibasilar pleuroparenchymal scarring. Electronically Signed   By: Leanna Battles M.D.   On: 10/13/2023 20:09    Pertinent labs & imaging results that were available during my care of the patient were reviewed by me and considered in my medical decision making (see MDM for details).  Medications Ordered in ED Medications - No data to display                                                                                                                                   Procedures .Critical Care  Performed by: Glendora Score, MD Authorized by: Glendora Score, MD   Critical care provider statement:     Critical care time (minutes):  30   Critical care was necessary to treat or prevent imminent or life-threatening deterioration of the following conditions:  Trauma   Critical care was time spent personally by me on the following activities:  Development of treatment plan with patient or surrogate, discussions with consultants, evaluation of patient's response to treatment, examination of patient, ordering and review of laboratory studies, ordering and review of radiographic studies, ordering and performing treatments and interventions, pulse oximetry, re-evaluation of patient's condition and review of old charts   (including critical care time)  Medical Decision Making / ED Course   This patient presents to the ED for concern of fall on blood thinners, this involves an extensive number of treatment options, and is a complaint that carries with it a high risk of complications and morbidity.  The differential diagnosis includes fracture, contusion, hematoma, ligamentous injury, closed head injury, ICH, laceration, intrathoracic injury, intra-abdominal injury  MDM: Patient seen emergency room for evaluation of a fall on blood thinners.  Patient arrives as a level 2 trauma and primary survey is unremarkable.  Secondary survey unremarkable with no external signs of trauma or tenderness over the chest abdomen or pelvis.  Neurologic exam unremarkable outside of baseline aphasia with no focal motor or sensory deficits.  Laboratory evaluation is reassuringly unremarkable.  Trauma imaging including CT head, C-spine, chest x-ray and pelvis x-ray negative for acute traumatic injury.  There does appear to be an increase in the size of her meningioma and I spoke with the patient's son Peggy Williams who states that patient's family and medical providers are well aware of this meningioma and they would not like to pursue any surgical intervention even if this was causing a fall.  This is a reasonable plan based on  patient's medical wishes and thus I did not consult neurosurgery for this finding.  With negative trauma workup she does not meet inpatient criteria for admission and will be discharged back to her skilled nursing facility.   Additional history obtained:  -External records from outside source obtained and reviewed including: Chart review including previous notes, labs, imaging, consultation notes   Lab Tests: -I ordered, reviewed, and interpreted labs.   The pertinent results include:   Labs Reviewed  COMPREHENSIVE METABOLIC PANEL - Abnormal; Notable for the following components:      Result Value   Glucose, Bld 127 (*)    Total Protein 6.4 (*)    GFR, Estimated 54 (*)    All other components within normal limits  CBC WITH DIFFERENTIAL/PLATELET - Abnormal; Notable for the following components:   Hemoglobin 15.6 (*)    HCT 47.1 (*)    Neutro Abs 8.2 (*)    Lymphs Abs 0.4 (*)    All other components within normal limits      EKG   EKG Interpretation Date/Time:  Saturday October 13 2023 19:59:28 EST Ventricular Rate:  89 PR Interval:    QRS Duration:  92 QT Interval:  347 QTC Calculation: 423 R Axis:   -39  Text Interpretation: Atrial fibrillation Ventricular premature complex Left axis deviation Nonspecific repol abnormality, diffuse leads Confirmed by Rihana Kiddy ([id number]) on 10/14/2023 1:04:21 AM         Imaging Studies ordered: I ordered imaging studies including chest x-ray, pelvis x-ray, CT head and C-spine I independently visualized and interpreted imaging. I agree with the radiologist interpretation   Medicines ordered and prescription drug management: No orders of the defined types were placed in this encounter.   -I have reviewed the patients home medicines and have made adjustments as needed  Critical interventions Trauma activation and evaluation    Cardiac Monitoring: The patient was maintained on a cardiac monitor.  I personally viewed and  interpreted the cardiac monitored which showed an underlying rhythm of: NSR  Social Determinants of Health:  Factors impacting patients care include: Lives in skilled nursing facility   Reevaluation: After the interventions noted above, I reevaluated the patient and found that they have :stayed the same  Co morbidities that complicate the patient evaluation  Past Medical History:  Diagnosis Date   A-fib [facility])    Aphasia    Arthritis    Cataract    Macular degeneration    Stroke [facility])    complication of aneurysm repair   Stroke [facility])    Thyroid disease       Dispostion: I considered admission for this patient, but at this time she does not meet inpatient criteria for admission and will be discharged with outpatient follow-up     Final Clinical Impression(s) / ED Diagnoses Final diagnoses:  Fall, initial encounter  Meningioma [facility])     @PCDICTATION @    Mekisha Bittel, Wyn Forster, MD 10/14/23 [phone]

## 2023-10-23 DIAGNOSIS — Z961 Presence of intraocular lens: Secondary | ICD-10-CM | POA: Diagnosis not present

## 2023-10-23 DIAGNOSIS — H353114 Nonexudative age-related macular degeneration, right eye, advanced atrophic with subfoveal involvement: Secondary | ICD-10-CM | POA: Diagnosis not present

## 2023-10-23 DIAGNOSIS — H353221 Exudative age-related macular degeneration, left eye, with active choroidal neovascularization: Secondary | ICD-10-CM | POA: Diagnosis not present

## 2023-10-23 DIAGNOSIS — H35033 Hypertensive retinopathy, bilateral: Secondary | ICD-10-CM | POA: Diagnosis not present

## 2023-10-23 DIAGNOSIS — H353123 Nonexudative age-related macular degeneration, left eye, advanced atrophic without subfoveal involvement: Secondary | ICD-10-CM | POA: Diagnosis not present

## 2023-10-23 DIAGNOSIS — H43813 Vitreous degeneration, bilateral: Secondary | ICD-10-CM | POA: Diagnosis not present

## 2023-10-26 DIAGNOSIS — N1831 Chronic kidney disease, stage 3a: Secondary | ICD-10-CM | POA: Diagnosis not present

## 2023-10-26 DIAGNOSIS — I10 Essential (primary) hypertension: Secondary | ICD-10-CM | POA: Diagnosis not present

## 2023-10-26 DIAGNOSIS — I480 Paroxysmal atrial fibrillation: Secondary | ICD-10-CM | POA: Diagnosis not present

## 2023-10-26 DIAGNOSIS — E039 Hypothyroidism, unspecified: Secondary | ICD-10-CM | POA: Diagnosis not present

## 2023-10-26 DIAGNOSIS — M170 Bilateral primary osteoarthritis of knee: Secondary | ICD-10-CM | POA: Diagnosis not present

## 2023-10-29 DIAGNOSIS — E119 Type 2 diabetes mellitus without complications: Secondary | ICD-10-CM | POA: Diagnosis not present

## 2023-10-29 DIAGNOSIS — I10 Essential (primary) hypertension: Secondary | ICD-10-CM | POA: Diagnosis not present

## 2023-11-08 DIAGNOSIS — I4891 Unspecified atrial fibrillation: Secondary | ICD-10-CM | POA: Diagnosis not present

## 2023-11-27 DIAGNOSIS — E039 Hypothyroidism, unspecified: Secondary | ICD-10-CM | POA: Diagnosis not present

## 2023-11-30 DIAGNOSIS — N390 Urinary tract infection, site not specified: Secondary | ICD-10-CM | POA: Diagnosis not present

## 2024-01-16 DIAGNOSIS — M170 Bilateral primary osteoarthritis of knee: Secondary | ICD-10-CM | POA: Diagnosis not present

## 2024-01-16 DIAGNOSIS — E039 Hypothyroidism, unspecified: Secondary | ICD-10-CM | POA: Diagnosis not present

## 2024-01-16 DIAGNOSIS — I10 Essential (primary) hypertension: Secondary | ICD-10-CM | POA: Diagnosis not present

## 2024-01-16 DIAGNOSIS — I480 Paroxysmal atrial fibrillation: Secondary | ICD-10-CM | POA: Diagnosis not present

## 2024-01-27 ENCOUNTER — Encounter (HOSPITAL_COMMUNITY): Payer: Self-pay

## 2024-01-27 ENCOUNTER — Emergency Department (HOSPITAL_COMMUNITY)

## 2024-01-27 ENCOUNTER — Other Ambulatory Visit: Payer: Self-pay

## 2024-01-27 ENCOUNTER — Inpatient Hospital Stay (HOSPITAL_COMMUNITY)
Admission: EM | Admit: 2024-01-27 | Discharge: 2024-02-01 | DRG: 418 | Disposition: A | Discharge status: Skilled Nursing Facility | Source: Skilled Nursing Facility | Attending: Internal Medicine | Admitting: Internal Medicine

## 2024-01-27 DIAGNOSIS — I480 Paroxysmal atrial fibrillation: Secondary | ICD-10-CM | POA: Diagnosis present

## 2024-01-27 DIAGNOSIS — M47816 Spondylosis without myelopathy or radiculopathy, lumbar region: Secondary | ICD-10-CM | POA: Diagnosis not present

## 2024-01-27 DIAGNOSIS — K429 Umbilical hernia without obstruction or gangrene: Secondary | ICD-10-CM | POA: Diagnosis present

## 2024-01-27 DIAGNOSIS — R54 Age-related physical debility: Secondary | ICD-10-CM | POA: Diagnosis not present

## 2024-01-27 DIAGNOSIS — I4892 Unspecified atrial flutter: Secondary | ICD-10-CM | POA: Diagnosis present

## 2024-01-27 DIAGNOSIS — R519 Headache, unspecified: Secondary | ICD-10-CM | POA: Diagnosis not present

## 2024-01-27 DIAGNOSIS — R0902 Hypoxemia: Secondary | ICD-10-CM | POA: Diagnosis not present

## 2024-01-27 DIAGNOSIS — Z751 Person awaiting admission to adequate facility elsewhere: Secondary | ICD-10-CM

## 2024-01-27 DIAGNOSIS — S199XXA Unspecified injury of neck, initial encounter: Secondary | ICD-10-CM | POA: Diagnosis not present

## 2024-01-27 DIAGNOSIS — M549 Dorsalgia, unspecified: Secondary | ICD-10-CM | POA: Diagnosis not present

## 2024-01-27 DIAGNOSIS — Z043 Encounter for examination and observation following other accident: Secondary | ICD-10-CM | POA: Diagnosis not present

## 2024-01-27 DIAGNOSIS — M5125 Other intervertebral disc displacement, thoracolumbar region: Secondary | ICD-10-CM | POA: Diagnosis not present

## 2024-01-27 DIAGNOSIS — R000 Tachycardia, unspecified: Secondary | ICD-10-CM | POA: Diagnosis not present

## 2024-01-27 DIAGNOSIS — K82A1 Gangrene of gallbladder in cholecystitis: Secondary | ICD-10-CM | POA: Diagnosis not present

## 2024-01-27 DIAGNOSIS — I69320 Aphasia following cerebral infarction: Secondary | ICD-10-CM | POA: Diagnosis not present

## 2024-01-27 DIAGNOSIS — I4891 Unspecified atrial fibrillation: Secondary | ICD-10-CM | POA: Diagnosis not present

## 2024-01-27 DIAGNOSIS — Y92009 Unspecified place in unspecified non-institutional (private) residence as the place of occurrence of the external cause: Secondary | ICD-10-CM | POA: Diagnosis not present

## 2024-01-27 DIAGNOSIS — Z87891 Personal history of nicotine dependence: Secondary | ICD-10-CM

## 2024-01-27 DIAGNOSIS — K8000 Calculus of gallbladder with acute cholecystitis without obstruction: Principal | ICD-10-CM | POA: Diagnosis present

## 2024-01-27 DIAGNOSIS — N179 Acute kidney failure, unspecified: Secondary | ICD-10-CM | POA: Diagnosis present

## 2024-01-27 DIAGNOSIS — I700 Atherosclerosis of aorta: Secondary | ICD-10-CM | POA: Diagnosis not present

## 2024-01-27 DIAGNOSIS — M25559 Pain in unspecified hip: Secondary | ICD-10-CM | POA: Diagnosis not present

## 2024-01-27 DIAGNOSIS — I10 Essential (primary) hypertension: Secondary | ICD-10-CM | POA: Diagnosis not present

## 2024-01-27 DIAGNOSIS — R918 Other nonspecific abnormal finding of lung field: Secondary | ICD-10-CM | POA: Diagnosis not present

## 2024-01-27 DIAGNOSIS — S2241XA Multiple fractures of ribs, right side, initial encounter for closed fracture: Secondary | ICD-10-CM | POA: Diagnosis not present

## 2024-01-27 DIAGNOSIS — G9389 Other specified disorders of brain: Secondary | ICD-10-CM | POA: Diagnosis not present

## 2024-01-27 DIAGNOSIS — E039 Hypothyroidism, unspecified: Secondary | ICD-10-CM | POA: Diagnosis present

## 2024-01-27 DIAGNOSIS — R2689 Other abnormalities of gait and mobility: Secondary | ICD-10-CM | POA: Diagnosis not present

## 2024-01-27 DIAGNOSIS — Z66 Do not resuscitate: Secondary | ICD-10-CM | POA: Diagnosis not present

## 2024-01-27 DIAGNOSIS — R4701 Aphasia: Secondary | ICD-10-CM | POA: Diagnosis not present

## 2024-01-27 DIAGNOSIS — M1990 Unspecified osteoarthritis, unspecified site: Secondary | ICD-10-CM | POA: Diagnosis not present

## 2024-01-27 DIAGNOSIS — S0990XA Unspecified injury of head, initial encounter: Secondary | ICD-10-CM | POA: Diagnosis not present

## 2024-01-27 DIAGNOSIS — I639 Cerebral infarction, unspecified: Secondary | ICD-10-CM | POA: Diagnosis not present

## 2024-01-27 DIAGNOSIS — Z79890 Hormone replacement therapy (postmenopausal): Secondary | ICD-10-CM

## 2024-01-27 DIAGNOSIS — M6281 Muscle weakness (generalized): Secondary | ICD-10-CM | POA: Diagnosis not present

## 2024-01-27 DIAGNOSIS — Z79899 Other long term (current) drug therapy: Secondary | ICD-10-CM

## 2024-01-27 DIAGNOSIS — W19XXXA Unspecified fall, initial encounter: Principal | ICD-10-CM

## 2024-01-27 DIAGNOSIS — E876 Hypokalemia: Secondary | ICD-10-CM | POA: Diagnosis present

## 2024-01-27 DIAGNOSIS — H3530 Unspecified macular degeneration: Secondary | ICD-10-CM | POA: Diagnosis present

## 2024-01-27 DIAGNOSIS — M4802 Spinal stenosis, cervical region: Secondary | ICD-10-CM | POA: Diagnosis not present

## 2024-01-27 DIAGNOSIS — Z01810 Encounter for preprocedural cardiovascular examination: Secondary | ICD-10-CM | POA: Diagnosis not present

## 2024-01-27 DIAGNOSIS — Z90710 Acquired absence of both cervix and uterus: Secondary | ICD-10-CM

## 2024-01-27 DIAGNOSIS — K8012 Calculus of gallbladder with acute and chronic cholecystitis without obstruction: Secondary | ICD-10-CM | POA: Diagnosis not present

## 2024-01-27 DIAGNOSIS — S3992XA Unspecified injury of lower back, initial encounter: Secondary | ICD-10-CM | POA: Diagnosis not present

## 2024-01-27 DIAGNOSIS — R9431 Abnormal electrocardiogram [ECG] [EKG]: Secondary | ICD-10-CM | POA: Diagnosis not present

## 2024-01-27 DIAGNOSIS — K810 Acute cholecystitis: Secondary | ICD-10-CM | POA: Diagnosis not present

## 2024-01-27 DIAGNOSIS — M5030 Other cervical disc degeneration, unspecified cervical region: Secondary | ICD-10-CM | POA: Diagnosis not present

## 2024-01-27 DIAGNOSIS — Z7901 Long term (current) use of anticoagulants: Secondary | ICD-10-CM | POA: Diagnosis not present

## 2024-01-27 DIAGNOSIS — Z8679 Personal history of other diseases of the circulatory system: Secondary | ICD-10-CM | POA: Diagnosis not present

## 2024-01-27 DIAGNOSIS — R2681 Unsteadiness on feet: Secondary | ICD-10-CM | POA: Diagnosis not present

## 2024-01-27 DIAGNOSIS — N183 Chronic kidney disease, stage 3 unspecified: Secondary | ICD-10-CM | POA: Diagnosis present

## 2024-01-27 DIAGNOSIS — M47814 Spondylosis without myelopathy or radiculopathy, thoracic region: Secondary | ICD-10-CM | POA: Diagnosis not present

## 2024-01-27 DIAGNOSIS — Z8673 Personal history of transient ischemic attack (TIA), and cerebral infarction without residual deficits: Secondary | ICD-10-CM

## 2024-01-27 DIAGNOSIS — S3991XA Unspecified injury of abdomen, initial encounter: Secondary | ICD-10-CM | POA: Diagnosis not present

## 2024-01-27 DIAGNOSIS — M4187 Other forms of scoliosis, lumbosacral region: Secondary | ICD-10-CM | POA: Diagnosis not present

## 2024-01-27 LAB — CBC WITH DIFFERENTIAL/PLATELET
Abs Immature Granulocytes: 0.21 10*3/uL — ABNORMAL HIGH (ref 0.00–0.07)
Basophils Absolute: 0.1 10*3/uL (ref 0.0–0.1)
Basophils Relative: 0 %
Eosinophils Absolute: 0 10*3/uL (ref 0.0–0.5)
Eosinophils Relative: 0 %
HCT: 48.5 % — ABNORMAL HIGH (ref 36.0–46.0)
Hemoglobin: 16.7 g/dL — ABNORMAL HIGH (ref 12.0–15.0)
Immature Granulocytes: 1 %
Lymphocytes Relative: 4 %
Lymphs Abs: 1.1 10*3/uL (ref 0.7–4.0)
MCH: 31.3 pg (ref 26.0–34.0)
MCHC: 34.4 g/dL (ref 30.0–36.0)
MCV: 91 fL (ref 80.0–100.0)
Monocytes Absolute: 1.9 10*3/uL — ABNORMAL HIGH (ref 0.1–1.0)
Monocytes Relative: 8 %
Neutro Abs: 21.7 10*3/uL — ABNORMAL HIGH (ref 1.7–7.7)
Neutrophils Relative %: 87 %
Platelets: 283 10*3/uL (ref 150–400)
RBC: 5.33 MIL/uL — ABNORMAL HIGH (ref 3.87–5.11)
RDW: 14 % (ref 11.5–15.5)
WBC: 24.9 10*3/uL — ABNORMAL HIGH (ref 4.0–10.5)
nRBC: 0 % (ref 0.0–0.2)

## 2024-01-27 LAB — I-STAT CHEM 8, ED
BUN: 19 mg/dL (ref 8–23)
Calcium, Ion: 1.13 mmol/L — ABNORMAL LOW (ref 1.15–1.40)
Chloride: 102 mmol/L (ref 98–111)
Creatinine, Ser: 1.3 mg/dL — ABNORMAL HIGH (ref 0.44–1.00)
Glucose, Bld: 118 mg/dL — ABNORMAL HIGH (ref 70–99)
HCT: 49 % — ABNORMAL HIGH (ref 36.0–46.0)
Hemoglobin: 16.7 g/dL — ABNORMAL HIGH (ref 12.0–15.0)
Potassium: 3.7 mmol/L (ref 3.5–5.1)
Sodium: 139 mmol/L (ref 135–145)
TCO2: 25 mmol/L (ref 22–32)

## 2024-01-27 LAB — COMPREHENSIVE METABOLIC PANEL WITH GFR
ALT: 101 U/L — ABNORMAL HIGH (ref 0–44)
AST: 55 U/L — ABNORMAL HIGH (ref 15–41)
Albumin: 3.7 g/dL (ref 3.5–5.0)
Alkaline Phosphatase: 114 U/L (ref 38–126)
Anion gap: 11 (ref 5–15)
BUN: 15 mg/dL (ref 8–23)
CO2: 26 mmol/L (ref 22–32)
Calcium: 9.8 mg/dL (ref 8.9–10.3)
Chloride: 102 mmol/L (ref 98–111)
Creatinine, Ser: 1.24 mg/dL — ABNORMAL HIGH (ref 0.44–1.00)
GFR, Estimated: 41 mL/min — ABNORMAL LOW (ref >60)
Glucose, Bld: 120 mg/dL — ABNORMAL HIGH (ref 70–99)
Potassium: 3.7 mmol/L (ref 3.5–5.1)
Sodium: 139 mmol/L (ref 135–145)
Total Bilirubin: 2.4 mg/dL — ABNORMAL HIGH (ref 0.0–1.2)
Total Protein: 6.9 g/dL (ref 6.5–8.1)

## 2024-01-27 LAB — PROCALCITONIN: Procalcitonin: 0.59 ng/mL

## 2024-01-27 MED ORDER — PANTOPRAZOLE SODIUM 40 MG IV SOLR
40.0000 mg | INTRAVENOUS | Status: DC
  Administered 2024-01-27 – 2024-01-29 (×3): 40 mg via INTRAVENOUS
  Filled 2024-01-27 (×3): qty 10

## 2024-01-27 MED ORDER — PIPERACILLIN-TAZOBACTAM 3.375 G IVPB 30 MIN
3.3750 g | Freq: Once | INTRAVENOUS | Status: AC
  Administered 2024-01-27: 3.375 g via INTRAVENOUS
  Filled 2024-01-27: qty 50

## 2024-01-27 MED ORDER — SODIUM CHLORIDE 0.9% FLUSH: 3.0000 mL | INTRAVENOUS | Status: DC | PRN

## 2024-01-27 MED ORDER — MORPHINE SULFATE (PF) 2 MG/ML IV SOLN
2.0000 mg | INTRAVENOUS | Status: DC | PRN
  Administered 2024-01-27 – 2024-01-31 (×8): 2 mg via INTRAVENOUS
  Filled 2024-01-27 (×8): qty 1

## 2024-01-27 MED ORDER — ACETAMINOPHEN 325 MG PO TABS
650.0000 mg | ORAL_TABLET | Freq: Four times a day (QID) | ORAL | Status: DC | PRN
  Administered 2024-01-28 – 2024-01-29 (×3): 650 mg via ORAL
  Filled 2024-01-27 (×3): qty 2

## 2024-01-27 MED ORDER — LACTATED RINGERS IV SOLN: Freq: Once | INTRAVENOUS | Status: DC

## 2024-01-27 MED ORDER — SODIUM CHLORIDE 0.9% FLUSH
3.0000 mL | Freq: Two times a day (BID) | INTRAVENOUS | Status: DC
  Administered 2024-01-27 – 2024-02-01 (×8): 3 mL via INTRAVENOUS

## 2024-01-27 MED ORDER — LACTATED RINGERS IV BOLUS
1000.0000 mL | Freq: Once | INTRAVENOUS | Status: AC
  Administered 2024-01-27: 1000 mL via INTRAVENOUS

## 2024-01-27 MED ORDER — FENTANYL CITRATE PF 50 MCG/ML IJ SOSY
25.0000 ug | PREFILLED_SYRINGE | Freq: Once | INTRAMUSCULAR | Status: AC
  Administered 2024-01-27: 25 ug via INTRAVENOUS
  Filled 2024-01-27: qty 1

## 2024-01-27 MED ORDER — IOHEXOL 350 MG/ML SOLN
75.0000 mL | Freq: Once | INTRAVENOUS | Status: AC | PRN
  Administered 2024-01-27: 75 mL via INTRAVENOUS

## 2024-01-27 MED ORDER — DEXTROSE-SODIUM CHLORIDE 5-0.9 % IV SOLN: INTRAVENOUS | Status: DC

## 2024-01-27 MED ORDER — ACETAMINOPHEN 650 MG RE SUPP: 650.0000 mg | Freq: Four times a day (QID) | RECTAL | Status: DC | PRN

## 2024-01-27 MED ORDER — SODIUM CHLORIDE 0.9% FLUSH
3.0000 mL | Freq: Two times a day (BID) | INTRAVENOUS | Status: DC
  Administered 2024-01-27 – 2024-01-28 (×2): 3 mL via INTRAVENOUS
  Administered 2024-01-29 – 2024-01-30 (×2): 10 mL via INTRAVENOUS
  Administered 2024-01-31: 3 mL via INTRAVENOUS
  Administered 2024-02-01: 10 mL via INTRAVENOUS

## 2024-01-27 MED ORDER — PIPERACILLIN-TAZOBACTAM 4.5 G IVPB: 3.3750 g | Freq: Once | INTRAVENOUS | Status: DC

## 2024-01-27 NOTE — ED Notes (Signed)
 Nt called CCMD to put pt on monitor

## 2024-01-27 NOTE — H&P (Addendum)
 History and Physical    Patient: Peggy Williams JXB:[id number] DOB: 10-12-1928 DOA: 01/27/2024 DOS: the patient was seen and examined on 01/28/2024 PCP: Tita Form, MD  Patient coming from: [facility].  Chief complaint: Chief Complaint  Patient presents with   Fall   HPI:  Peggy Williams is a 89 y.o. female with past medical history  of  hypothyroidism and very remote aneurysm with post-procedure CVA (has chronic word-finding difficulties) brought in for a fall that was unwitnessed.  Per John son -who is also her healthcare power of attorney patient at baseline pt uses walker and is usually mobile and walks to dining room and independent for the most part.  Patient has a history of atrial flutter and is on Eliquis  Cardizem  metoprolol  and digoxin .  Patient is also on levothyroxine  50 and Lasix  20.  HPI as per chart review and per ED MD note.  ED Course: Pt in ed at bedside  is alert awake disoriented appears uncomfortable in pain. Vital signs in the ED were notable for the following:  Vitals:   01/27/24 1830 01/27/24 1845 01/27/24 2218 01/27/24 2252  BP: (!) 155/142 (!) 156/73  138/87  Pulse: (!) 115 89  92  Temp:   97.8 F (36.6 C) 98.2 F (36.8 C)  Resp: (!) 21 (!) 25  (!) 24  Height:      Weight:      SpO2: (!) 76% 99%  95%  TempSrc:   Oral   BMI (Calculated):      >>ED evaluation thus far shows: Initial CMP shows glucose 120 AKI with a creatinine of 1.24 EGFR of 41, abnormal LFTs with AST of 55 and ALT of 101 followed by right upper quadrant ultrasound showing acute cholecystitis. CBC showing leukocytosis of 24.9 hemoglobin of 16.7 and normal platelet count. EKG shows A-fib at 87, diffuse low voltage, and minimal ST depressions in the left inferior leads and V6.  >>While in the ED patient received the following: Medications  piperacillin -tazobactam (ZOSYN ) IVPB 3.375 g (3.375 g Intravenous New Bag/Given 01/27/24 1958)  lactated ringers  bolus 1,000 mL (1,000 mLs Intravenous  New Bag/Given 01/27/24 1957)  iohexol  (OMNIPAQUE ) 350 MG/ML injection 75 mL (75 mLs Intravenous Contrast Given 01/27/24 1933)   Review of Systems  Unable to perform ROS: Dementia   Past Medical History:  Diagnosis Date   A-fib (HCC)    Aphasia    Arthritis    Cataract    Macular degeneration    Stroke [facility])    complication of aneurysm repair   Stroke (HCC)    Thyroid  disease    Past Surgical History:  Procedure Laterality Date   ABDOMINAL HYSTERECTOMY     BRAIN SURGERY     EYE SURGERY     JOINT REPLACEMENT      reports that she quit smoking about 6 years ago. Her smoking use included cigarettes. She started smoking about 51 years ago. She has a 45 pack-year smoking history. She has never used smokeless tobacco. She reports current alcohol use. She reports that she does not currently use drugs. No Known Allergies History reviewed. No pertinent family history. Prior to Admission medications   Medication Sig Start Date End Date Taking? Authorizing Provider  apixaban  (ELIQUIS ) 5 MG TABS tablet Take 1 tablet (5 mg total) by mouth 2 (two) times daily. 08/02/21   Sonny Dust, MD  digoxin  (LANOXIN ) 0.125 MG tablet Take 1 tablet (0.125 mg total) by mouth daily. Please make overdue appt with  Dr. Ardell Beauvais before anymore refills. Thank you 1st attempt 01/06/22   Sonny Dust, MD  diltiazem  (CARDIZEM  CD) 240 MG 24 hr capsule Take 1 capsule (240 mg total) by mouth daily. Pt. Needs to make an overdue appt. With Cardiologist in order to receive future refills. Thank You. 2nd Attempt. 03/02/22   Sonny Dust, MD  furosemide  (LASIX ) 20 MG tablet Take 1 tablet (20 mg total) by mouth daily. Patient not taking: Reported on 07/27/2022 12/09/20   Sonny Dust, MD  levothyroxine  (SYNTHROID , LEVOTHROID) 50 MCG tablet Take 50 mcg by mouth daily before breakfast.    [provider]  metoprolol  succinate (TOPROL -XL) 25 MG 24 hr tablet Take 0.5 tablets (12.5 mg total) by  mouth in the morning and at bedtime. Please make overdue appt with Dr. Ardell Beauvais Cardiologist before anymore refills. Thank you 3rd and Final Attempt 01/24/22   Sonny Dust, MD  Multiple Vitamins-Minerals [facility] ADULT 50+) CAPS Take 1 capsule by mouth daily in the afternoon.    [provider]  ondansetron  (ZOFRAN ) 4 MG tablet Take 1 tablet (4 mg total) by mouth every 6 (six) hours. 12/13/20   Vance Gell, MD  potassium chloride  (KLOR-CON ) 10 MEQ tablet Take one (1) tablet by mouth (10 meq) daily. 3RD FINAL   ATTEMPT. PT IS OVERDUE FOR AN APPT. CAN OK 30 DAYS SUPPLY. PLEASE HAVE PT CALL TO SCHEDULE appointment. Patient not taking: Reported on 07/27/2022 02/07/22   Pemberton, Heather E, MD  Vitamin D-Vitamin K (VITAMIN K2-VITAMIN D3) 45-2000 MCG-UNIT CAPS Take 1 capsule by mouth daily in the afternoon.    [provider]                                                                                 Vitals:   01/27/24 1830 01/27/24 1845 01/27/24 2218 01/27/24 2252  BP: (!) 155/142 (!) 156/73  138/87  Pulse: (!) 115 89  92  Resp: (!) 21 (!) 25  (!) 24  Temp:   97.8 F (36.6 C) 98.2 F (36.8 C)  TempSrc:   Oral   SpO2: (!) 76% 99%  95%  Weight:      Height:       Physical Exam Constitutional:      General: She is not in acute distress.    Appearance: She is not ill-appearing.  HENT:     Head: Atraumatic.     Right Ear: External ear normal.     Left Ear: External ear normal.  Eyes:     Pupils: Pupils are equal, round, and reactive to light.  Cardiovascular:     Rate and Rhythm: Normal rate. Rhythm irregular.     Heart sounds: Normal heart sounds.  Pulmonary:     Effort: Pulmonary effort is normal.     Breath sounds: Normal breath sounds.  Abdominal:     General: Bowel sounds are normal.     Palpations: Abdomen is soft.     Tenderness: There is abdominal tenderness.  Musculoskeletal:     Right lower leg: No edema.     Left lower leg: No edema.   Neurological:     General: No focal deficit  present.     Mental Status: She is alert and oriented to person, place, and time.     Labs on Admission: I have personally reviewed following labs and imaging studies CBC: Recent Labs  Lab 01/27/24 1648 01/27/24 1651  WBC 24.9*  --   NEUTROABS 21.7*  --   HGB 16.7* 16.7*  HCT 48.5* 49.0*  MCV 91.0  --   PLT 283  --    Basic Metabolic Panel: Recent Labs  Lab 01/27/24 1648 01/27/24 1651  NA 139 139  K 3.7 3.7  CL 102 102  CO2 26  --   GLUCOSE 120* 118*  BUN 15 19  CREATININE 1.24* 1.30*  CALCIUM 9.8  --    GFR: Estimated Creatinine Clearance: 23.8 mL/min (A) (by C-G formula based on SCr of 1.3 mg/dL (H)). Liver Function Tests: Recent Labs  Lab 01/27/24 1648  AST 55*  ALT 101*  ALKPHOS 114  BILITOT 2.4*  PROT 6.9  ALBUMIN 3.7   CT CHEST ABDOMEN PELVIS W CONTRAST Result Date: 01/27/2024 CLINICAL DATA:  Blunt trauma.  Elevated bilirubin.  Leukocytosis. EXAM: CT CHEST, ABDOMEN, AND PELVIS WITH CONTRAST TECHNIQUE: Multidetector CT imaging of the chest, abdomen and pelvis was performed following the standard protocol during bolus administration of intravenous contrast. RADIATION DOSE REDUCTION: This exam was performed according to the departmental dose-optimization program which includes automated exposure control, adjustment of the mA and/or kV according to patient size and/or use of iterative reconstruction technique. CONTRAST:  75mL OMNIPAQUE  IOHEXOL  350 MG/ML SOLN COMPARISON:  Lumbar and thoracic spine CT performed earlier today. Chest CTA 07/17/2020. Noncontrast abdominal CT 12/13/2020 FINDINGS: CT CHEST FINDINGS Cardiovascular: No evidence of acute aortic or vascular injury. Diffuse calcified and noncalcified atheromatous plaque in the thoracic aorta the heart is mildly enlarged. There are coronary artery calcifications. No pericardial effusion. Mediastinum/Nodes: No mediastinal hemorrhage or hematoma. No mediastinal  adenopathy. Patulous esophagus. No pneumomediastinum. Lungs/Pleura: No pneumothorax or pulmonary contusion. Sequela of chronic lung disease. Bandlike opacity in the right lower lobe which has a rounded component inferiorly, series 4, image 98. Representative measurement 19 x 18 mm, series 4, image 98. Overall this has diminished from 2021 chest CT and favor scarring. Additional areas of subpleural nodularity within the lingula and lateral left lower lobe series 4, image 89, 93, and 97 appear tubular and may represent chronic mucoid impaction. Additional areas of mucoid impaction are seen in the right middle lobe, series 4, image 102 this was present on prior exam with overall improved nodular opacities. No significant pleural effusion. The central airways are clear Musculoskeletal: Thoracic spine assessed on thoracic spine CT earlier today. Remote right anterior rib fractures. No acute rib fracture. No acute sternal fracture. CT ABDOMEN PELVIS FINDINGS Hepatobiliary: Motion artifact in the upper abdomen. There is no evidence of hepatic injury. Mild diffuse hepatic steatosis. The gallbladder is moderately distended. There is marked gallbladder wall thickening, for example series 3, image 67. Multiple punctate radiopaque densities in the gallbladder likely gallstones. Moderate pericholecystic fat stranding. No biliary dilatation. Pancreas: No evidence of injury. No ductal dilatation or inflammation. Spleen: No evidence of injury allowing for motion. No splenomegaly or focal splenic abnormality. Adrenals/Urinary Tract: Adrenal thickening without dominant nodule or evidence of injury. No renal injury. No hydronephrosis. No evidence of renal inflammation. Left renal cyst. No further follow-up imaging is recommended. Unremarkable urinary bladder. Stomach/Bowel: No evidence of bowel injury or bowel inflammation. Stomach is decompressed. No small bowel obstruction. Normal appendix. Left colonic diverticulosis, no  diverticulitis. Vascular/Lymphatic: No evidence of aortic or vascular injury. Moderate aortic atherosclerosis. No bulky abdominopelvic adenopathy. Reproductive: Status post hysterectomy. No adnexal masses. Other: Right upper quadrant fat stranding with trace free fluid. No free air. Musculoskeletal: Lumbar spine assessed on same day lumbar spine CT. No acute pelvic fracture. IMPRESSION: 1. No evidence of acute traumatic injury in the chest, abdomen, or pelvis. 2. Findings highly suspicious for acute cholecystitis. 3. Sequela of chronic lung disease, favoring chronic indolent infection. Bandlike opacity in the right lower lobe has a rounded component inferiorly. Overall this has diminished from 2021 chest CT and favor scarring. Additional areas of subpleural nodularity within the lingula and lateral left lower lobe appear tubular and may represent chronic mucoid impaction. Consider CT follow-up in 3 months to assess for stability. Aortic Atherosclerosis (ICD10-I70.0). Electronically Signed   By: Chadwick Colonel M.D.   On: 01/27/2024 19:58   CT Lumbar Spine Wo Contrast Result Date: 01/27/2024 CLINICAL DATA:  Back trauma, no prior imaging (Age >= 16y).  Fall. EXAM: CT LUMBAR SPINE WITHOUT CONTRAST TECHNIQUE: Multidetector CT imaging of the lumbar spine was performed without intravenous contrast administration. Multiplanar CT image reconstructions were also generated. RADIATION DOSE REDUCTION: This exam was performed according to the departmental dose-optimization program which includes automated exposure control, adjustment of the mA and/or kV according to patient size and/or use of iterative reconstruction technique. COMPARISON:  None Available. FINDINGS: Segmentation: 5 lumbar type vertebrae. Alignment: Convex rightward scoliosis. No subluxation. 4 mm degenerative anterolisthesis of L4 on L5. 2 mm degenerative anterolisthesis of L3 on L4. Slight retrolisthesis in the upper lumbar spine. Vertebrae: No fracture or  focal bone lesion. Paraspinal and other soft tissues: Negative Disc levels: Advanced diffuse degenerative disc disease and facet disease. IMPRESSION: No acute bony abnormality. Scoliosis and degenerative changes in the lumbar spine. Electronically Signed   By: Janeece Mechanic M.D.   On: 01/27/2024 17:47   CT Thoracic Spine Wo Contrast Result Date: 01/27/2024 CLINICAL DATA:  Fall, back pain EXAM: CT THORACIC SPINE WITHOUT CONTRAST TECHNIQUE: Multidetector CT images of the thoracic were obtained using the standard protocol without intravenous contrast. RADIATION DOSE REDUCTION: This exam was performed according to the departmental dose-optimization program which includes automated exposure control, adjustment of the mA and/or kV according to patient size and/or use of iterative reconstruction technique. COMPARISON:  None Available. FINDINGS: Alignment: Slight retrolisthesis of T12 on L1 related to facet disease. Vertebrae: No acute fracture or focal pathologic process. Paraspinal and other soft tissues: Negative paraspinal soft tissues. Rounded airspace opacity in the right lower lobe along a area of platelike scarring. This could reflect nodular scarring. No effusions. Disc levels: Diffuse degenerative disc disease. No visible disc herniation. IMPRESSION: No acute bony abnormality.  Diffuse degenerative changes. Rounded opacity in the right lower lobe along an area of platelike scarring. The rounded opacity measures 2.1 x 1.7 cm. This could reflect a component of scarring, but recommend follow-up to exclude mass. Follow-up chest CT in 3-6 months recommended. Electronically Signed   By: Janeece Mechanic M.D.   On: 01/27/2024 17:44   CT CERVICAL SPINE WO CONTRAST Result Date: 01/27/2024 CLINICAL DATA:  Polytrauma, blunt EXAM: CT CERVICAL SPINE WITHOUT CONTRAST TECHNIQUE: Multidetector CT imaging of the cervical spine was performed without intravenous contrast. Multiplanar CT image reconstructions were also generated.  RADIATION DOSE REDUCTION: This exam was performed according to the departmental dose-optimization program which includes automated exposure control, adjustment of the mA and/or kV according to patient size and/or use of iterative  reconstruction technique. COMPARISON:  None Available. FINDINGS: Alignment: No subluxation Skull base and vertebrae: No acute fracture. No primary bone lesion or focal pathologic process. Soft tissues and spinal canal: No prevertebral fluid or swelling. No visible canal hematoma. Disc levels: Advanced degenerative disc disease most pronounced at C5-6 and C6-7. Advanced bilateral degenerative facet disease. Multilevel bilateral neural foraminal narrowing. Upper chest: No acute findings Other: None Electronically Signed   By: Janeece Mechanic M.D.   On: 01/27/2024 17:41   CT HEAD WO CONTRAST Result Date: 01/27/2024 CLINICAL DATA:  Head trauma, moderate-severe.  Fall. EXAM: CT HEAD WITHOUT CONTRAST TECHNIQUE: Contiguous axial images were obtained from the base of the skull through the vertex without intravenous contrast. RADIATION DOSE REDUCTION: This exam was performed according to the departmental dose-optimization program which includes automated exposure control, adjustment of the mA and/or kV according to patient size and/or use of iterative reconstruction technique. COMPARISON:  None Available. FINDINGS: Brain: Again noted is a hyperdense mass surrounding the left aneurysm clip measuring 3.6 x 3.0 x 2.4 cm, not significantly changed since prior study. This is compatible with a meningioma. Extensive encephalomalacia throughout the left cerebral hemisphere, stable. No acute infarct or hemorrhage. There is atrophy and chronic small vessel disease changes. Vascular: No hyperdense vessel.  Left aneurysm clip noted. Skull: Prior left temporal craniotomy. No acute calvarial abnormality. Sinuses/Orbits: No acute findings Other: None IMPRESSION: No acute intracranial abnormality. Stable meningioma  surrounding the left aneurysm clip measuring 3.6 x 3.0 x 2.4 cm. Stable encephalomalacia throughout much of the left cerebral hemisphere. Atrophy, chronic microvascular disease. Electronically Signed   By: Janeece Mechanic M.D.   On: 01/27/2024 17:39   DG Pelvis Portable Result Date: 01/27/2024 CLINICAL DATA:  Unwitnessed fall. EXAM: PORTABLE PELVIS 1-2 VIEWS COMPARISON:  October 13, 2023. FINDINGS: There is no evidence of pelvic fracture or diastasis. No pelvic bone lesions are seen. IMPRESSION: Negative. Electronically Signed   By: Rosalene Colon M.D.   On: 01/27/2024 16:59   Data Reviewed: Relevant notes from primary care and specialist visits, past discharge summaries as available in EHR, including Care Everywhere. Prior diagnostic testing as pertinent to current admission diagnoses, Updated medications and problem lists for reconciliation ED course, including vitals, labs, imaging, treatment and response to treatment,Triage notes, nursing and pharmacy notes and ED provider's notes.  Notable results as noted in HPI.Discussed case with EDMD/ ED APP/ or Specialty MD on call and as needed.  Assessment & Plan  >>Fall: Suspect 2/2 to her cholecystitis pain.  Patient bedside is also uncomfortable. Fall precautions and aspiration precaution.Patient seen by general surgery and family offered options for cholecystostomy tube versus cholecystitis and surgery.   >> A-fib: Will continue patient on her Eliquis  digoxin  Cardizem  and room allows for blood pressure.  >> Hypothyroidism: Currently patient is on levothyroxine  50 mcg which we will continue with patient's passes swallow evaluation.  >> Acute cholecystitis: Will continue with Zosyn , n.p.o. after midnight. Eliquis  until safe to swallow we will hold once surgical plan is decided for at least 2 days.   DVT prophylaxis:  Eliquis  Consults:  Neuro.  Advance Care Planning:    Code Status: Limited: Do not attempt resuscitation (DNR) -DNR-LIMITED  -Do Not Intubate/DNI    Family Communication:  Daughter.  Disposition Plan:  TBD. Severity of Illness: The appropriate patient status for this patient is OBSERVATION. Observation status is judged to be reasonable and necessary in order to provide the required intensity of service to ensure the patient's safety. The  patient's presenting symptoms, physical exam findings, and initial radiographic and laboratory data in the context of their medical condition is felt to place them at decreased risk for further clinical deterioration. Furthermore, it is anticipated that the patient will be medically stable for discharge from the hospital within 2 midnights of admission.   Unresulted Labs (From admission, onward)     Start     Ordered   01/28/24 0500  Comprehensive metabolic panel  Tomorrow morning,   R        01/27/24 2157   01/28/24 0500  CBC  Tomorrow morning,   R        01/27/24 2157   01/27/24 1941  Blood culture (routine x 2)  BLOOD CULTURE X 2,   R      01/27/24 1940   01/27/24 1741  Urinalysis, w/ Reflex to Culture (Infection Suspected) -Urine, Catheterized  Once,   URGENT       Question:  Specimen Source  Answer:  Urine, Catheterized   01/27/24 1740            Orders Placed This Encounter  Procedures   Blood culture (routine x 2)   DG Pelvis Portable   CT HEAD WO CONTRAST   CT CERVICAL SPINE WO CONTRAST   CT Thoracic Spine Wo Contrast   CT Lumbar Spine Wo Contrast   CT CHEST ABDOMEN PELVIS W CONTRAST   Comprehensive metabolic panel   CBC WITH DIFFERENTIAL   Urinalysis, w/ Reflex to Culture (Infection Suspected) -Urine, Catheterized   Procalcitonin   Comprehensive metabolic panel   CBC   Diet NPO time specified   ED Cardiac monitoring   Measure blood pressure   Initiate Carrier Fluid Protocol   In and Out Cath   Strict intake and output   Nurse to place External Catheter.   Maintain IV access   Vital signs   Notify physician (specify)   Mobility Protocol: No  Restrictions RN to initiate protocols based on patient's level of care   Refer to Sidebar Report Refer to ICU, Med-Surg, Progressive, and Step-Down Mobility Protocol Sidebars   Initiate Adult Central Line Maintenance and Catheter Protocol for patients with central line (CVC, PICC, Port, Hemodialysis, Trialysis)   Daily weights   Intake and Output   Do not place and if present remove PureWick   Initiate Oral Care Protocol   Initiate Carrier Fluid Protocol   RN may order General Admission PRN Orders utilizing "General Admission PRN medications" (through manage orders) for the following patient needs: allergy symptoms (Claritin), cold sores (Carmex), cough (Robitussin DM), eye irritation (Liquifilm Tears), hemorrhoids (Tucks), indigestion (Maalox), minor skin irritation (Hydrocortisone Cream), muscle pain Lovena Rubinstein Gay), nose irritation (saline nasal spray) and sore throat (Chloraseptic spray).   Cardiac Monitoring - Continuous Indefinite   Swallow screen   Do not attempt resuscitation (DNR)- Limited -Do Not Intubate (DNI)   Consult to general surgery   Consult to hospitalist   Consult to Transition of Care   ED Pulse oximetry, continuous   Pulse oximetry check with vital signs   Oxygen  therapy Mode or (Route): Nasal cannula; Liters Per Minute: 2; Keep O2 saturation between: greater than 92 %   I-Stat Chem 8, ED   I-Stat CG4 Lactic Acid   ED EKG   EKG 12-Lead   Admit to Inpatient (patient's expected length of stay will be greater than 2 midnights or inpatient only procedure)   Aspiration precautions   Fall precautions    Author: Lavanda Porter,  MD 12 pm -8 pm. 01/28/2024 2:42 AM >>Please note for any concern,or critical results after hours past 8pm please contact the [facility] hospitalist [facility] floor coverage provider from 7 PM- 7 AM. For on call review [email or web address], username [email or web address] and PW: your phone number<<

## 2024-01-27 NOTE — ED Notes (Signed)
 CN called, pt is going floor, they agreed.

## 2024-01-27 NOTE — Progress Notes (Signed)
 Not enough urine to get specimen for urinalysis patient had already voided. Will try again at another time.

## 2024-01-27 NOTE — ED Provider Notes (Signed)
 [place] [facility] Provider Note  History  Chief Complaint:  Fall  The history is provided by the EMS personnel.  Trauma Mechanism of injury: Fall Injury location: head/neck Injury location detail: head Arrived directly from scene: yes   Fall:      Fall occurred: in unknown circumstances      Impact surface: hard floor      Point of impact: unknown  EMS/PTA data:      Immobilization: C-collar  Current symptoms:      Associated symptoms:            Denies abdominal pain, chest pain and vomiting.   Relevant PMH:      Pharmacological risk factors:            Anticoagulation therapy.     ADRIENE KNIPFER is a 89 y.o. female with a history of a-fib on [facility] who presents as an unwitnessed fall.  Per EMS report they state that the patient was found on the floor.  Unknown time down however less than 2 hours given that her last check she was in the bed.  GCS 13 at baseline due to aphasia.  Nursing home staff stated that she was at her baseline when they called EMS.  Past Medical History:  Diagnosis Date  . A-fib (HCC)   . Aphasia   . Arthritis   . Cataract   . Macular degeneration   . Stroke [facility])    complication of aneurysm repair  . Stroke (HCC)   . Thyroid  disease     Past Surgical History:  Procedure Laterality Date  . ABDOMINAL HYSTERECTOMY    . BRAIN SURGERY    . EYE SURGERY    . JOINT REPLACEMENT      No family history on file.  Social History   Tobacco Use  . Smoking status: Former    Current packs/day: 0.00    Average packs/day: 1 pack/day for 45.0 years (45.0 ttl pk-yrs)    Types: Cigarettes    Start date: 49    Quit date: 2019    Years since quitting: 6.3  . Smokeless tobacco: Never  Vaping Use  . Vaping status: Never Used  Substance Use Topics  . Alcohol use: Yes  . Drug use: Not Currently    Review of Systems  Review of Systems  Cardiovascular:  Negative for chest pain.  Gastrointestinal:  Negative for  abdominal pain and vomiting.     Reviewed and documented in HPI if pertinent.   Physical Exam   Vitals:   01/27/24 2218 01/27/24 2252  BP:  138/87  Pulse:  92  Resp:  (!) 24  Temp: 97.8 F (36.6 C) 98.2 F (36.8 C)  SpO2:  95%      Physical Exam  Constitutional Nursing notes reviewed Vital signs reviewed  Head L posterior knot that has no associated laceration, bogginess or tenderness on exam  ENT PERRL No conjunctival hemorrhage No periorbital ecchymoses, Racoon Eyes, or Battle Sign bilaterally Ears atraumatic No nasal septal deviation or hematoma Mouth and tongue atraumatic Trachea midline.  Neck No C spine stepoffs, deformities, or tenderness C collar in place  Chest Clavicles atraumatic Clavicles stable to anterior compression without crepitus Chest wall with symmetric expansion Chest wall stable to anterior and lateral compression without crepitus  Respiratory Effort normal CTAB No respiratory distress  CV Normal rate DP and radial pulses 2+ and equal bilaterally  Abdomen Soft Non-tender Non-distended No peritonitis No abrasions/contusions  GU Atraumatic No gross blood  MSK Atraumatic No obvious deformity ROM appropriate Pelvis stable to lateral compression  Back T spine non-tender L spine non-tender No step offs or deformities   Skin Warm Dry  Neuro Awake and alert Moving all extremities GCS 14 (e4, v4, m6)   Procedures   Procedures  ED Course - Medical Decision Making  Brief Overview ARILYN BRIERLEY is a 89 y.o. female who presents as per above.  I have reviewed the nursing documentation for past medical history, family history, and social history and agree.  I have reviewed the patient's vital signs. There are no abnormalities.  Initial Differential Diagnoses: I am primarily concerned for pneumonia, UTI, intra-abdominal abscess, intra-abdominal infection, intracranial bleed, intra-abdominal bleeding, rib  fractures, pneumothorax, hemothorax, life-threatening extremity injury, fracture, dislocation, thoracic spine fracture, lumbar spine fracture, cervical spine fracture   Therapies: These medications and interventions were provided for the patient while in the ED.  Medications - No data to display  Testing Results: On my interpretation labs are significant for : Leukocytosis AKI  On my interpretation imaging is significant for: No acute traumatic injuries Concern for acute cholecystitis  EKG Interpretation Date/Time:  Sunday Jan 27 2024 16:44:44 EDT Ventricular Rate:  87 PR Interval:    QRS Duration:  91 QT Interval:  347 QTC Calculation: 418 R Axis:   -23  Text Interpretation: Atrial fibrillation Borderline left axis deviation Borderline low voltage, extremity leads Minimal ST depression, inferior leads Confirmed by Early Glisson ([id number]) on 01/27/2024 4:46:59 PM   See the EMR for full details regarding lab and imaging results.   Medical Decision Making JEREMY MCLAMB is a 89 y.o. female with significant PMHx of CVA on aphasia who presented to the ED by EMS as an activated Level 2 trauma for fall on thinners.  Prior to arrival of the patient, the room was prepared with the following: code cart to bedside, glidescope, suction, BVM.   Upon arrival of the patient, EMS provided pertinent history and exam findings. The patient was transferred over to the trauma bed. ABCs intact as exam below. Once IVs were established, the secondary exam was performed. Pertinent physical exam findings include L posterior scalp without bogginess or tenderness. Portable XRs performed at the bedside. The patient was then prepared and sent to the CT for trauma scans.   Patient started on IVF, IV antiemetics, and IV pain medications.   CT head, c spine, lumbar, thoracic and pelvis x-ray were performed and results are significant for no acute traumatic injuries.  Basic labs reveal leukocytosis and AKI.   Given patient's leukocytosis we did broaden her workup.  I am concerned about infection.  Therefore we did extend CT imaging to include the chest, abdomen and pelvis.  There are concerns for acute cholecystitis.  There is no acute injuries noted however has acute cholecystitis.  I discussed with the family listed in the chart [name] and [name] the patient's son.  They report that the patient would be amenable to having antibiotics and admission.  There were questions if that the patient would want surgery.  However they want surgery's evaluation to determine if patient would be a surgical candidate or other treatment options.  Therefore I will consult general surgery.  After discussing with general surgery patient will be admitted to medicine.  Patient was started on IV Zosyn.  Fluid bolus was given also.  Family will be updated by hospitalist.  Admitted in stable condition.   Problems Addressed: Fall, initial  encounter: acute illness or injury that poses a threat to life or bodily functions  Amount and/or Complexity of Data Reviewed Labs: ordered. Radiology: ordered.  Risk Prescription drug management. Decision regarding hospitalization.     ### All radiography studies, electrocardiograms, and laboratory data were personally reviewed by me and incorporated into my medical decision making. Impression   1. Fall, initial encounter      Note: Chief Executive Officer was used in [employer], minerals of this note.     Arminda Landmark, MD 01/27/24 2310    Early Glisson, MD 01/27/24 [phone]

## 2024-01-27 NOTE — ED Notes (Signed)
 Trauma Response Nurse Documentation   Peggy Williams is a 89 y.o. female arriving to [facility] ED via EMS  On Eliquis  (apixaban ) daily. Trauma was activated as a Level 2 by ED Charge RN based on the following trauma criteria Elderly patients > 65 with head trauma on anti-coagulation (excluding ASA).  Patient cleared for CT by Dr. Annabell Key. Pt transported to CT with trauma response nurse present to monitor. RN remained with the patient throughout their absence from the department for clinical observation.   GCS 13 (baseline).  History   Past Medical History:  Diagnosis Date   A-fib [facility])    Aphasia    Arthritis    Cataract    Macular degeneration    Stroke [facility])    complication of aneurysm repair   Stroke [facility])    Thyroid  disease      Past Surgical History:  Procedure Laterality Date   ABDOMINAL HYSTERECTOMY     BRAIN SURGERY     EYE SURGERY     JOINT REPLACEMENT       Initial Focused Assessment (If applicable, or please see trauma documentation): Airway: intact, patent. Breathing: Breath sounds clear, equal bilaterally.  EMS placed pt on 3L [place] en route due to desaturation to 90% on RA. No acute distress present.  Circulation: No external signs of hemorrhage. Per EMS, small palpable hematoma noted to L posterior occiput, however upon arrival, none palpated by EDP.  Disability: PERRLA. Pt does not speak much at baseline; GCS 13 due to previous stroke.  Expressive aphasia w/ occasional receptive aphasia.  EMS C-collar in place. MAE equally w/ exception to bilateral hip pain.  VS: WDL.    CT's Completed:   CT Head and CT C-Spine   Interventions:  20G PIV to L AC Labs drawn Undressed and assessed CXR Pelvic XR CT head and c-spine completed.  T-spine and L-spine CTs   Plan for disposition:  Other Awaiting scan results  Consults completed:  none at 1800.  Event Summary: Pt BIB [facility] after having an unwitnessed fall.  According to EMS, although the pt's baseline GCS is  a 13 (due mostly to aphasia), pt is still fairly independent and staff rounds on her about every 2 hours.  Upon rounding, pt was found in the floor.  Pt remained at her baseline orientation level throughout and only c/o bilateral hip pain.  Due to the presumed fall being unwitnessed and the pt's mental status being impaired, it is hard to determine if pt struck her head or not.  Therefore, CT head and C-spine performed.  Pt does take eliquis  daily for a hx of a-fib and previous strokes.  Per [employer], pt has two sons Peggy Williams and Peggy Williams). Pt is a DNR and yellow paper was BIB EMS.   Bedside handoff with ED RN Peggy Williams.    Peggy Williams  Trauma Response RN  Please call TRN at [phone] for further assistance.

## 2024-01-27 NOTE — Progress Notes (Signed)
   01/27/24 1700  Spiritual Encounters  Type of Visit Initial  Care provided to: Patient  Conversation partners present during encounter Nurse  Referral source Trauma page  Reason for visit Trauma  OnCall Visit Yes   Chaplain responded to Trauma 2 page, Fall on [place].  Pt unavailable as medical team provides care.  No support persons present.  Chaplain will continue to follow, but no further services needed at this time.  Chaplain services remain available by Spiritual Consult or for emergent cases, paging [phone]  Chaplain Dewitte Foreman, MDiv [email or web address] [phone]

## 2024-01-27 NOTE — ED Notes (Signed)
 Patient transported to CT

## 2024-01-27 NOTE — ED Notes (Signed)
 PT was cleaned, bed changed and brief put on after a incontinent episode.

## 2024-01-27 NOTE — ED Notes (Signed)
In and out unsuccessful. 

## 2024-01-27 NOTE — Hospital Course (Signed)
 [name] is HCPOA.

## 2024-01-27 NOTE — ED Notes (Signed)
 PT ok to go upstairs w/o labs pulled due to EMS  arriving per charge Bridgette Campus RN)

## 2024-01-27 NOTE — Discharge Instructions (Addendum)
 [facility]  Please arrive at least 30 min before your appointment to complete your check in paperwork.  If you are unable to arrive 30 min prior to your appointment time we may have to cancel or reschedule you. LAPAROSCOPIC SURGERY: POST OP INSTRUCTIONS Always review your discharge instruction sheet given to you by the facility where your surgery was performed. IF YOU HAVE DISABILITY OR FAMILY LEAVE FORMS, YOU MUST BRING THEM TO THE OFFICE FOR PROCESSING.   DO NOT GIVE THEM TO YOUR DOCTOR.  PAIN CONTROL  First take acetaminophen  (Tylenol ) AND/or ibuprofen (Advil) to control your pain after surgery.  Follow directions on package.  Taking acetaminophen  (Tylenol ) and/or ibuprofen (Advil) regularly after surgery will help to control your pain and lower the amount of prescription pain medication you may need.  You should not take more than 4,000 mg (4 grams) of acetaminophen  (Tylenol ) in 24 hours.  You should not take ibuprofen (Advil), aleve, motrin, naprosyn or other NSAIDS if you have a history of stomach ulcers or chronic kidney disease.  A prescription for pain medication may be given to you upon discharge.  Take your pain medication as prescribed, if you still have uncontrolled pain after taking acetaminophen  (Tylenol ) or ibuprofen (Advil). Use ice packs to help control pain. If you need a refill on your pain medication, please contact your pharmacy.  They will contact our office to request authorization. Prescriptions will not be filled after 5pm or on week-ends.  HOME MEDICATIONS Take your usually prescribed medications unless otherwise directed.  DIET You should follow a light diet the first few days after arrival home.  Be sure to include lots of fluids daily. Avoid fatty, fried foods.   CONSTIPATION It is common to experience some constipation after surgery and if you are taking pain medication.  Increasing fluid intake and taking a stool softener (such as Colace)  will usually help or prevent this problem from occurring.  A mild laxative (Milk of Magnesia or Miralax) should be taken according to package instructions if there are no bowel movements after 48 hours.  WOUND/INCISION CARE Most patients will experience some swelling and bruising in the area of the incisions.  Ice packs will help.  Swelling and bruising can take several days to resolve.  Unless discharge instructions indicate otherwise, follow guidelines below  STERI-STRIPS - you may remove your outer bandages 48 hours after surgery, and you may shower at that time.  You have steri-strips (small skin tapes) in place directly over the incision.  These strips should be left on the skin for 7-10 days.   DERMABOND/SKIN GLUE - you may shower in 24 hours.  The glue will flake off over the next 2-3 weeks. Any sutures or staples will be removed at the office during your follow-up visit.  ACTIVITIES You may resume regular (light) daily activities beginning the next day--such as daily self-care, walking, climbing stairs--gradually increasing activities as tolerated.  You may have sexual intercourse when it is comfortable.  Refrain from any heavy lifting or straining until approved by your doctor. You may drive when you are no longer taking prescription pain medication, you can comfortably wear a seatbelt, and you can safely maneuver your car and apply brakes.  FOLLOW-UP You should see your doctor in the office for a follow-up appointment approximately 2-3 weeks after your surgery.  You should have been given your post-op/follow-up appointment when your surgery was scheduled.  If you did not receive a post-op/follow-up appointment, make sure  that you call for this appointment within a day or two after you arrive home to insure a convenient appointment time.   WHEN TO CALL YOUR DOCTOR: Fever over 101.0 Inability to urinate Continued bleeding from incision. Increased pain, redness, or drainage from the  incision. Increasing abdominal pain  The clinic staff is available to answer your questions during regular business hours.  Please don't hesitate to call and ask to speak to one of the nurses for clinical concerns.  If you have a medical emergency, go to the nearest emergency room or call 911.  A surgeon from [facility] is always on call at the hospital. [address], [address], [place], [place]  [address] ? [address] [address], [place], [place]   [address] [phone] ? [phone] ? FAX [phone]   The rounded opacity measures 2.1 x 1.7 cm. This could reflect a component of scarring, but recommend follow-up to exclude mass. Follow-up chest CT in 3-6 months recommended.

## 2024-01-27 NOTE — ED Triage Notes (Signed)
 PT BIB [facility] from [facility]  with a unwitnessed FOT.Baseline is a GCS 13.PT was found lying on ground. EMS reported a lump in back of head but none palpable.

## 2024-01-27 NOTE — Progress Notes (Signed)
 Orthopedic Tech Progress Note Patient Details:  ALTAGRACIA RONE 1929-02-10 [id number]  Patient ID: Burns Carwin, female   DOB: [birth year], 89 y.o.   MRN: [id number] Level II; not currently needed. Toi Foster 01/27/2024, 4:54 PM

## 2024-01-28 DIAGNOSIS — I10 Essential (primary) hypertension: Secondary | ICD-10-CM | POA: Diagnosis not present

## 2024-01-28 DIAGNOSIS — Z01810 Encounter for preprocedural cardiovascular examination: Secondary | ICD-10-CM

## 2024-01-28 DIAGNOSIS — Y92009 Unspecified place in unspecified non-institutional (private) residence as the place of occurrence of the external cause: Secondary | ICD-10-CM

## 2024-01-28 DIAGNOSIS — W19XXXA Unspecified fall, initial encounter: Secondary | ICD-10-CM | POA: Diagnosis not present

## 2024-01-28 DIAGNOSIS — K810 Acute cholecystitis: Secondary | ICD-10-CM

## 2024-01-28 LAB — COMPREHENSIVE METABOLIC PANEL WITH GFR
ALT: 62 U/L — ABNORMAL HIGH (ref 0–44)
AST: 32 U/L (ref 15–41)
Albumin: 2.7 g/dL — ABNORMAL LOW (ref 3.5–5.0)
Alkaline Phosphatase: 90 U/L (ref 38–126)
Anion gap: 11 (ref 5–15)
BUN: 11 mg/dL (ref 8–23)
CO2: 21 mmol/L — ABNORMAL LOW (ref 22–32)
Calcium: 8.8 mg/dL — ABNORMAL LOW (ref 8.9–10.3)
Chloride: 106 mmol/L (ref 98–111)
Creatinine, Ser: 0.82 mg/dL (ref 0.44–1.00)
GFR, Estimated: >60 mL/min (ref >60)
Glucose, Bld: 107 mg/dL — ABNORMAL HIGH (ref 70–99)
Potassium: 3.3 mmol/L — ABNORMAL LOW (ref 3.5–5.1)
Sodium: 138 mmol/L (ref 135–145)
Total Bilirubin: 2.1 mg/dL — ABNORMAL HIGH (ref 0.0–1.2)
Total Protein: 5.8 g/dL — ABNORMAL LOW (ref 6.5–8.1)

## 2024-01-28 LAB — CBC
HCT: 45.9 % (ref 36.0–46.0)
Hemoglobin: 15.8 g/dL — ABNORMAL HIGH (ref 12.0–15.0)
MCH: 31.3 pg (ref 26.0–34.0)
MCHC: 34.4 g/dL (ref 30.0–36.0)
MCV: 91.1 fL (ref 80.0–100.0)
Platelets: 233 10*3/uL (ref 150–400)
RBC: 5.04 MIL/uL (ref 3.87–5.11)
RDW: 14 % (ref 11.5–15.5)
WBC: 20.7 10*3/uL — ABNORMAL HIGH (ref 4.0–10.5)
nRBC: 0 % (ref 0.0–0.2)

## 2024-01-28 MED ORDER — DILTIAZEM HCL ER COATED BEADS 120 MG PO CP24
240.0000 mg | ORAL_CAPSULE | Freq: Every day | ORAL | Status: DC
  Administered 2024-01-28 – 2024-02-01 (×5): 240 mg via ORAL
  Filled 2024-01-28 (×6): qty 2

## 2024-01-28 MED ORDER — DIGOXIN 125 MCG PO TABS
0.1250 mg | ORAL_TABLET | Freq: Every day | ORAL | Status: DC
  Administered 2024-01-28 – 2024-02-01 (×5): 0.125 mg via ORAL
  Filled 2024-01-28 (×5): qty 1

## 2024-01-28 MED ORDER — PIPERACILLIN-TAZOBACTAM 3.375 G IVPB 30 MIN
3.3750 g | Freq: Three times a day (TID) | INTRAVENOUS | Status: DC
  Administered 2024-01-28 – 2024-01-30 (×7): 3.375 g via INTRAVENOUS
  Filled 2024-01-28 (×7): qty 50

## 2024-01-28 MED ORDER — LEVOTHYROXINE SODIUM 50 MCG PO TABS
50.0000 ug | ORAL_TABLET | Freq: Every day | ORAL | Status: DC
  Administered 2024-01-29 – 2024-02-01 (×4): 50 ug via ORAL
  Filled 2024-01-28 (×4): qty 1

## 2024-01-28 MED ORDER — SODIUM CHLORIDE 0.9% FLUSH
10.0000 mL | Freq: Two times a day (BID) | INTRAVENOUS | Status: DC
  Administered 2024-01-28 – 2024-02-01 (×8): 10 mL

## 2024-01-28 MED ORDER — SODIUM CHLORIDE 0.9% FLUSH: 10.0000 mL | INTRAVENOUS | Status: DC | PRN

## 2024-01-28 MED ORDER — METOPROLOL SUCCINATE ER 25 MG PO TB24
12.5000 mg | ORAL_TABLET | Freq: Every day | ORAL | Status: DC
  Administered 2024-01-29 – 2024-02-01 (×4): 12.5 mg via ORAL
  Filled 2024-01-28 (×5): qty 1

## 2024-01-28 MED ORDER — HEPARIN (PORCINE) 25000 UT/250ML-% IV SOLN
1000.0000 [IU]/h | INTRAVENOUS | Status: DC
Start: 2024-01-28 — End: 2024-01-29
  Administered 2024-01-28: 900 [IU]/h via INTRAVENOUS
  Filled 2024-01-28: qty 250

## 2024-01-28 MED ORDER — OXYCODONE HCL 5 MG PO TABS: 5.0000 mg | ORAL_TABLET | Freq: Four times a day (QID) | ORAL | Status: DC | PRN

## 2024-01-28 NOTE — Progress Notes (Signed)

## 2024-01-28 NOTE — Progress Notes (Signed)
 Subjective: CC: Continued ttp in the epigastrium and RUQ.  Family thinks she started not feeling well yesterday.   Discussed w/ HCPOA, Trudi Fus, who is a retired PA.  Family reports that pt had a CVA post op ~30 years ago after brain aneurysm surgery (post-procedure CVA bleed s/p coiling).  She is on Eliquis  for PAF with last dose 5/18 am.  She is at an AL facility but overall very independent doing all of her ADL's and mobilizes with a walker. She was able to set up chairs around her apartment when family came to visit for Mother's Day.  Family reports she has not been seen by cardiology in ~3 years and currently is following with IM providers at AL facility. Last visit I see with [facility] in our system is 2022 w/ Dr. Ardell Beauvais.  Last echo in 2021 w/ EF 65-70%, normal LV function, no LV wall motion abnormalities, mild MVR, mod TVR, trivial AVR.  Objective: Vital signs in last 24 hours: Temp:  [97.6 F (36.4 C)-98.2 F (36.8 C)] 98 F (36.7 C) (05/19 0727) Pulse Rate:  [73-115] 80 (05/19 0727) Resp:  [15-25] 15 (05/19 0727) BP: (118-174)/(62-142) 147/71 (05/19 0727) SpO2:  [76 %-99 %] 91 % (05/19 0727) Weight:  [64.3 kg] 64.3 kg (05/18 1653) Last BM Date :  (date unknown)  Intake/Output from previous day: 05/18 0701 - 05/19 0700 In: 1336.8 [P.O.:80; I.V.:207.8; IV Piggyback:1049] Out: -  Intake/Output this shift: No intake/output data recorded.  PE: Gen:  Alert, NAD, pleasant Abd: Soft, ND, epigastric and ruq ttp. Small umbilical hernia.    Lab Results:  Recent Labs    01/27/24 1648 01/27/24 1651  WBC 24.9*  --   HGB 16.7* 16.7*  HCT 48.5* 49.0*  PLT 283  --    BMET Recent Labs    01/27/24 1648 01/27/24 1651  NA 139 139  K 3.7 3.7  CL 102 102  CO2 26  --   GLUCOSE 120* 118*  BUN 15 19  CREATININE 1.24* 1.30*  CALCIUM 9.8  --    PT/INR No results for input(s): "LABPROT", "INR" in the last 72 hours. CMP     Component Value Date/Time   NA  139 01/27/2024 1651   NA 145 (H) 07/27/2022 1450   K 3.7 01/27/2024 1651   CL 102 01/27/2024 1651   CO2 26 01/27/2024 1648   GLUCOSE 118 (H) 01/27/2024 1651   BUN 19 01/27/2024 1651   BUN 14 07/27/2022 1450   CREATININE 1.30 (H) 01/27/2024 1651   CALCIUM 9.8 01/27/2024 1648   PROT 6.9 01/27/2024 1648   PROT 6.6 07/27/2022 1450   ALBUMIN 3.7 01/27/2024 1648   ALBUMIN 4.4 07/27/2022 1450   AST 55 (H) 01/27/2024 1648   ALT 101 (H) 01/27/2024 1648   ALKPHOS 114 01/27/2024 1648   BILITOT 2.4 (H) 01/27/2024 1648   BILITOT 0.7 07/27/2022 1450   GFRNONAA 41 (L) 01/27/2024 1648   Lipase     Component Value Date/Time   LIPASE 46 07/17/2020 0420    Studies/Results: CT CHEST ABDOMEN PELVIS W CONTRAST Result Date: 01/27/2024 CLINICAL DATA:  Blunt trauma.  Elevated bilirubin.  Leukocytosis. EXAM: CT CHEST, ABDOMEN, AND PELVIS WITH CONTRAST TECHNIQUE: Multidetector CT imaging of the chest, abdomen and pelvis was performed following the standard protocol during bolus administration of intravenous contrast. RADIATION DOSE REDUCTION: This exam was performed according to the departmental dose-optimization program which includes automated exposure control, adjustment of the mA and/or  kV according to patient size and/or use of iterative reconstruction technique. CONTRAST:  75mL OMNIPAQUE  IOHEXOL  350 MG/ML SOLN COMPARISON:  Lumbar and thoracic spine CT performed earlier today. Chest CTA 07/17/2020. Noncontrast abdominal CT 12/13/2020 FINDINGS: CT CHEST FINDINGS Cardiovascular: No evidence of acute aortic or vascular injury. Diffuse calcified and noncalcified atheromatous plaque in the thoracic aorta the heart is mildly enlarged. There are coronary artery calcifications. No pericardial effusion. Mediastinum/Nodes: No mediastinal hemorrhage or hematoma. No mediastinal adenopathy. Patulous esophagus. No pneumomediastinum. Lungs/Pleura: No pneumothorax or pulmonary contusion. Sequela of chronic lung disease.  Bandlike opacity in the right lower lobe which has a rounded component inferiorly, series 4, image 98. Representative measurement 19 x 18 mm, series 4, image 98. Overall this has diminished from 2021 chest CT and favor scarring. Additional areas of subpleural nodularity within the lingula and lateral left lower lobe series 4, image 89, 93, and 97 appear tubular and may represent chronic mucoid impaction. Additional areas of mucoid impaction are seen in the right middle lobe, series 4, image 102 this was present on prior exam with overall improved nodular opacities. No significant pleural effusion. The central airways are clear Musculoskeletal: Thoracic spine assessed on thoracic spine CT earlier today. Remote right anterior rib fractures. No acute rib fracture. No acute sternal fracture. CT ABDOMEN PELVIS FINDINGS Hepatobiliary: Motion artifact in the upper abdomen. There is no evidence of hepatic injury. Mild diffuse hepatic steatosis. The gallbladder is moderately distended. There is marked gallbladder wall thickening, for example series 3, image 67. Multiple punctate radiopaque densities in the gallbladder likely gallstones. Moderate pericholecystic fat stranding. No biliary dilatation. Pancreas: No evidence of injury. No ductal dilatation or inflammation. Spleen: No evidence of injury allowing for motion. No splenomegaly or focal splenic abnormality. Adrenals/Urinary Tract: Adrenal thickening without dominant nodule or evidence of injury. No renal injury. No hydronephrosis. No evidence of renal inflammation. Left renal cyst. No further follow-up imaging is recommended. Unremarkable urinary bladder. Stomach/Bowel: No evidence of bowel injury or bowel inflammation. Stomach is decompressed. No small bowel obstruction. Normal appendix. Left colonic diverticulosis, no diverticulitis. Vascular/Lymphatic: No evidence of aortic or vascular injury. Moderate aortic atherosclerosis. No bulky abdominopelvic adenopathy.  Reproductive: Status post hysterectomy. No adnexal masses. Other: Right upper quadrant fat stranding with trace free fluid. No free air. Musculoskeletal: Lumbar spine assessed on same day lumbar spine CT. No acute pelvic fracture. IMPRESSION: 1. No evidence of acute traumatic injury in the chest, abdomen, or pelvis. 2. Findings highly suspicious for acute cholecystitis. 3. Sequela of chronic lung disease, favoring chronic indolent infection. Bandlike opacity in the right lower lobe has a rounded component inferiorly. Overall this has diminished from 2021 chest CT and favor scarring. Additional areas of subpleural nodularity within the lingula and lateral left lower lobe appear tubular and may represent chronic mucoid impaction. Consider CT follow-up in 3 months to assess for stability. Aortic Atherosclerosis (ICD10-I70.0). Electronically Signed   By: Chadwick Colonel M.D.   On: 01/27/2024 19:58   CT Lumbar Spine Wo Contrast Result Date: 01/27/2024 CLINICAL DATA:  Back trauma, no prior imaging (Age >= 16y).  Fall. EXAM: CT LUMBAR SPINE WITHOUT CONTRAST TECHNIQUE: Multidetector CT imaging of the lumbar spine was performed without intravenous contrast administration. Multiplanar CT image reconstructions were also generated. RADIATION DOSE REDUCTION: This exam was performed according to the departmental dose-optimization program which includes automated exposure control, adjustment of the mA and/or kV according to patient size and/or use of iterative reconstruction technique. COMPARISON:  None Available. FINDINGS: Segmentation: 5  lumbar type vertebrae. Alignment: Convex rightward scoliosis. No subluxation. 4 mm degenerative anterolisthesis of L4 on L5. 2 mm degenerative anterolisthesis of L3 on L4. Slight retrolisthesis in the upper lumbar spine. Vertebrae: No fracture or focal bone lesion. Paraspinal and other soft tissues: Negative Disc levels: Advanced diffuse degenerative disc disease and facet disease.  IMPRESSION: No acute bony abnormality. Scoliosis and degenerative changes in the lumbar spine. Electronically Signed   By: Janeece Mechanic M.D.   On: 01/27/2024 17:47   CT Thoracic Spine Wo Contrast Result Date: 01/27/2024 CLINICAL DATA:  Fall, back pain EXAM: CT THORACIC SPINE WITHOUT CONTRAST TECHNIQUE: Multidetector CT images of the thoracic were obtained using the standard protocol without intravenous contrast. RADIATION DOSE REDUCTION: This exam was performed according to the departmental dose-optimization program which includes automated exposure control, adjustment of the mA and/or kV according to patient size and/or use of iterative reconstruction technique. COMPARISON:  None Available. FINDINGS: Alignment: Slight retrolisthesis of T12 on L1 related to facet disease. Vertebrae: No acute fracture or focal pathologic process. Paraspinal and other soft tissues: Negative paraspinal soft tissues. Rounded airspace opacity in the right lower lobe along a area of platelike scarring. This could reflect nodular scarring. No effusions. Disc levels: Diffuse degenerative disc disease. No visible disc herniation. IMPRESSION: No acute bony abnormality.  Diffuse degenerative changes. Rounded opacity in the right lower lobe along an area of platelike scarring. The rounded opacity measures 2.1 x 1.7 cm. This could reflect a component of scarring, but recommend follow-up to exclude mass. Follow-up chest CT in 3-6 months recommended. Electronically Signed   By: Janeece Mechanic M.D.   On: 01/27/2024 17:44   CT CERVICAL SPINE WO CONTRAST Result Date: 01/27/2024 CLINICAL DATA:  Polytrauma, blunt EXAM: CT CERVICAL SPINE WITHOUT CONTRAST TECHNIQUE: Multidetector CT imaging of the cervical spine was performed without intravenous contrast. Multiplanar CT image reconstructions were also generated. RADIATION DOSE REDUCTION: This exam was performed according to the departmental dose-optimization program which includes automated exposure  control, adjustment of the mA and/or kV according to patient size and/or use of iterative reconstruction technique. COMPARISON:  None Available. FINDINGS: Alignment: No subluxation Skull base and vertebrae: No acute fracture. No primary bone lesion or focal pathologic process. Soft tissues and spinal canal: No prevertebral fluid or swelling. No visible canal hematoma. Disc levels: Advanced degenerative disc disease most pronounced at C5-6 and C6-7. Advanced bilateral degenerative facet disease. Multilevel bilateral neural foraminal narrowing. Upper chest: No acute findings Other: None Electronically Signed   By: Janeece Mechanic M.D.   On: 01/27/2024 17:41   CT HEAD WO CONTRAST Result Date: 01/27/2024 CLINICAL DATA:  Head trauma, moderate-severe.  Fall. EXAM: CT HEAD WITHOUT CONTRAST TECHNIQUE: Contiguous axial images were obtained from the base of the skull through the vertex without intravenous contrast. RADIATION DOSE REDUCTION: This exam was performed according to the departmental dose-optimization program which includes automated exposure control, adjustment of the mA and/or kV according to patient size and/or use of iterative reconstruction technique. COMPARISON:  None Available. FINDINGS: Brain: Again noted is a hyperdense mass surrounding the left aneurysm clip measuring 3.6 x 3.0 x 2.4 cm, not significantly changed since prior study. This is compatible with a meningioma. Extensive encephalomalacia throughout the left cerebral hemisphere, stable. No acute infarct or hemorrhage. There is atrophy and chronic small vessel disease changes. Vascular: No hyperdense vessel.  Left aneurysm clip noted. Skull: Prior left temporal craniotomy. No acute calvarial abnormality. Sinuses/Orbits: No acute findings Other: None IMPRESSION: No acute intracranial abnormality.  Stable meningioma surrounding the left aneurysm clip measuring 3.6 x 3.0 x 2.4 cm. Stable encephalomalacia throughout much of the left cerebral hemisphere.  Atrophy, chronic microvascular disease. Electronically Signed   By: Janeece Mechanic M.D.   On: 01/27/2024 17:39   DG Pelvis Portable Result Date: 01/27/2024 CLINICAL DATA:  Unwitnessed fall. EXAM: PORTABLE PELVIS 1-2 VIEWS COMPARISON:  October 13, 2023. FINDINGS: There is no evidence of pelvic fracture or diastasis. No pelvic bone lesions are seen. IMPRESSION: Negative. Electronically Signed   By: Rosalene Colon M.D.   On: 01/27/2024 16:59    Anti-infectives: Anti-infectives (From admission, onward)    Start     Dose/Rate Route Frequency Ordered Stop   01/28/24 0345  piperacillin -tazobactam (ZOSYN ) IVPB 3.375 g        3.375 g 12.5 mL/hr over 240 Minutes Intravenous Every 8 hours 01/28/24 0009     01/27/24 1945  piperacillin -tazobactam (ZOSYN ) IVPB 3.375 g  Status:  Discontinued        3.375 g 150 mL/hr over 30 Minutes Intravenous  Once 01/27/24 1931 01/27/24 1932   01/27/24 1945  piperacillin -tazobactam (ZOSYN ) IVPB 3.375 g        3.375 g 100 mL/hr over 30 Minutes Intravenous  Once 01/27/24 1932 01/27/24 2217        Assessment/Plan Acute Cholecystitis - CT w/ gallstones, mod distended gallbladder, marked gallbladder wall thickening, moderate pericholecystic fluid. No biliary dilation.  - Afebrile. Tachycardia resolved. No systolic hypotension WBC 20.7 (24.9) - LFT"s downtrending w/ T. Bili 2.1 (2.4). Alk Phos wnl. AST wnl. ALT 62 (101). Cont to trend.  - Lipase will be added on to today's labs.  - Cont abx - Will request cardiology evaluation for peri-operative risk stratification and optimization to allow further conversation about option of laparoscopic cholecystectomy with family.  - If was to acutely decompensate would recommend perc chole drain but do not think she needs one now.  - Cont to trend labs - We will follow with you.   FEN - NPO currently. IVF per primary  VTE - SCDs, hold Eliquis (LD 5/18 am). Okay for therapeutic lovenox or heparin  gtt from our standpoint ID -  Zosyn   I reviewed nursing notes, hospitalist notes, last 24 h vitals and pain scores, last 48 h intake and output, last 24 h labs and trends, and last 24 h imaging results.    LOS: 1 day    Delton Filbert, [facility] 01/28/2024, 7:59 AM Please see Amion for pager number during day hours 7:00am-4:30pm

## 2024-01-28 NOTE — Plan of Care (Signed)
   Problem: Education: Goal: Knowledge of General Education information will improve Description: Including pain rating scale, medication(s)/side effects and non-pharmacologic comfort measures Outcome: Progressing   Problem: Pain Managment: Goal: General experience of comfort will improve and/or be controlled Outcome: Progressing   Problem: Safety: Goal: Ability to remain free from injury will improve Outcome: Progressing

## 2024-01-28 NOTE — Consult Note (Incomplete)
 Peggy Williams 1928/09/23  [id number].    Requesting MD: Arminda Landmark, MD Chief Complaint/Reason for Consult: cholecystitis  HPI:  Peggy Williams is a 89 yo female who presented to the ED after a fall. She lives at an assisted living facility and was found on the ground. She is only able to provide limited history due to aphasia, thus the history was obtained from her daughter-in-law. The patient underwent a trauma evaluation in the ED  ROS: ROS  History reviewed. No pertinent family history.  Past Medical History:  Diagnosis Date  . A-fib (HCC)   . Aphasia   . Arthritis   . Cataract   . Macular degeneration   . Stroke [facility])    complication of aneurysm repair  . Stroke (HCC)   . Thyroid  disease     Past Surgical History:  Procedure Laterality Date  . ABDOMINAL HYSTERECTOMY    . BRAIN SURGERY    . EYE SURGERY    . JOINT REPLACEMENT      Social History:  reports that she quit smoking about 6 years ago. Her smoking use included cigarettes. She started smoking about 51 years ago. She has a 45 pack-year smoking history. She has never used smokeless tobacco. She reports current alcohol use. She reports that she does not currently use drugs.  Allergies: No Known Allergies  Medications Prior to Admission  Medication Sig Dispense Refill  . apixaban  (ELIQUIS ) 5 MG TABS tablet Take 1 tablet (5 mg total) by mouth 2 (two) times daily. 180 tablet 1  . digoxin  (LANOXIN ) 0.125 MG tablet Take 1 tablet (0.125 mg total) by mouth daily. Please make overdue appt with Dr. Ardell Beauvais before anymore refills. Thank you 1st attempt 30 tablet 0  . diltiazem  (CARDIZEM  CD) 240 MG 24 hr capsule Take 1 capsule (240 mg total) by mouth daily. Pt. Needs to make an overdue appt. With Cardiologist in order to receive future refills. Thank You. 2nd Attempt. 15 capsule 0  . furosemide  (LASIX ) 20 MG tablet Take 1 tablet (20 mg total) by mouth daily. (Patient not taking: Reported on 07/27/2022) 90 tablet 3  .  levothyroxine  (SYNTHROID , LEVOTHROID) 50 MCG tablet Take 50 mcg by mouth daily before breakfast.    . metoprolol  succinate (TOPROL -XL) 25 MG 24 hr tablet Take 0.5 tablets (12.5 mg total) by mouth in the morning and at bedtime. Please make overdue appt with Dr. Ardell Beauvais Cardiologist before anymore refills. Thank you 3rd and Final Attempt 15 tablet 0  . Multiple Vitamins-Minerals (OCUVITE ADULT 50+) CAPS Take 1 capsule by mouth daily in the afternoon.    . ondansetron  (ZOFRAN ) 4 MG tablet Take 1 tablet (4 mg total) by mouth every 6 (six) hours. 12 tablet 0  . potassium chloride  (KLOR-CON ) 10 MEQ tablet Take one (1) tablet by mouth (10 meq) daily. 3RD FINAL   ATTEMPT. PT IS OVERDUE FOR AN APPT. CAN OK 30 DAYS SUPPLY. PLEASE HAVE PT CALL TO SCHEDULE appointment. (Patient not taking: Reported on 07/27/2022) 15 tablet 0  . Vitamin D-Vitamin K (VITAMIN K2-VITAMIN D3) 45-2000 MCG-UNIT CAPS Take 1 capsule by mouth daily in the afternoon.       Physical Exam: Blood pressure 138/87, pulse 92, temperature 98.2 F (36.8 C), resp. rate (!) 24, height 5\' 4"  (1.626 m), weight 64.3 kg, SpO2 95%. ***  Results for orders placed or performed during the hospital encounter of 01/27/24 (from the past 48 hours)  Comprehensive metabolic panel     Status: Abnormal  Collection Time: 01/27/24  4:48 PM  Result Value Ref Range   Sodium 139 135 - 145 mmol/L   Potassium 3.7 3.5 - 5.1 mmol/L   Chloride 102 98 - 111 mmol/L   CO2 26 22 - 32 mmol/L   Glucose, Bld 120 (H) 70 - 99 mg/dL    Comment: Glucose reference range applies only to samples taken after fasting for at least 8 hours.   BUN 15 8 - 23 mg/dL   Creatinine, Ser 5.78 (H) 0.44 - 1.00 mg/dL   Calcium 9.8 8.9 - 46.9 mg/dL   Total Protein 6.9 6.5 - 8.1 g/dL   Albumin 3.7 3.5 - 5.0 g/dL   AST 55 (H) 15 - 41 U/L   ALT 101 (H) 0 - 44 U/L   Alkaline Phosphatase 114 38 - 126 U/L   Total Bilirubin 2.4 (H) 0.0 - 1.2 mg/dL   GFR, Estimated 41 (L) >60 mL/min     Comment: (NOTE) Calculated using the CKD-EPI Creatinine Equation (2021)    Anion gap 11 5 - 15    Comment: Performed at [facility], [address] [place], [place] [address]  CBC WITH DIFFERENTIAL     Status: Abnormal   Collection Time: 01/27/24  4:48 PM  Result Value Ref Range   WBC 24.9 (H) 4.0 - 10.5 K/uL   RBC 5.33 (H) 3.87 - 5.11 MIL/uL   Hemoglobin 16.7 (H) 12.0 - 15.0 g/dL   HCT 84.1 (H) 32.4 - 40.1 %   MCV 91.0 80.0 - 100.0 fL   MCH 31.3 26.0 - 34.0 pg   MCHC 34.4 30.0 - 36.0 g/dL   RDW 02.7 25.3 - 66.4 %   Platelets 283 150 - 400 K/uL   nRBC 0.0 0.0 - 0.2 %   Neutrophils Relative % 87 %   Neutro Abs 21.7 (H) 1.7 - 7.7 K/uL   Lymphocytes Relative 4 %   Lymphs Abs 1.1 0.7 - 4.0 K/uL   Monocytes Relative 8 %   Monocytes Absolute 1.9 (H) 0.1 - 1.0 K/uL   Eosinophils Relative 0 %   Eosinophils Absolute 0.0 0.0 - 0.5 K/uL   Basophils Relative 0 %   Basophils Absolute 0.1 0.0 - 0.1 K/uL   Immature Granulocytes 1 %   Abs Immature Granulocytes 0.21 (H) 0.00 - 0.07 K/uL    Comment: Performed at [facility], [address] [place], [place] [address]  Procalcitonin     Status: None   Collection Time: 01/27/24  4:48 PM  Result Value Ref Range   Procalcitonin 0.59 ng/mL    Comment:        Interpretation: PCT > 0.5 ng/mL and <= 2 ng/mL: Systemic infection (sepsis) is possible, but other conditions are known to elevate PCT as well. (NOTE)       Sepsis PCT Algorithm           Lower Respiratory Tract                                      Infection PCT Algorithm    ----------------------------     ----------------------------         PCT < 0.25 ng/mL                PCT < 0.10 ng/mL          Strongly encourage  Strongly discourage   discontinuation of antibiotics    initiation of antibiotics    ----------------------------     -----------------------------       PCT 0.25 - 0.50 ng/mL            PCT 0.10 - 0.25 ng/mL               OR       >80%  decrease in PCT            Discourage initiation of                                            antibiotics      Encourage discontinuation           of antibiotics    ----------------------------     -----------------------------         PCT >= 0.50 ng/mL              PCT 0.26 - 0.50 ng/mL                AND       <80% decrease in PCT             Encourage initiation of                                             antibiotics       Encourage continuation           of antibiotics    ----------------------------     -----------------------------        PCT >= 0.50 ng/mL                  PCT > 0.50 ng/mL               AND         increase in PCT                  Strongly encourage                                      initiation of antibiotics    Strongly encourage escalation           of antibiotics                                     -----------------------------                                           PCT <= 0.25 ng/mL                                                 OR                                        >  80% decrease in PCT                                      Discontinue / Do not initiate                                             antibiotics  Performed at [facility], [address], [place], [place] [address]   I-Stat Chem 8, ED     Status: Abnormal   Collection Time: 01/27/24  4:51 PM  Result Value Ref Range   Sodium 139 135 - 145 mmol/L   Potassium 3.7 3.5 - 5.1 mmol/L   Chloride 102 98 - 111 mmol/L   BUN 19 8 - 23 mg/dL   Creatinine, Ser 6.04 (H) 0.44 - 1.00 mg/dL   Glucose, Bld 540 (H) 70 - 99 mg/dL    Comment: Glucose reference range applies only to samples taken after fasting for at least 8 hours.   Calcium, Ion 1.13 (L) 1.15 - 1.40 mmol/L   TCO2 25 22 - 32 mmol/L   Hemoglobin 16.7 (H) 12.0 - 15.0 g/dL   HCT 98.1 (H) 19.1 - 47.8 %   CT CHEST ABDOMEN PELVIS W CONTRAST Result Date: 01/27/2024 CLINICAL DATA:  Blunt trauma.  Elevated bilirubin.   Leukocytosis. EXAM: CT CHEST, ABDOMEN, AND PELVIS WITH CONTRAST TECHNIQUE: Multidetector CT imaging of the chest, abdomen and pelvis was performed following the standard protocol during bolus administration of intravenous contrast. RADIATION DOSE REDUCTION: This exam was performed according to the departmental dose-optimization program which includes automated exposure control, adjustment of the mA and/or kV according to patient size and/or use of iterative reconstruction technique. CONTRAST:  75mL OMNIPAQUE  IOHEXOL  350 MG/ML SOLN COMPARISON:  Lumbar and thoracic spine CT performed earlier today. Chest CTA 07/17/2020. Noncontrast abdominal CT 12/13/2020 FINDINGS: CT CHEST FINDINGS Cardiovascular: No evidence of acute aortic or vascular injury. Diffuse calcified and noncalcified atheromatous plaque in the thoracic aorta the heart is mildly enlarged. There are coronary artery calcifications. No pericardial effusion. Mediastinum/Nodes: No mediastinal hemorrhage or hematoma. No mediastinal adenopathy. Patulous esophagus. No pneumomediastinum. Lungs/Pleura: No pneumothorax or pulmonary contusion. Sequela of chronic lung disease. Bandlike opacity in the right lower lobe which has a rounded component inferiorly, series 4, image 98. Representative measurement 19 x 18 mm, series 4, image 98. Overall this has diminished from 2021 chest CT and favor scarring. Additional areas of subpleural nodularity within the lingula and lateral left lower lobe series 4, image 89, 93, and 97 appear tubular and may represent chronic mucoid impaction. Additional areas of mucoid impaction are seen in the right middle lobe, series 4, image 102 this was present on prior exam with overall improved nodular opacities. No significant pleural effusion. The central airways are clear Musculoskeletal: Thoracic spine assessed on thoracic spine CT earlier today. Remote right anterior rib fractures. No acute rib fracture. No acute sternal fracture. CT  ABDOMEN PELVIS FINDINGS Hepatobiliary: Motion artifact in the upper abdomen. There is no evidence of hepatic injury. Mild diffuse hepatic steatosis. The gallbladder is moderately distended. There is marked gallbladder wall thickening, for example series 3, image 67. Multiple punctate radiopaque densities in the gallbladder likely gallstones. Moderate pericholecystic fat stranding. No biliary dilatation. Pancreas: No evidence of injury. No ductal dilatation or inflammation. Spleen:  No evidence of injury allowing for motion. No splenomegaly or focal splenic abnormality. Adrenals/Urinary Tract: Adrenal thickening without dominant nodule or evidence of injury. No renal injury. No hydronephrosis. No evidence of renal inflammation. Left renal cyst. No further follow-up imaging is recommended. Unremarkable urinary bladder. Stomach/Bowel: No evidence of bowel injury or bowel inflammation. Stomach is decompressed. No small bowel obstruction. Normal appendix. Left colonic diverticulosis, no diverticulitis. Vascular/Lymphatic: No evidence of aortic or vascular injury. Moderate aortic atherosclerosis. No bulky abdominopelvic adenopathy. Reproductive: Status post hysterectomy. No adnexal masses. Other: Right upper quadrant fat stranding with trace free fluid. No free air. Musculoskeletal: Lumbar spine assessed on same day lumbar spine CT. No acute pelvic fracture. IMPRESSION: 1. No evidence of acute traumatic injury in the chest, abdomen, or pelvis. 2. Findings highly suspicious for acute cholecystitis. 3. Sequela of chronic lung disease, favoring chronic indolent infection. Bandlike opacity in the right lower lobe has a rounded component inferiorly. Overall this has diminished from 2021 chest CT and favor scarring. Additional areas of subpleural nodularity within the lingula and lateral left lower lobe appear tubular and may represent chronic mucoid impaction. Consider CT follow-up in 3 months to assess for stability. Aortic  Atherosclerosis (ICD10-I70.0). Electronically Signed   By: Chadwick Colonel M.D.   On: 01/27/2024 19:58   CT Lumbar Spine Wo Contrast Result Date: 01/27/2024 CLINICAL DATA:  Back trauma, no prior imaging (Age >= 16y).  Fall. EXAM: CT LUMBAR SPINE WITHOUT CONTRAST TECHNIQUE: Multidetector CT imaging of the lumbar spine was performed without intravenous contrast administration. Multiplanar CT image reconstructions were also generated. RADIATION DOSE REDUCTION: This exam was performed according to the departmental dose-optimization program which includes automated exposure control, adjustment of the mA and/or kV according to patient size and/or use of iterative reconstruction technique. COMPARISON:  None Available. FINDINGS: Segmentation: 5 lumbar type vertebrae. Alignment: Convex rightward scoliosis. No subluxation. 4 mm degenerative anterolisthesis of L4 on L5. 2 mm degenerative anterolisthesis of L3 on L4. Slight retrolisthesis in the upper lumbar spine. Vertebrae: No fracture or focal bone lesion. Paraspinal and other soft tissues: Negative Disc levels: Advanced diffuse degenerative disc disease and facet disease. IMPRESSION: No acute bony abnormality. Scoliosis and degenerative changes in the lumbar spine. Electronically Signed   By: Janeece Mechanic M.D.   On: 01/27/2024 17:47   CT Thoracic Spine Wo Contrast Result Date: 01/27/2024 CLINICAL DATA:  Fall, back pain EXAM: CT THORACIC SPINE WITHOUT CONTRAST TECHNIQUE: Multidetector CT images of the thoracic were obtained using the standard protocol without intravenous contrast. RADIATION DOSE REDUCTION: This exam was performed according to the departmental dose-optimization program which includes automated exposure control, adjustment of the mA and/or kV according to patient size and/or use of iterative reconstruction technique. COMPARISON:  None Available. FINDINGS: Alignment: Slight retrolisthesis of T12 on L1 related to facet disease. Vertebrae: No acute  fracture or focal pathologic process. Paraspinal and other soft tissues: Negative paraspinal soft tissues. Rounded airspace opacity in the right lower lobe along a area of platelike scarring. This could reflect nodular scarring. No effusions. Disc levels: Diffuse degenerative disc disease. No visible disc herniation. IMPRESSION: No acute bony abnormality.  Diffuse degenerative changes. Rounded opacity in the right lower lobe along an area of platelike scarring. The rounded opacity measures 2.1 x 1.7 cm. This could reflect a component of scarring, but recommend follow-up to exclude mass. Follow-up chest CT in 3-6 months recommended. Electronically Signed   By: Janeece Mechanic M.D.   On: 01/27/2024 17:44   CT CERVICAL SPINE  WO CONTRAST Result Date: 01/27/2024 CLINICAL DATA:  Polytrauma, blunt EXAM: CT CERVICAL SPINE WITHOUT CONTRAST TECHNIQUE: Multidetector CT imaging of the cervical spine was performed without intravenous contrast. Multiplanar CT image reconstructions were also generated. RADIATION DOSE REDUCTION: This exam was performed according to the departmental dose-optimization program which includes automated exposure control, adjustment of the mA and/or kV according to patient size and/or use of iterative reconstruction technique. COMPARISON:  None Available. FINDINGS: Alignment: No subluxation Skull base and vertebrae: No acute fracture. No primary bone lesion or focal pathologic process. Soft tissues and spinal canal: No prevertebral fluid or swelling. No visible canal hematoma. Disc levels: Advanced degenerative disc disease most pronounced at C5-6 and C6-7. Advanced bilateral degenerative facet disease. Multilevel bilateral neural foraminal narrowing. Upper chest: No acute findings Other: None Electronically Signed   By: Janeece Mechanic M.D.   On: 01/27/2024 17:41   CT HEAD WO CONTRAST Result Date: 01/27/2024 CLINICAL DATA:  Head trauma, moderate-severe.  Fall. EXAM: CT HEAD WITHOUT CONTRAST TECHNIQUE:  Contiguous axial images were obtained from the base of the skull through the vertex without intravenous contrast. RADIATION DOSE REDUCTION: This exam was performed according to the departmental dose-optimization program which includes automated exposure control, adjustment of the mA and/or kV according to patient size and/or use of iterative reconstruction technique. COMPARISON:  None Available. FINDINGS: Brain: Again noted is a hyperdense mass surrounding the left aneurysm clip measuring 3.6 x 3.0 x 2.4 cm, not significantly changed since prior study. This is compatible with a meningioma. Extensive encephalomalacia throughout the left cerebral hemisphere, stable. No acute infarct or hemorrhage. There is atrophy and chronic small vessel disease changes. Vascular: No hyperdense vessel.  Left aneurysm clip noted. Skull: Prior left temporal craniotomy. No acute calvarial abnormality. Sinuses/Orbits: No acute findings Other: None IMPRESSION: No acute intracranial abnormality. Stable meningioma surrounding the left aneurysm clip measuring 3.6 x 3.0 x 2.4 cm. Stable encephalomalacia throughout much of the left cerebral hemisphere. Atrophy, chronic microvascular disease. Electronically Signed   By: Janeece Mechanic M.D.   On: 01/27/2024 17:39   DG Pelvis Portable Result Date: 01/27/2024 CLINICAL DATA:  Unwitnessed fall. EXAM: PORTABLE PELVIS 1-2 VIEWS COMPARISON:  October 13, 2023. FINDINGS: There is no evidence of pelvic fracture or diastasis. No pelvic bone lesions are seen. IMPRESSION: Negative. Electronically Signed   By: Rosalene Colon M.D.   On: 01/27/2024 16:59      Assessment/Plan ***   FEN - *** VTE - *** ID - *** Admit - ***  Karleen Overall, MD [facility], Hepatobiliary and Pancreatic Surgery 01/28/24 12:00 AM

## 2024-01-28 NOTE — Progress Notes (Addendum)
 PROGRESS NOTE    Peggy Williams  GUY:[id number] DOB: [birth year] DOA: 01/27/2024 PCP: Tita Form, MD  Outpatient Specialists: cardiology    Brief Narrative:   From admission h and p Peggy Williams is a 89 y.o. female with past medical history  of  hypothyroidism and very remote aneurysm with post-procedure CVA (has chronic word-finding difficulties) brought in for a fall that was unwitnessed.  Per Peggy Williams -who is also her healthcare power of attorney patient at baseline pt uses walker and is usually mobile and walks to dining room and independent for the most part.  Patient has a history of atrial flutter and is on Eliquis  Cardizem  metoprolol  and digoxin .  Patient is also on levothyroxine  50 and Lasix  20.  HPI as per chart review and per ED MD note.    Assessment & Plan:   Principal Problem:   Fall at home, initial encounter Active Problems:   History of CVA (cerebrovascular accident)   Acquired hypothyroidism   Stage 3 chronic kidney disease (HCC)   Atrial fibrillation (HCC)   Primary hypertension  # Acute cholecystitis Stable on zosyn , gen surg following - pre-op cardiology clearance requested by gen surg, pending. I will order TTE - continue zosyn  for now  # Fall CT head, neck, and c/t/l spine all negative as was x-ray of pelvis - will need PT consult at some point  # Expressive aphasia 2/2 remote hemorrhagic stroke  # A-fib - home apixaban  on hold, will start IV heparin  in the meantime - resume home diltiazem , digoxin , metop  # Hypothyroid - home levothyroxine   # RLL nodule Incidental on CT - outpt f/u   DVT prophylaxis: IV heparin  Code Status: dnr/dni Family Communication: Williams who is hcpoa updated telephonically 5/19  Level of care: Telemetry Medical Status is: Inpatient Remains inpatient appropriate because: severity of illness    Consultants:  Gen surg, cardiology  Procedures:  pending  Antimicrobials:  zosyn     Subjective: Stable ruq  pain  Objective: Vitals:   01/27/24 2252 01/28/24 0319 01/28/24 0727 01/28/24 1427  BP: 138/87 (!) 174/79 (!) 147/71 134/86  Pulse: 92 73 80   Resp: (!) 24 18 15 17   Temp: 98.2 F (36.8 C) 98.1 F (36.7 C) 98 F (36.7 C) 97.8 F (36.6 C)  TempSrc:  Oral Oral   SpO2: 95% 93% 91% (!) 89%  Weight:      Height:        Intake/Output Summary (Last 24 hours) at 01/28/2024 1439 Last data filed at 01/28/2024 0900 Gross per 24 hour  Intake 1336.79 ml  Output --  Net 1336.79 ml   Filed Weights   01/27/24 1653  Weight: 64.3 kg    Examination:  General exam: mild distress Respiratory system: rales at bases otherwise clear Cardiovascular system: S1 & S2 heard, appears regular Gastrointestinal system: Abdomen is nondistended, tender ruq Central nervous system: Alert with expressive aphasia otherwise nonfocal Extremities: warm Skin: discoloration lower extremities Psychiatry: calm.     Data Reviewed: I have personally reviewed following labs and imaging studies  CBC: Recent Labs  Lab 01/27/24 1648 01/27/24 1651 01/28/24 0805  WBC 24.9*  --  20.7*  NEUTROABS 21.7*  --   --   HGB 16.7* 16.7* 15.8*  HCT 48.5* 49.0* 45.9  MCV 91.0  --  91.1  PLT 283  --  233   Basic Metabolic Panel: Recent Labs  Lab 01/27/24 1648 01/27/24 1651 01/28/24 0805  NA 139 139 138  K 3.7  3.7 3.3*  CL 102 102 106  CO2 26  --  21*  GLUCOSE 120* 118* 107*  BUN 15 19 11   CREATININE 1.24* 1.30* 0.82  CALCIUM 9.8  --  8.8*   GFR: Estimated Creatinine Clearance: 37.8 mL/min (by C-G formula based on SCr of 0.82 mg/dL). Liver Function Tests: Recent Labs  Lab 01/27/24 1648 01/28/24 0805  AST 55* 32  ALT 101* 62*  ALKPHOS 114 90  BILITOT 2.4* 2.1*  PROT 6.9 5.8*  ALBUMIN 3.7 2.7*   No results for input(s): "LIPASE", "AMYLASE" in the last 168 hours. No results for input(s): "AMMONIA" in the last 168 hours. Coagulation Profile: No results for input(s): "INR", "PROTIME" in the last 168  hours. Cardiac Enzymes: No results for input(s): "CKTOTAL", "CKMB", "CKMBINDEX", "TROPONINI" in the last 168 hours. BNP (last 3 results) No results for input(s): "PROBNP" in the last 8760 hours. HbA1C: No results for input(s): "HGBA1C" in the last 72 hours. CBG: No results for input(s): "GLUCAP" in the last 168 hours. Lipid Profile: No results for input(s): "CHOL", "HDL", "LDLCALC", "TRIG", "CHOLHDL", "LDLDIRECT" in the last 72 hours. Thyroid  Function Tests: No results for input(s): "TSH", "T4TOTAL", "FREET4", "T3FREE", "THYROIDAB" in the last 72 hours. Anemia Panel: No results for input(s): "VITAMINB12", "FOLATE", "FERRITIN", "TIBC", "IRON", "RETICCTPCT" in the last 72 hours. Urine analysis:    Component Value Date/Time   COLORURINE YELLOW 12/13/2020 1800   APPEARANCEUR HAZY (A) 12/13/2020 1800   LABSPEC 1.012 12/13/2020 1800   PHURINE 5.0 12/13/2020 1800   GLUCOSEU NEGATIVE 12/13/2020 1800   HGBUR LARGE (A) 12/13/2020 1800   BILIRUBINUR NEGATIVE 12/13/2020 1800   KETONESUR 20 (A) 12/13/2020 1800   PROTEINUR 100 (A) 12/13/2020 1800   NITRITE NEGATIVE 12/13/2020 1800   LEUKOCYTESUR SMALL (A) 12/13/2020 1800   Sepsis Labs: @LABRCNTIP (procalcitonin:4,lacticidven:4)  )No results found for this or any previous visit (from the past 240 hours).       Radiology Studies: CT CHEST ABDOMEN PELVIS W CONTRAST Result Date: 01/27/2024 CLINICAL DATA:  Blunt trauma.  Elevated bilirubin.  Leukocytosis. EXAM: CT CHEST, ABDOMEN, AND PELVIS WITH CONTRAST TECHNIQUE: Multidetector CT imaging of the chest, abdomen and pelvis was performed following the standard protocol during bolus administration of intravenous contrast. RADIATION DOSE REDUCTION: This exam was performed according to the departmental dose-optimization program which includes automated exposure control, adjustment of the mA and/or kV according to patient size and/or use of iterative reconstruction technique. CONTRAST:  75mL OMNIPAQUE   IOHEXOL  350 MG/ML SOLN COMPARISON:  Lumbar and thoracic spine CT performed earlier today. Chest CTA 07/17/2020. Noncontrast abdominal CT 12/13/2020 FINDINGS: CT CHEST FINDINGS Cardiovascular: No evidence of acute aortic or vascular injury. Diffuse calcified and noncalcified atheromatous plaque in the thoracic aorta the heart is mildly enlarged. There are coronary artery calcifications. No pericardial effusion. Mediastinum/Nodes: No mediastinal hemorrhage or hematoma. No mediastinal adenopathy. Patulous esophagus. No pneumomediastinum. Lungs/Pleura: No pneumothorax or pulmonary contusion. Sequela of chronic lung disease. Bandlike opacity in the right lower lobe which has a rounded component inferiorly, series 4, image 98. Representative measurement 19 x 18 mm, series 4, image 98. Overall this has diminished from 2021 chest CT and favor scarring. Additional areas of subpleural nodularity within the lingula and lateral left lower lobe series 4, image 89, 93, and 97 appear tubular and may represent chronic mucoid impaction. Additional areas of mucoid impaction are seen in the right middle lobe, series 4, image 102 this was present on prior exam with overall improved nodular opacities. No significant pleural effusion.  The central airways are clear Musculoskeletal: Thoracic spine assessed on thoracic spine CT earlier today. Remote right anterior rib fractures. No acute rib fracture. No acute sternal fracture. CT ABDOMEN PELVIS FINDINGS Hepatobiliary: Motion artifact in the upper abdomen. There is no evidence of hepatic injury. Mild diffuse hepatic steatosis. The gallbladder is moderately distended. There is marked gallbladder wall thickening, for example series 3, image 67. Multiple punctate radiopaque densities in the gallbladder likely gallstones. Moderate pericholecystic fat stranding. No biliary dilatation. Pancreas: No evidence of injury. No ductal dilatation or inflammation. Spleen: No evidence of injury allowing  for motion. No splenomegaly or focal splenic abnormality. Adrenals/Urinary Tract: Adrenal thickening without dominant nodule or evidence of injury. No renal injury. No hydronephrosis. No evidence of renal inflammation. Left renal cyst. No further follow-up imaging is recommended. Unremarkable urinary bladder. Stomach/Bowel: No evidence of bowel injury or bowel inflammation. Stomach is decompressed. No small bowel obstruction. Normal appendix. Left colonic diverticulosis, no diverticulitis. Vascular/Lymphatic: No evidence of aortic or vascular injury. Moderate aortic atherosclerosis. No bulky abdominopelvic adenopathy. Reproductive: Status post hysterectomy. No adnexal masses. Other: Right upper quadrant fat stranding with trace free fluid. No free air. Musculoskeletal: Lumbar spine assessed on same day lumbar spine CT. No acute pelvic fracture. IMPRESSION: 1. No evidence of acute traumatic injury in the chest, abdomen, or pelvis. 2. Findings highly suspicious for acute cholecystitis. 3. Sequela of chronic lung disease, favoring chronic indolent infection. Bandlike opacity in the right lower lobe has a rounded component inferiorly. Overall this has diminished from 2021 chest CT and favor scarring. Additional areas of subpleural nodularity within the lingula and lateral left lower lobe appear tubular and may represent chronic mucoid impaction. Consider CT follow-up in 3 months to assess for stability. Aortic Atherosclerosis (ICD10-I70.0). Electronically Signed   By: Chadwick Colonel M.D.   On: 01/27/2024 19:58   CT Lumbar Spine Wo Contrast Result Date: 01/27/2024 CLINICAL DATA:  Back trauma, no prior imaging (Age >= 16y).  Fall. EXAM: CT LUMBAR SPINE WITHOUT CONTRAST TECHNIQUE: Multidetector CT imaging of the lumbar spine was performed without intravenous contrast administration. Multiplanar CT image reconstructions were also generated. RADIATION DOSE REDUCTION: This exam was performed according to the departmental  dose-optimization program which includes automated exposure control, adjustment of the mA and/or kV according to patient size and/or use of iterative reconstruction technique. COMPARISON:  None Available. FINDINGS: Segmentation: 5 lumbar type vertebrae. Alignment: Convex rightward scoliosis. No subluxation. 4 mm degenerative anterolisthesis of L4 on L5. 2 mm degenerative anterolisthesis of L3 on L4. Slight retrolisthesis in the upper lumbar spine. Vertebrae: No fracture or focal bone lesion. Paraspinal and other soft tissues: Negative Disc levels: Advanced diffuse degenerative disc disease and facet disease. IMPRESSION: No acute bony abnormality. Scoliosis and degenerative changes in the lumbar spine. Electronically Signed   By: Janeece Mechanic M.D.   On: 01/27/2024 17:47   CT Thoracic Spine Wo Contrast Result Date: 01/27/2024 CLINICAL DATA:  Fall, back pain EXAM: CT THORACIC SPINE WITHOUT CONTRAST TECHNIQUE: Multidetector CT images of the thoracic were obtained using the standard protocol without intravenous contrast. RADIATION DOSE REDUCTION: This exam was performed according to the departmental dose-optimization program which includes automated exposure control, adjustment of the mA and/or kV according to patient size and/or use of iterative reconstruction technique. COMPARISON:  None Available. FINDINGS: Alignment: Slight retrolisthesis of T12 on L1 related to facet disease. Vertebrae: No acute fracture or focal pathologic process. Paraspinal and other soft tissues: Negative paraspinal soft tissues. Rounded airspace opacity in the right  lower lobe along a area of platelike scarring. This could reflect nodular scarring. No effusions. Disc levels: Diffuse degenerative disc disease. No visible disc herniation. IMPRESSION: No acute bony abnormality.  Diffuse degenerative changes. Rounded opacity in the right lower lobe along an area of platelike scarring. The rounded opacity measures 2.1 x 1.7 cm. This could reflect  a component of scarring, but recommend follow-up to exclude mass. Follow-up chest CT in 3-6 months recommended. Electronically Signed   By: Janeece Mechanic M.D.   On: 01/27/2024 17:44   CT CERVICAL SPINE WO CONTRAST Result Date: 01/27/2024 CLINICAL DATA:  Polytrauma, blunt EXAM: CT CERVICAL SPINE WITHOUT CONTRAST TECHNIQUE: Multidetector CT imaging of the cervical spine was performed without intravenous contrast. Multiplanar CT image reconstructions were also generated. RADIATION DOSE REDUCTION: This exam was performed according to the departmental dose-optimization program which includes automated exposure control, adjustment of the mA and/or kV according to patient size and/or use of iterative reconstruction technique. COMPARISON:  None Available. FINDINGS: Alignment: No subluxation Skull base and vertebrae: No acute fracture. No primary bone lesion or focal pathologic process. Soft tissues and spinal canal: No prevertebral fluid or swelling. No visible canal hematoma. Disc levels: Advanced degenerative disc disease most pronounced at C5-6 and C6-7. Advanced bilateral degenerative facet disease. Multilevel bilateral neural foraminal narrowing. Upper chest: No acute findings Other: None Electronically Signed   By: Janeece Mechanic M.D.   On: 01/27/2024 17:41   CT HEAD WO CONTRAST Result Date: 01/27/2024 CLINICAL DATA:  Head trauma, moderate-severe.  Fall. EXAM: CT HEAD WITHOUT CONTRAST TECHNIQUE: Contiguous axial images were obtained from the base of the skull through the vertex without intravenous contrast. RADIATION DOSE REDUCTION: This exam was performed according to the departmental dose-optimization program which includes automated exposure control, adjustment of the mA and/or kV according to patient size and/or use of iterative reconstruction technique. COMPARISON:  None Available. FINDINGS: Brain: Again noted is a hyperdense mass surrounding the left aneurysm clip measuring 3.6 x 3.0 x 2.4 cm, not  significantly changed since prior study. This is compatible with a meningioma. Extensive encephalomalacia throughout the left cerebral hemisphere, stable. No acute infarct or hemorrhage. There is atrophy and chronic small vessel disease changes. Vascular: No hyperdense vessel.  Left aneurysm clip noted. Skull: Prior left temporal craniotomy. No acute calvarial abnormality. Sinuses/Orbits: No acute findings Other: None IMPRESSION: No acute intracranial abnormality. Stable meningioma surrounding the left aneurysm clip measuring 3.6 x 3.0 x 2.4 cm. Stable encephalomalacia throughout much of the left cerebral hemisphere. Atrophy, chronic microvascular disease. Electronically Signed   By: Janeece Mechanic M.D.   On: 01/27/2024 17:39   DG Pelvis Portable Result Date: 01/27/2024 CLINICAL DATA:  Unwitnessed fall. EXAM: PORTABLE PELVIS 1-2 VIEWS COMPARISON:  October 13, 2023. FINDINGS: There is no evidence of pelvic fracture or diastasis. No pelvic bone lesions are seen. IMPRESSION: Negative. Electronically Signed   By: Rosalene Colon M.D.   On: 01/27/2024 16:59        Scheduled Meds:  pantoprazole  (PROTONIX ) IV  40 mg Intravenous Q24H   sodium chloride  flush  10-40 mL Intracatheter Q12H   sodium chloride  flush  3 mL Intravenous Q12H   sodium chloride  flush  3-10 mL Intravenous Q12H   Continuous Infusions:  dextrose  5 % and 0.9 % NaCl 50 mL/hr at 01/27/24 2301   piperacillin -tazobactam 3.375 g (01/28/24 1320)     LOS: 1 day     Raymonde Calico, MD [facility]   If 7PM-7AM, please contact night-coverage  [email or web address] Password [email or web address] 01/28/2024, 2:39 PM

## 2024-01-28 NOTE — Consult Note (Addendum)
 Cardiology Consultation   Patient ID: [name] MRN: [id number]; DOB: 1929-02-06  Admit date: 01/27/2024 Date of Consult: 01/28/2024  PCP:  Tita Form, MD    [facility] Providers Cardiologist:  Dorothye Gathers, MD     Patient Profile:   Peggy Williams is a 89 y.o. female with a hx of paroxsymal atrial fibrillation, remote hx of brain aneurysm with post procedure CVA bleed s/p coiling, hypothyroidism who is being seen 01/28/2024 for the evaluation of pre op evaluation at the request of Dr. Sari Cunning.  History of Present Illness:   Peggy Williams is a 89 yo female with PMH noted above. She was previously followed by Dr. Ardell Beauvais as well as the afib clinic.  Previously seen by Tera Fellows 09/2020 after recent ER visit where she was found to be in A-fib RVR and underwent DCCV.  She was noted to be back in atrial fibrillation at her follow-up visit and complained of fatigue.  She was continued on Cardizem  240 mg daily as well as metoprolol  12.5 mg XL added.  Did not want to pursue amiodarone secondary to groundglass opacities noted on prior CT chest and hypothyroidism.  Presented to the ED on 5/18 from a nursing facility after she was found on the ground after an unwitnessed fall.  She was initially unable to give any information but workup revealed leukocytosis.    Labs on admission showed sodium 139, potassium 3.7, creatinine 1.2, AST 55, ALT 101, procalcitonin 0.59, WBC 24.9, hemoglobin 16.7.  EKG showed rate controlled atrial fibrillation. Underwent CT scanning notable for cholecystitis with some necrotic component.  She was also found to be tachycardic and initiated on sepsis protocol.  Seen by general surgery noting cholecystectomy would be the most definitive treatment but may need to consider percutaneous chole drain.   Cardiology asked to evaluate for preop evaluation in the setting surgery is indicated.   In talking with her family at the bedside, she is quite functional for her  age. Mostly cares for herself independently at the ALF. Staff helps with minor things. She freq goes out to eat with family. Was in her usual state of health up until admission. Had baseline issues with aphasia from her prior stroke but mobility is reasonable good with a walker.    Past Medical History:  Diagnosis Date   A-fib [facility])    Aphasia    Arthritis    Cataract    Macular degeneration    Stroke [facility])    complication of aneurysm repair   Stroke (HCC)    Thyroid  disease     Past Surgical History:  Procedure Laterality Date   ABDOMINAL HYSTERECTOMY     BRAIN SURGERY     EYE SURGERY     JOINT REPLACEMENT      Inpatient Medications: Scheduled Meds:  pantoprazole  (PROTONIX ) IV  40 mg Intravenous Q24H   sodium chloride  flush  10-40 mL Intracatheter Q12H   sodium chloride  flush  3 mL Intravenous Q12H   sodium chloride  flush  3-10 mL Intravenous Q12H   Continuous Infusions:  dextrose  5 % and 0.9 % NaCl 50 mL/hr at 01/27/24 2301   piperacillin -tazobactam 3.375 g (01/28/24 1320)   PRN Meds: acetaminophen  **OR** acetaminophen , morphine  injection, sodium chloride  flush, sodium chloride  flush  Allergies:   No Known Allergies  Social History:   Social History   Socioeconomic History   Marital status: Widowed    Spouse name: Not on file   Number of children: Not on file  Years of education: Not on file   Highest education level: Not on file  Occupational History   Occupation: retired  Tobacco Use   Smoking status: Former    Current packs/day: 0.00    Average packs/day: 1 pack/day for 45.0 years (45.0 ttl pk-yrs)    Types: Cigarettes    Start date: 60    Quit date: 2019    Years since quitting: 6.3   Smokeless tobacco: Never  Vaping Use   Vaping status: Never Used  Substance and Sexual Activity   Alcohol use: Yes   Drug use: Not Currently   Sexual activity: Never  Other Topics Concern   Not on file  Social History Narrative   ** Merged History Encounter **        Social Drivers of Health   Financial Resource Strain: Not on file  Food Insecurity: No Food Insecurity (01/27/2024)   Hunger Vital Sign    Worried About Running Out of Food in the Last Year: Never true    Ran Out of Food in the Last Year: Never true  Transportation Needs: No Transportation Needs (01/27/2024)   PRAPARE - Administrator, Civil Service (Medical): No    Lack of Transportation (Non-Medical): No  Physical Activity: Not on file  Stress: Not on file  Social Connections: Moderately Isolated (01/27/2024)   Social Connection and Isolation Panel [NHANES]    Frequency of Communication with Friends and Family: More than three times a week    Frequency of Social Gatherings with Friends and Family: More than three times a week    Attends Religious Services: Never    Database administrator or Organizations: No    Attends Banker Meetings: 1 to 4 times per year    Marital Status: Widowed  Intimate Partner Violence: Not At Risk (01/27/2024)   Humiliation, Afraid, Rape, and Kick questionnaire    Fear of Current or Ex-Partner: No    Emotionally Abused: No    Physically Abused: No    Sexually Abused: No    Family History:   History reviewed. No pertinent family history.   ROS:  Please see the history of present illness.   All other ROS reviewed and negative.     Physical Exam/Data:   Vitals:   01/27/24 2252 01/28/24 0319 01/28/24 0727 01/28/24 1427  BP: 138/87 (!) 174/79 (!) 147/71 134/86  Pulse: 92 73 80   Resp: (!) 24 18 15 17   Temp: 98.2 F (36.8 C) 98.1 F (36.7 C) 98 F (36.7 C) 97.8 F (36.6 C)  TempSrc:  Oral Oral   SpO2: 95% 93% 91% (!) 89%  Weight:      Height:        Intake/Output Summary (Last 24 hours) at 01/28/2024 1435 Last data filed at 01/28/2024 0900 Gross per 24 hour  Intake 1336.79 ml  Output --  Net 1336.79 ml      01/27/2024    4:53 PM 10/13/2023    7:58 PM 07/27/2022    1:14 PM  Last 3 Weights  Weight (lbs)  141 lb 12.1 oz 141 lb 12.1 oz 141 lb 12.8 oz  Weight (kg) 64.3 kg 64.3 kg 64.32 kg     Body mass index is 24.33 kg/m.  General:  Thin, older female laying in bed, though does appear uncomfortable when awakened  HEENT: normal Neck: no JVD Vascular: No carotid bruits; Distal pulses 2+ bilaterally Cardiac:  normal S1, S2; IRREG IRREG; no murmur  Lungs:  slightly diminished in the bases Abd: soft, tender on palpation  Ext: no edema Musculoskeletal:  No deformities Skin: warm and dry  Neuro:  no focal abnormalities noted, aphasic at baseline  EKG:  The EKG was personally reviewed and demonstrates:  rate controlled atrial fibrillation Telemetry:  Telemetry was personally reviewed and demonstrates:  Atrial fibrillation rates 100-130s at times   Relevant CV Studies:  Echo: 07/2020  IMPRESSIONS     1. Left ventricular ejection fraction, by estimation, is 65 to 70%. The  left ventricle has normal function. The left ventricle has no regional  wall motion abnormalities. Left ventricular diastolic function could not  be evaluated.   2. Right ventricular systolic function is normal. The right ventricular  size is normal. There is mildly elevated pulmonary artery systolic  pressure. The estimated right ventricular systolic pressure is 40.5 mmHg.   3. Left atrial size was mildly dilated.   4. The mitral valve is normal in structure. Mild to moderate mitral valve  regurgitation. No evidence of mitral stenosis.   5. Tricuspid valve regurgitation is moderate.   6. The aortic valve is normal in structure. Aortic valve regurgitation is  trivial. Mild to moderate aortic valve sclerosis/calcification is present,  without any evidence of aortic stenosis.   7. The inferior vena cava is normal in size with greater than 50%  respiratory variability, suggesting right atrial pressure of 3 mmHg.   FINDINGS   Left Ventricle: Left ventricular ejection fraction, by estimation, is 65  to 70%. The left  ventricle has normal function. The left ventricle has no  regional wall motion abnormalities. The left ventricular internal cavity  size was normal in size. There is   no left ventricular hypertrophy. Left ventricular diastolic function  could not be evaluated due to atrial fibrillation. Left ventricular  diastolic function could not be evaluated.   Right Ventricle: The right ventricular size is normal. No increase in  right ventricular wall thickness. Right ventricular systolic function is  normal. There is mildly elevated pulmonary artery systolic pressure. The  tricuspid regurgitant velocity is 3.06   m/s, and with an assumed right atrial pressure of 3 mmHg, the estimated  right ventricular systolic pressure is 40.5 mmHg.   Left Atrium: Left atrial size was mildly dilated.   Right Atrium: Right atrial size was normal in size.   Pericardium: There is no evidence of pericardial effusion.   Mitral Valve: The mitral valve is normal in structure. Mild to moderate  mitral valve regurgitation. No evidence of mitral valve stenosis.   Tricuspid Valve: The tricuspid valve is normal in structure. Tricuspid  valve regurgitation is moderate . No evidence of tricuspid stenosis.   Aortic Valve: The aortic valve is normal in structure. Aortic valve  regurgitation is trivial. Mild to moderate aortic valve  sclerosis/calcification is present, without any evidence of aortic  stenosis.   Pulmonic Valve: The pulmonic valve was normal in structure. Pulmonic valve  regurgitation is mild. No evidence of pulmonic stenosis.   Aorta: The aortic root is normal in size and structure.   Venous: The inferior vena cava is normal in size with greater than 50%  respiratory variability, suggesting right atrial pressure of 3 mmHg.   IAS/Shunts: No atrial level shunt detected by color flow Doppler.   Laboratory Data:  High Sensitivity Troponin:  No results for input(s): "TROPONINIHS" in the last 720 hours.    Chemistry Recent Labs  Lab 01/27/24 1648 01/27/24 1651 01/28/24 0805  NA  139 139 138  K 3.7 3.7 3.3*  CL 102 102 106  CO2 26  --  21*  GLUCOSE 120* 118* 107*  BUN 15 19 11   CREATININE 1.24* 1.30* 0.82  CALCIUM 9.8  --  8.8*  GFRNONAA 41*  --  >60  ANIONGAP 11  --  11    Recent Labs  Lab 01/27/24 1648 01/28/24 0805  PROT 6.9 5.8*  ALBUMIN 3.7 2.7*  AST 55* 32  ALT 101* 62*  ALKPHOS 114 90  BILITOT 2.4* 2.1*   Lipids No results for input(s): "CHOL", "TRIG", "HDL", "LABVLDL", "LDLCALC", "CHOLHDL" in the last 168 hours.  Hematology Recent Labs  Lab 01/27/24 1648 01/27/24 1651 01/28/24 0805  WBC 24.9*  --  20.7*  RBC 5.33*  --  5.04  HGB 16.7* 16.7* 15.8*  HCT 48.5* 49.0* 45.9  MCV 91.0  --  91.1  MCH 31.3  --  31.3  MCHC 34.4  --  34.4  RDW 14.0  --  14.0  PLT 283  --  233   Thyroid  No results for input(s): "TSH", "FREET4" in the last 168 hours.  BNPNo results for input(s): "BNP", "PROBNP" in the last 168 hours.  DDimer No results for input(s): "DDIMER" in the last 168 hours.   Radiology/Studies:  CT CHEST ABDOMEN PELVIS W CONTRAST Result Date: 01/27/2024 CLINICAL DATA:  Blunt trauma.  Elevated bilirubin.  Leukocytosis. EXAM: CT CHEST, ABDOMEN, AND PELVIS WITH CONTRAST TECHNIQUE: Multidetector CT imaging of the chest, abdomen and pelvis was performed following the standard protocol during bolus administration of intravenous contrast. RADIATION DOSE REDUCTION: This exam was performed according to the departmental dose-optimization program which includes automated exposure control, adjustment of the mA and/or kV according to patient size and/or use of iterative reconstruction technique. CONTRAST:  75mL OMNIPAQUE  IOHEXOL  350 MG/ML SOLN COMPARISON:  Lumbar and thoracic spine CT performed earlier today. Chest CTA 07/17/2020. Noncontrast abdominal CT 12/13/2020 FINDINGS: CT CHEST FINDINGS Cardiovascular: No evidence of acute aortic or vascular injury. Diffuse calcified and  noncalcified atheromatous plaque in the thoracic aorta the heart is mildly enlarged. There are coronary artery calcifications. No pericardial effusion. Mediastinum/Nodes: No mediastinal hemorrhage or hematoma. No mediastinal adenopathy. Patulous esophagus. No pneumomediastinum. Lungs/Pleura: No pneumothorax or pulmonary contusion. Sequela of chronic lung disease. Bandlike opacity in the right lower lobe which has a rounded component inferiorly, series 4, image 98. Representative measurement 19 x 18 mm, series 4, image 98. Overall this has diminished from 2021 chest CT and favor scarring. Additional areas of subpleural nodularity within the lingula and lateral left lower lobe series 4, image 89, 93, and 97 appear tubular and may represent chronic mucoid impaction. Additional areas of mucoid impaction are seen in the right middle lobe, series 4, image 102 this was present on prior exam with overall improved nodular opacities. No significant pleural effusion. The central airways are clear Musculoskeletal: Thoracic spine assessed on thoracic spine CT earlier today. Remote right anterior rib fractures. No acute rib fracture. No acute sternal fracture. CT ABDOMEN PELVIS FINDINGS Hepatobiliary: Motion artifact in the upper abdomen. There is no evidence of hepatic injury. Mild diffuse hepatic steatosis. The gallbladder is moderately distended. There is marked gallbladder wall thickening, for example series 3, image 67. Multiple punctate radiopaque densities in the gallbladder likely gallstones. Moderate pericholecystic fat stranding. No biliary dilatation. Pancreas: No evidence of injury. No ductal dilatation or inflammation. Spleen: No evidence of injury allowing for motion. No splenomegaly or focal splenic abnormality. Adrenals/Urinary Tract: Adrenal thickening without  dominant nodule or evidence of injury. No renal injury. No hydronephrosis. No evidence of renal inflammation. Left renal cyst. No further follow-up imaging  is recommended. Unremarkable urinary bladder. Stomach/Bowel: No evidence of bowel injury or bowel inflammation. Stomach is decompressed. No small bowel obstruction. Normal appendix. Left colonic diverticulosis, no diverticulitis. Vascular/Lymphatic: No evidence of aortic or vascular injury. Moderate aortic atherosclerosis. No bulky abdominopelvic adenopathy. Reproductive: Status post hysterectomy. No adnexal masses. Other: Right upper quadrant fat stranding with trace free fluid. No free air. Musculoskeletal: Lumbar spine assessed on same day lumbar spine CT. No acute pelvic fracture. IMPRESSION: 1. No evidence of acute traumatic injury in the chest, abdomen, or pelvis. 2. Findings highly suspicious for acute cholecystitis. 3. Sequela of chronic lung disease, favoring chronic indolent infection. Bandlike opacity in the right lower lobe has a rounded component inferiorly. Overall this has diminished from 2021 chest CT and favor scarring. Additional areas of subpleural nodularity within the lingula and lateral left lower lobe appear tubular and may represent chronic mucoid impaction. Consider CT follow-up in 3 months to assess for stability. Aortic Atherosclerosis (ICD10-I70.0). Electronically Signed   By: Chadwick Colonel M.D.   On: 01/27/2024 19:58   CT Lumbar Spine Wo Contrast Result Date: 01/27/2024 CLINICAL DATA:  Back trauma, no prior imaging (Age >= 16y).  Fall. EXAM: CT LUMBAR SPINE WITHOUT CONTRAST TECHNIQUE: Multidetector CT imaging of the lumbar spine was performed without intravenous contrast administration. Multiplanar CT image reconstructions were also generated. RADIATION DOSE REDUCTION: This exam was performed according to the departmental dose-optimization program which includes automated exposure control, adjustment of the mA and/or kV according to patient size and/or use of iterative reconstruction technique. COMPARISON:  None Available. FINDINGS: Segmentation: 5 lumbar type vertebrae. Alignment:  Convex rightward scoliosis. No subluxation. 4 mm degenerative anterolisthesis of L4 on L5. 2 mm degenerative anterolisthesis of L3 on L4. Slight retrolisthesis in the upper lumbar spine. Vertebrae: No fracture or focal bone lesion. Paraspinal and other soft tissues: Negative Disc levels: Advanced diffuse degenerative disc disease and facet disease. IMPRESSION: No acute bony abnormality. Scoliosis and degenerative changes in the lumbar spine. Electronically Signed   By: Janeece Mechanic M.D.   On: 01/27/2024 17:47   CT Thoracic Spine Wo Contrast Result Date: 01/27/2024 CLINICAL DATA:  Fall, back pain EXAM: CT THORACIC SPINE WITHOUT CONTRAST TECHNIQUE: Multidetector CT images of the thoracic were obtained using the standard protocol without intravenous contrast. RADIATION DOSE REDUCTION: This exam was performed according to the departmental dose-optimization program which includes automated exposure control, adjustment of the mA and/or kV according to patient size and/or use of iterative reconstruction technique. COMPARISON:  None Available. FINDINGS: Alignment: Slight retrolisthesis of T12 on L1 related to facet disease. Vertebrae: No acute fracture or focal pathologic process. Paraspinal and other soft tissues: Negative paraspinal soft tissues. Rounded airspace opacity in the right lower lobe along a area of platelike scarring. This could reflect nodular scarring. No effusions. Disc levels: Diffuse degenerative disc disease. No visible disc herniation. IMPRESSION: No acute bony abnormality.  Diffuse degenerative changes. Rounded opacity in the right lower lobe along an area of platelike scarring. The rounded opacity measures 2.1 x 1.7 cm. This could reflect a component of scarring, but recommend follow-up to exclude mass. Follow-up chest CT in 3-6 months recommended. Electronically Signed   By: Janeece Mechanic M.D.   On: 01/27/2024 17:44   CT CERVICAL SPINE WO CONTRAST Result Date: 01/27/2024 CLINICAL DATA:   Polytrauma, blunt EXAM: CT CERVICAL SPINE WITHOUT CONTRAST TECHNIQUE:  Multidetector CT imaging of the cervical spine was performed without intravenous contrast. Multiplanar CT image reconstructions were also generated. RADIATION DOSE REDUCTION: This exam was performed according to the departmental dose-optimization program which includes automated exposure control, adjustment of the mA and/or kV according to patient size and/or use of iterative reconstruction technique. COMPARISON:  None Available. FINDINGS: Alignment: No subluxation Skull base and vertebrae: No acute fracture. No primary bone lesion or focal pathologic process. Soft tissues and spinal canal: No prevertebral fluid or swelling. No visible canal hematoma. Disc levels: Advanced degenerative disc disease most pronounced at C5-6 and C6-7. Advanced bilateral degenerative facet disease. Multilevel bilateral neural foraminal narrowing. Upper chest: No acute findings Other: None Electronically Signed   By: Janeece Mechanic M.D.   On: 01/27/2024 17:41   CT HEAD WO CONTRAST Result Date: 01/27/2024 CLINICAL DATA:  Head trauma, moderate-severe.  Fall. EXAM: CT HEAD WITHOUT CONTRAST TECHNIQUE: Contiguous axial images were obtained from the base of the skull through the vertex without intravenous contrast. RADIATION DOSE REDUCTION: This exam was performed according to the departmental dose-optimization program which includes automated exposure control, adjustment of the mA and/or kV according to patient size and/or use of iterative reconstruction technique. COMPARISON:  None Available. FINDINGS: Brain: Again noted is a hyperdense mass surrounding the left aneurysm clip measuring 3.6 x 3.0 x 2.4 cm, not significantly changed since prior study. This is compatible with a meningioma. Extensive encephalomalacia throughout the left cerebral hemisphere, stable. No acute infarct or hemorrhage. There is atrophy and chronic small vessel disease changes. Vascular: No  hyperdense vessel.  Left aneurysm clip noted. Skull: Prior left temporal craniotomy. No acute calvarial abnormality. Sinuses/Orbits: No acute findings Other: None IMPRESSION: No acute intracranial abnormality. Stable meningioma surrounding the left aneurysm clip measuring 3.6 x 3.0 x 2.4 cm. Stable encephalomalacia throughout much of the left cerebral hemisphere. Atrophy, chronic microvascular disease. Electronically Signed   By: Janeece Mechanic M.D.   On: 01/27/2024 17:39   DG Pelvis Portable Result Date: 01/27/2024 CLINICAL DATA:  Unwitnessed fall. EXAM: PORTABLE PELVIS 1-2 VIEWS COMPARISON:  October 13, 2023. FINDINGS: There is no evidence of pelvic fracture or diastasis. No pelvic bone lesions are seen. IMPRESSION: Negative. Electronically Signed   By: Rosalene Colon M.D.   On: 01/27/2024 16:59     Assessment and Plan:   Peggy Williams is a 89 y.o. female with a hx of paroxsymal atrial fibrillation, remote hx of brain aneurysm with post procedure CVA bleed s/p coiling, hypothyroidism who is being seen 01/28/2024 for the evaluation of pre op evaluation at the request of Dr. Sari Cunning.  Pre op evaluation Fall  Acute cholecystitis -- presented with a fall from ALF but found to have acute cholecystitis. General surgery following in the event that lap chole is needed, cardiology asked to evaluate. In regards to her cardiac hx, she has only been followed for atrial fibrillation. Quite functional for her advanced age, which is her most concerning risk factor.  -- RCRI score of 1, 0.9% -- check echo to ensure no significant LV dysfunction or high risk valvular disease, but do not anticipate much more that can be done to mitigate her risk at this time  Paroxsymal atrial fibrillation -- noted in atrial fibrillation on admission, rates have been elevated at times (suspect 2/2 to pain) -- on Diltiazem  240mg  daily, Toprol  XL 12.5mg  daily, dig 0.125mg  daily  -- Eliquis  on hold with potential surgery   Per  Primary Hx of brain aneurysm/CVA Hypothyroidism Expressive  aphasia   Risk Assessment/Risk Scores:   CHA2DS2-VASc Score = 7  This indicates a 11.2% annual risk of stroke. The patient's score is based upon: CHF History: 0 HTN History: 1 Diabetes History: 0 Stroke History: 2 Vascular Disease History: 1 Age Score: 2 Gender Score: 1   For questions or updates, please contact [facility] Please consult [email or web address] for contact info under    Signed, Johnie Nailer, NP  01/28/2024 2:35 PM  Personally seen and examined. Agree with above.  89 year old preoperative risk stratification for laparoscopic cholecystectomy in the setting of acute cholecystitis with leukocytosis.  Has atrial fibrillation.  Resting comfortably in bed.  Easily arousable.  Her daughter is in room.  Irregularly irregular rate, laying flat comfortably.  No significant murmurs appreciated.  Preoperative risk - Moderate risk from a cardiac perspective based mainly upon age.  She may proceed with laparoscopic surgery. -We will obtain echocardiogram however if surgery is indicated sooner rather than later, she may proceed. Spoke with her daughter most recent echocardiogram in 2021 showed normal ejection fraction of 65 to 70%.  Paroxysmal atrial fibrillation - Currently in atrial fibrillation, heart rates occasionally elevated likely secondary to pain response.  On diltiazem  240 mg a day, Toprol -XL 12.5 mg a day, digoxin  0.125 mg a day. -Holding Eliquis  for potential surgery.  Dorothye Gathers, MD

## 2024-01-28 NOTE — Consult Note (Signed)
 Peggy Williams [birth year]  [id number].    Requesting MD: Arminda Landmark, MD Chief Complaint/Reason for Consult: cholecystitis  HPI:  Peggy Williams is a 89 yo female who presented to the ED after a fall. She lives at an assisted living facility and was found on the ground. She is only able to provide limited history due to aphasia, thus the history was obtained from her daughter-in-law. The patient underwent a trauma evaluation in the ED, which did not show any acute injuries. However on CT scan she had significant gallbladder wall thickening with stranding and gallstones, consistent with acute cholecystitis. On further questioning the patient did endorse abdominal pain. General surgery was consulted. Her WBC is 24 and bilirubin is elevated to 2.4.   Her only previous documented abdominal surgery is a hysterectomy. She is on Eliquis  for a-fib. She has a remote history of aneurysm with CVA and has expressive aphasia.   ROS: Review of Systems  Constitutional:  Negative for chills and fever.  Gastrointestinal:  Positive for abdominal pain.  Musculoskeletal:  Positive for joint pain.    History reviewed. No pertinent family history.  Past Medical History:  Diagnosis Date   A-fib [facility])    Aphasia    Arthritis    Cataract    Macular degeneration    Stroke [facility])    complication of aneurysm repair   Stroke [facility])    Thyroid  disease     Past Surgical History:  Procedure Laterality Date   ABDOMINAL HYSTERECTOMY     BRAIN SURGERY     EYE SURGERY     JOINT REPLACEMENT      Social History:  reports that she quit smoking about 6 years ago. Her smoking use included cigarettes. She started smoking about 51 years ago. She has a 45 pack-year smoking history. She has never used smokeless tobacco. She reports current alcohol use. She reports that she does not currently use drugs.  Allergies: No Known Allergies  Medications Prior to Admission  Medication Sig Dispense Refill   apixaban   (ELIQUIS ) 5 MG TABS tablet Take 1 tablet (5 mg total) by mouth 2 (two) times daily. 180 tablet 1   digoxin  (LANOXIN ) 0.125 MG tablet Take 1 tablet (0.125 mg total) by mouth daily. Please make overdue appt with Dr. Ardell Beauvais before anymore refills. Thank you 1st attempt 30 tablet 0   diltiazem  (CARDIZEM  CD) 240 MG 24 hr capsule Take 1 capsule (240 mg total) by mouth daily. Pt. Needs to make an overdue appt. With Cardiologist in order to receive future refills. Thank You. 2nd Attempt. 15 capsule 0   furosemide  (LASIX ) 20 MG tablet Take 1 tablet (20 mg total) by mouth daily. (Patient not taking: Reported on 07/27/2022) 90 tablet 3   levothyroxine  (SYNTHROID , LEVOTHROID) 50 MCG tablet Take 50 mcg by mouth daily before breakfast.     metoprolol  succinate (TOPROL -XL) 25 MG 24 hr tablet Take 0.5 tablets (12.5 mg total) by mouth in the morning and at bedtime. Please make overdue appt with Dr. Ardell Beauvais Cardiologist before anymore refills. Thank you 3rd and Final Attempt 15 tablet 0   Multiple Vitamins-Minerals (OCUVITE ADULT 50+) CAPS Take 1 capsule by mouth daily in the afternoon.     ondansetron  (ZOFRAN ) 4 MG tablet Take 1 tablet (4 mg total) by mouth every 6 (six) hours. 12 tablet 0   potassium chloride  (KLOR-CON ) 10 MEQ tablet Take one (1) tablet by mouth (10 meq) daily. 3RD FINAL   ATTEMPT. PT IS OVERDUE FOR AN  APPT. CAN OK 30 DAYS SUPPLY. PLEASE HAVE PT CALL TO SCHEDULE appointment. (Patient not taking: Reported on 07/27/2022) 15 tablet 0   Vitamin D-Vitamin K (VITAMIN K2-VITAMIN D3) 45-2000 MCG-UNIT CAPS Take 1 capsule by mouth daily in the afternoon.       Physical Exam: Blood pressure 138/87, pulse 92, temperature 98.2 F (36.8 C), resp. rate (!) 24, height 5\' 4"  (1.626 m), weight 64.3 kg, SpO2 95%. General: resting comfortably, no apparent distress Neurological: alert, expressive aphasia HEENT: normocephalic, atraumatic CV: regular rate and rhythm Respiratory: normal work of breathing Abdomen:  soft, nondistended, mild tenderness to palpation in the upper abdomen, no rebound tenderness. No surgical scars in the upper abdomen. Extremities: warm and well-perfused, no deformities Skin: warm and dry, no jaundice   Results for orders placed or performed during the hospital encounter of 01/27/24 (from the past 48 hours)  Comprehensive metabolic panel     Status: Abnormal   Collection Time: 01/27/24  4:48 PM  Result Value Ref Range   Sodium 139 135 - 145 mmol/L   Potassium 3.7 3.5 - 5.1 mmol/L   Chloride 102 98 - 111 mmol/L   CO2 26 22 - 32 mmol/L   Glucose, Bld 120 (H) 70 - 99 mg/dL    Comment: Glucose reference range applies only to samples taken after fasting for at least 8 hours.   BUN 15 8 - 23 mg/dL   Creatinine, Ser 1.61 (H) 0.44 - 1.00 mg/dL   Calcium 9.8 8.9 - 09.6 mg/dL   Total Protein 6.9 6.5 - 8.1 g/dL   Albumin 3.7 3.5 - 5.0 g/dL   AST 55 (H) 15 - 41 U/L   ALT 101 (H) 0 - 44 U/L   Alkaline Phosphatase 114 38 - 126 U/L   Total Bilirubin 2.4 (H) 0.0 - 1.2 mg/dL   GFR, Estimated 41 (L) >60 mL/min    Comment: (NOTE) Calculated using the CKD-EPI Creatinine Equation (2021)    Anion gap 11 5 - 15    Comment: Performed at [facility], [address], [place], [place] [address]  CBC WITH DIFFERENTIAL     Status: Abnormal   Collection Time: 01/27/24  4:48 PM  Result Value Ref Range   WBC 24.9 (H) 4.0 - 10.5 K/uL   RBC 5.33 (H) 3.87 - 5.11 MIL/uL   Hemoglobin 16.7 (H) 12.0 - 15.0 g/dL   HCT 98.1 (H) 19.1 - 47.8 %   MCV 91.0 80.0 - 100.0 fL   MCH 31.3 26.0 - 34.0 pg   MCHC 34.4 30.0 - 36.0 g/dL   RDW 29.5 62.1 - 30.8 %   Platelets 283 150 - 400 K/uL   nRBC 0.0 0.0 - 0.2 %   Neutrophils Relative % 87 %   Neutro Abs 21.7 (H) 1.7 - 7.7 K/uL   Lymphocytes Relative 4 %   Lymphs Abs 1.1 0.7 - 4.0 K/uL   Monocytes Relative 8 %   Monocytes Absolute 1.9 (H) 0.1 - 1.0 K/uL   Eosinophils Relative 0 %   Eosinophils Absolute 0.0 0.0 - 0.5 K/uL   Basophils Relative 0 %    Basophils Absolute 0.1 0.0 - 0.1 K/uL   Immature Granulocytes 1 %   Abs Immature Granulocytes 0.21 (H) 0.00 - 0.07 K/uL    Comment: Performed at [facility], [address] [place], [place] [address]  Procalcitonin     Status: None   Collection Time: 01/27/24  4:48 PM  Result Value Ref  Range   Procalcitonin 0.59 ng/mL    Comment:        Interpretation: PCT > 0.5 ng/mL and <= 2 ng/mL: Systemic infection (sepsis) is possible, but other conditions are known to elevate PCT as well. (NOTE)       Sepsis PCT Algorithm           Lower Respiratory Tract                                      Infection PCT Algorithm    ----------------------------     ----------------------------         PCT < 0.25 ng/mL                PCT < 0.10 ng/mL          Strongly encourage             Strongly discourage   discontinuation of antibiotics    initiation of antibiotics    ----------------------------     -----------------------------       PCT 0.25 - 0.50 ng/mL            PCT 0.10 - 0.25 ng/mL               OR       >80% decrease in PCT            Discourage initiation of                                            antibiotics      Encourage discontinuation           of antibiotics    ----------------------------     -----------------------------         PCT >= 0.50 ng/mL              PCT 0.26 - 0.50 ng/mL                AND       <80% decrease in PCT             Encourage initiation of                                             antibiotics       Encourage continuation           of antibiotics    ----------------------------     -----------------------------        PCT >= 0.50 ng/mL                  PCT > 0.50 ng/mL               AND         increase in PCT                  Strongly encourage                                      initiation of antibiotics    Strongly encourage escalation  of antibiotics                                     -----------------------------                                            PCT <= 0.25 ng/mL                                                 OR                                        > 80% decrease in PCT                                      Discontinue / Do not initiate                                             antibiotics  Performed at [facility], [address], [place], [place] [address]   I-Stat Chem 8, ED     Status: Abnormal   Collection Time: 01/27/24  4:51 PM  Result Value Ref Range   Sodium 139 135 - 145 mmol/L   Potassium 3.7 3.5 - 5.1 mmol/L   Chloride 102 98 - 111 mmol/L   BUN 19 8 - 23 mg/dL   Creatinine, Ser 6.04 (H) 0.44 - 1.00 mg/dL   Glucose, Bld 540 (H) 70 - 99 mg/dL    Comment: Glucose reference range applies only to samples taken after fasting for at least 8 hours.   Calcium, Ion 1.13 (L) 1.15 - 1.40 mmol/L   TCO2 25 22 - 32 mmol/L   Hemoglobin 16.7 (H) 12.0 - 15.0 g/dL   HCT 98.1 (H) 19.1 - 47.8 %   CT CHEST ABDOMEN PELVIS W CONTRAST Result Date: 01/27/2024 CLINICAL DATA:  Blunt trauma.  Elevated bilirubin.  Leukocytosis. EXAM: CT CHEST, ABDOMEN, AND PELVIS WITH CONTRAST TECHNIQUE: Multidetector CT imaging of the chest, abdomen and pelvis was performed following the standard protocol during bolus administration of intravenous contrast. RADIATION DOSE REDUCTION: This exam was performed according to the departmental dose-optimization program which includes automated exposure control, adjustment of the mA and/or kV according to patient size and/or use of iterative reconstruction technique. CONTRAST:  75mL OMNIPAQUE  IOHEXOL  350 MG/ML SOLN COMPARISON:  Lumbar and thoracic spine CT performed earlier today. Chest CTA 07/17/2020. Noncontrast abdominal CT 12/13/2020 FINDINGS: CT CHEST FINDINGS Cardiovascular: No evidence of acute aortic or vascular injury. Diffuse calcified and noncalcified atheromatous plaque in the thoracic aorta the heart is mildly enlarged. There are coronary artery calcifications. No  pericardial effusion. Mediastinum/Nodes: No mediastinal hemorrhage or hematoma. No mediastinal adenopathy. Patulous esophagus. No pneumomediastinum. Lungs/Pleura: No pneumothorax or pulmonary contusion. Sequela of chronic lung disease. Bandlike opacity in the right lower lobe which has a rounded component inferiorly, series 4, image 98.  Representative measurement 19 x 18 mm, series 4, image 98. Overall this has diminished from 2021 chest CT and favor scarring. Additional areas of subpleural nodularity within the lingula and lateral left lower lobe series 4, image 89, 93, and 97 appear tubular and may represent chronic mucoid impaction. Additional areas of mucoid impaction are seen in the right middle lobe, series 4, image 102 this was present on prior exam with overall improved nodular opacities. No significant pleural effusion. The central airways are clear Musculoskeletal: Thoracic spine assessed on thoracic spine CT earlier today. Remote right anterior rib fractures. No acute rib fracture. No acute sternal fracture. CT ABDOMEN PELVIS FINDINGS Hepatobiliary: Motion artifact in the upper abdomen. There is no evidence of hepatic injury. Mild diffuse hepatic steatosis. The gallbladder is moderately distended. There is marked gallbladder wall thickening, for example series 3, image 67. Multiple punctate radiopaque densities in the gallbladder likely gallstones. Moderate pericholecystic fat stranding. No biliary dilatation. Pancreas: No evidence of injury. No ductal dilatation or inflammation. Spleen: No evidence of injury allowing for motion. No splenomegaly or focal splenic abnormality. Adrenals/Urinary Tract: Adrenal thickening without dominant nodule or evidence of injury. No renal injury. No hydronephrosis. No evidence of renal inflammation. Left renal cyst. No further follow-up imaging is recommended. Unremarkable urinary bladder. Stomach/Bowel: No evidence of bowel injury or bowel inflammation. Stomach is  decompressed. No small bowel obstruction. Normal appendix. Left colonic diverticulosis, no diverticulitis. Vascular/Lymphatic: No evidence of aortic or vascular injury. Moderate aortic atherosclerosis. No bulky abdominopelvic adenopathy. Reproductive: Status post hysterectomy. No adnexal masses. Other: Right upper quadrant fat stranding with trace free fluid. No free air. Musculoskeletal: Lumbar spine assessed on same day lumbar spine CT. No acute pelvic fracture. IMPRESSION: 1. No evidence of acute traumatic injury in the chest, abdomen, or pelvis. 2. Findings highly suspicious for acute cholecystitis. 3. Sequela of chronic lung disease, favoring chronic indolent infection. Bandlike opacity in the right lower lobe has a rounded component inferiorly. Overall this has diminished from 2021 chest CT and favor scarring. Additional areas of subpleural nodularity within the lingula and lateral left lower lobe appear tubular and may represent chronic mucoid impaction. Consider CT follow-up in 3 months to assess for stability. Aortic Atherosclerosis (ICD10-I70.0). Electronically Signed   By: Chadwick Colonel M.D.   On: 01/27/2024 19:58   CT Lumbar Spine Wo Contrast Result Date: 01/27/2024 CLINICAL DATA:  Back trauma, no prior imaging (Age >= 16y).  Fall. EXAM: CT LUMBAR SPINE WITHOUT CONTRAST TECHNIQUE: Multidetector CT imaging of the lumbar spine was performed without intravenous contrast administration. Multiplanar CT image reconstructions were also generated. RADIATION DOSE REDUCTION: This exam was performed according to the departmental dose-optimization program which includes automated exposure control, adjustment of the mA and/or kV according to patient size and/or use of iterative reconstruction technique. COMPARISON:  None Available. FINDINGS: Segmentation: 5 lumbar type vertebrae. Alignment: Convex rightward scoliosis. No subluxation. 4 mm degenerative anterolisthesis of L4 on L5. 2 mm degenerative  anterolisthesis of L3 on L4. Slight retrolisthesis in the upper lumbar spine. Vertebrae: No fracture or focal bone lesion. Paraspinal and other soft tissues: Negative Disc levels: Advanced diffuse degenerative disc disease and facet disease. IMPRESSION: No acute bony abnormality. Scoliosis and degenerative changes in the lumbar spine. Electronically Signed   By: Janeece Mechanic M.D.   On: 01/27/2024 17:47   CT Thoracic Spine Wo Contrast Result Date: 01/27/2024 CLINICAL DATA:  Fall, back pain EXAM: CT THORACIC SPINE WITHOUT CONTRAST TECHNIQUE: Multidetector CT images of the thoracic were obtained  using the standard protocol without intravenous contrast. RADIATION DOSE REDUCTION: This exam was performed according to the departmental dose-optimization program which includes automated exposure control, adjustment of the mA and/or kV according to patient size and/or use of iterative reconstruction technique. COMPARISON:  None Available. FINDINGS: Alignment: Slight retrolisthesis of T12 on L1 related to facet disease. Vertebrae: No acute fracture or focal pathologic process. Paraspinal and other soft tissues: Negative paraspinal soft tissues. Rounded airspace opacity in the right lower lobe along a area of platelike scarring. This could reflect nodular scarring. No effusions. Disc levels: Diffuse degenerative disc disease. No visible disc herniation. IMPRESSION: No acute bony abnormality.  Diffuse degenerative changes. Rounded opacity in the right lower lobe along an area of platelike scarring. The rounded opacity measures 2.1 x 1.7 cm. This could reflect a component of scarring, but recommend follow-up to exclude mass. Follow-up chest CT in 3-6 months recommended. Electronically Signed   By: Janeece Mechanic M.D.   On: 01/27/2024 17:44   CT CERVICAL SPINE WO CONTRAST Result Date: 01/27/2024 CLINICAL DATA:  Polytrauma, blunt EXAM: CT CERVICAL SPINE WITHOUT CONTRAST TECHNIQUE: Multidetector CT imaging of the cervical spine  was performed without intravenous contrast. Multiplanar CT image reconstructions were also generated. RADIATION DOSE REDUCTION: This exam was performed according to the departmental dose-optimization program which includes automated exposure control, adjustment of the mA and/or kV according to patient size and/or use of iterative reconstruction technique. COMPARISON:  None Available. FINDINGS: Alignment: No subluxation Skull base and vertebrae: No acute fracture. No primary bone lesion or focal pathologic process. Soft tissues and spinal canal: No prevertebral fluid or swelling. No visible canal hematoma. Disc levels: Advanced degenerative disc disease most pronounced at C5-6 and C6-7. Advanced bilateral degenerative facet disease. Multilevel bilateral neural foraminal narrowing. Upper chest: No acute findings Other: None Electronically Signed   By: Janeece Mechanic M.D.   On: 01/27/2024 17:41   CT HEAD WO CONTRAST Result Date: 01/27/2024 CLINICAL DATA:  Head trauma, moderate-severe.  Fall. EXAM: CT HEAD WITHOUT CONTRAST TECHNIQUE: Contiguous axial images were obtained from the base of the skull through the vertex without intravenous contrast. RADIATION DOSE REDUCTION: This exam was performed according to the departmental dose-optimization program which includes automated exposure control, adjustment of the mA and/or kV according to patient size and/or use of iterative reconstruction technique. COMPARISON:  None Available. FINDINGS: Brain: Again noted is a hyperdense mass surrounding the left aneurysm clip measuring 3.6 x 3.0 x 2.4 cm, not significantly changed since prior study. This is compatible with a meningioma. Extensive encephalomalacia throughout the left cerebral hemisphere, stable. No acute infarct or hemorrhage. There is atrophy and chronic small vessel disease changes. Vascular: No hyperdense vessel.  Left aneurysm clip noted. Skull: Prior left temporal craniotomy. No acute calvarial abnormality.  Sinuses/Orbits: No acute findings Other: None IMPRESSION: No acute intracranial abnormality. Stable meningioma surrounding the left aneurysm clip measuring 3.6 x 3.0 x 2.4 cm. Stable encephalomalacia throughout much of the left cerebral hemisphere. Atrophy, chronic microvascular disease. Electronically Signed   By: Janeece Mechanic M.D.   On: 01/27/2024 17:39   DG Pelvis Portable Result Date: 01/27/2024 CLINICAL DATA:  Unwitnessed fall. EXAM: PORTABLE PELVIS 1-2 VIEWS COMPARISON:  October 13, 2023. FINDINGS: There is no evidence of pelvic fracture or diastasis. No pelvic bone lesions are seen. IMPRESSION: Negative. Electronically Signed   By: Rosalene Colon M.D.   On: 01/27/2024 16:59      Assessment/Plan 89 yo female presenting after a fall, found to have  acute cholecystitis on imaging workup. She also has a significant leukocytosis and elevated bilirubin, which is most likely secondary to acute cholecystitis rather than biliary obstruction. I discussed treatment options with patient's daughter-in-law, however the patient's son is her HCPOA. Cholecystectomy would be the most definitive treatment, however the patient is frail and has some medical comorbidities. Can consider percutaneous cholecystostomy instead, but she has gallstones and will thus be at risk for recurrent cholecystitis in the future. For tonight, will treat with antibiotics and pain control, and discuss further in the morning with patient's family prior to making a final decision regarding surgery versus cholecystostomy tube. Please hold Eliquis  for now. Continue Zosyn , and repeat LFTs in am. Surgery will follow.   Karleen Overall, MD [facility] Surgery General, Hepatobiliary and Pancreatic Surgery 01/28/24 12:00 AM

## 2024-01-28 NOTE — Progress Notes (Signed)
 PHARMACY - ANTICOAGULATION CONSULT NOTE  Pharmacy Consult for heparin  gtt Indication: atrial fibrillation  No Known Allergies  Patient Measurements: Height: 5\' 4"  (162.6 cm) Weight: 64.3 kg (141 lb 12.1 oz) IBW/kg (Calculated) : 54.7 HEPARIN  DW (KG): 64.3  Vital Signs: Temp: 97.8 F (36.6 C) (05/19 1427) Temp Source: Oral (05/19 0727) BP: 134/86 (05/19 1427) Pulse Rate: 80 (05/19 0727)  Labs: Recent Labs    01/27/24 1648 01/27/24 1651 01/28/24 0805  HGB 16.7* 16.7* 15.8*  HCT 48.5* 49.0* 45.9  PLT 283  --  233  CREATININE 1.24* 1.30* 0.82    Estimated Creatinine Clearance: 37.8 mL/min (by C-G formula based on SCr of 0.82 mg/dL).   Medical History: Past Medical History:  Diagnosis Date   A-fib [facility])    Aphasia    Arthritis    Cataract    Macular degeneration    Stroke [facility])    complication of aneurysm repair   Stroke [facility])    Thyroid  disease     Medications:  Medications Prior to Admission  Medication Sig Dispense Refill Last Dose/Taking   apixaban  (ELIQUIS ) 5 MG TABS tablet Take 1 tablet (5 mg total) by mouth 2 (two) times daily. 180 tablet 1    digoxin  (LANOXIN ) 0.125 MG tablet Take 1 tablet (0.125 mg total) by mouth daily. Please make overdue appt with Dr. Ardell Beauvais before anymore refills. Thank you 1st attempt 30 tablet 0    diltiazem  (CARDIZEM  CD) 240 MG 24 hr capsule Take 1 capsule (240 mg total) by mouth daily. Pt. Needs to make an overdue appt. With Cardiologist in order to receive future refills. Thank You. 2nd Attempt. 15 capsule 0    furosemide  (LASIX ) 20 MG tablet Take 1 tablet (20 mg total) by mouth daily. (Patient not taking: Reported on 07/27/2022) 90 tablet 3    levothyroxine  (SYNTHROID , LEVOTHROID) 50 MCG tablet Take 50 mcg by mouth daily before breakfast.      metoprolol  succinate (TOPROL -XL) 25 MG 24 hr tablet Take 0.5 tablets (12.5 mg total) by mouth in the morning and at bedtime. Please make overdue appt with Dr. Ardell Beauvais Cardiologist  before anymore refills. Thank you 3rd and Final Attempt 15 tablet 0    Multiple Vitamins-Minerals (OCUVITE ADULT 50+) CAPS Take 1 capsule by mouth daily in the afternoon.      ondansetron  (ZOFRAN ) 4 MG tablet Take 1 tablet (4 mg total) by mouth every 6 (six) hours. 12 tablet 0    potassium chloride  (KLOR-CON ) 10 MEQ tablet Take one (1) tablet by mouth (10 meq) daily. 3RD FINAL   ATTEMPT. PT IS OVERDUE FOR AN APPT. CAN OK 30 DAYS SUPPLY. PLEASE HAVE PT CALL TO SCHEDULE appointment. (Patient not taking: Reported on 07/27/2022) 15 tablet 0    Vitamin D-Vitamin K (VITAMIN K2-VITAMIN D3) 45-2000 MCG-UNIT CAPS Take 1 capsule by mouth daily in the afternoon.       Assessment: Peggy Williams admitted after a fall in an ALF. She has a PMH significant for AF on apixaban  PTA. Last dose unknown (we have not received documentation from ALF). She was admitted 5/18 ~21:00 CT head negative for acute hemorrhage  Goal of Therapy:  Heparin  level 0.3-0.7 units/ml aPTT 66-102 seconds Monitor platelets by anticoagulation protocol: Yes   Plan:  Start heparin  infusion at 900 units/hr (NO BOLUS) Check anti-Xa level and aPTT in 8 hours and daily while on heparin . May DC aPTT once HL/aPTT correlate Continue to monitor H&H and platelets Monitor for signs and symptoms of bleeding /  report any pauses in heparin  gtt to Pharmacy as soon as they happen  Thank you  Marleta Simmer BS, PharmD, BCPS Clinical Pharmacist 01/28/2024 2:53 PM  Contact: [phone] after 3 PM  "Be curious, not judgmental..." -Rumalda Counter

## 2024-01-29 ENCOUNTER — Inpatient Hospital Stay (HOSPITAL_COMMUNITY)

## 2024-01-29 ENCOUNTER — Encounter (HOSPITAL_COMMUNITY): Payer: Self-pay | Admitting: Internal Medicine

## 2024-01-29 ENCOUNTER — Ambulatory Visit: Payer: Self-pay | Admitting: General Surgery

## 2024-01-29 ENCOUNTER — Encounter (HOSPITAL_COMMUNITY)
Admission: EM | Disposition: A | Discharge status: Skilled Nursing Facility | Payer: Self-pay | Source: Skilled Nursing Facility | Attending: Internal Medicine

## 2024-01-29 ENCOUNTER — Other Ambulatory Visit: Payer: Self-pay

## 2024-01-29 DIAGNOSIS — Z01810 Encounter for preprocedural cardiovascular examination: Secondary | ICD-10-CM | POA: Diagnosis not present

## 2024-01-29 DIAGNOSIS — K810 Acute cholecystitis: Secondary | ICD-10-CM | POA: Diagnosis not present

## 2024-01-29 DIAGNOSIS — I10 Essential (primary) hypertension: Secondary | ICD-10-CM

## 2024-01-29 DIAGNOSIS — Z87891 Personal history of nicotine dependence: Secondary | ICD-10-CM

## 2024-01-29 DIAGNOSIS — Y92009 Unspecified place in unspecified non-institutional (private) residence as the place of occurrence of the external cause: Secondary | ICD-10-CM | POA: Diagnosis not present

## 2024-01-29 DIAGNOSIS — E039 Hypothyroidism, unspecified: Secondary | ICD-10-CM

## 2024-01-29 DIAGNOSIS — W19XXXA Unspecified fall, initial encounter: Secondary | ICD-10-CM | POA: Diagnosis not present

## 2024-01-29 HISTORY — PX: CHOLECYSTECTOMY: SHX55

## 2024-01-29 LAB — ECHOCARDIOGRAM COMPLETE
AR max vel: 2.43 cm2
AV Area VTI: 2.66 cm2
AV Area mean vel: 2.34 cm2
AV Mean grad: 2.3 mmHg
AV Peak grad: 4.2 mmHg
Ao pk vel: 1.03 m/s
Area-P 1/2: 4.17 cm2
Calc EF: 66.5 %
Height: 64 in
MV VTI: 3.06 cm2
S' Lateral: 2.4 cm
Single Plane A2C EF: 67.8 %
Single Plane A4C EF: 66.3 %
Weight: 2257.51 [oz_av]

## 2024-01-29 LAB — COMPREHENSIVE METABOLIC PANEL WITH GFR
ALT: 55 U/L — ABNORMAL HIGH (ref 0–44)
AST: 34 U/L (ref 15–41)
Albumin: 2.6 g/dL — ABNORMAL LOW (ref 3.5–5.0)
Alkaline Phosphatase: 124 U/L (ref 38–126)
Anion gap: 10 (ref 5–15)
BUN: 11 mg/dL (ref 8–23)
CO2: 26 mmol/L (ref 22–32)
Calcium: 8.9 mg/dL (ref 8.9–10.3)
Chloride: 103 mmol/L (ref 98–111)
Creatinine, Ser: 0.86 mg/dL (ref 0.44–1.00)
GFR, Estimated: >60 mL/min (ref >60)
Glucose, Bld: 107 mg/dL — ABNORMAL HIGH (ref 70–99)
Potassium: 3.2 mmol/L — ABNORMAL LOW (ref 3.5–5.1)
Sodium: 139 mmol/L (ref 135–145)
Total Bilirubin: 2.2 mg/dL — ABNORMAL HIGH (ref 0.0–1.2)
Total Protein: 6.1 g/dL — ABNORMAL LOW (ref 6.5–8.1)

## 2024-01-29 LAB — CBC
HCT: 45 % (ref 36.0–46.0)
Hemoglobin: 15.1 g/dL — ABNORMAL HIGH (ref 12.0–15.0)
MCH: 30.9 pg (ref 26.0–34.0)
MCHC: 33.6 g/dL (ref 30.0–36.0)
MCV: 92 fL (ref 80.0–100.0)
Platelets: 255 10*3/uL (ref 150–400)
RBC: 4.89 MIL/uL (ref 3.87–5.11)
RDW: 13.7 % (ref 11.5–15.5)
WBC: 18.2 10*3/uL — ABNORMAL HIGH (ref 4.0–10.5)
nRBC: 0 % (ref 0.0–0.2)

## 2024-01-29 LAB — APTT: aPTT: 44 s — ABNORMAL HIGH (ref 24–36)

## 2024-01-29 LAB — HEPARIN LEVEL (UNFRACTIONATED): Heparin Unfractionated: >1.1 [IU]/mL — ABNORMAL HIGH (ref 0.30–0.70)

## 2024-01-29 LAB — MAGNESIUM: Magnesium: 1.9 mg/dL (ref 1.7–2.4)

## 2024-01-29 SURGERY — LAPAROSCOPIC CHOLECYSTECTOMY
Anesthesia: General | Site: Abdomen

## 2024-01-29 MED ORDER — FENTANYL CITRATE (PF) 100 MCG/2ML IJ SOLN: 25.0000 ug | INTRAMUSCULAR | Status: DC | PRN

## 2024-01-29 MED ORDER — LIDOCAINE 2% (20 MG/ML) 5 ML SYRINGE
INTRAMUSCULAR | Status: AC
  Filled 2024-01-29: qty 5

## 2024-01-29 MED ORDER — PROPOFOL 500 MG/50ML IV EMUL
INTRAVENOUS | Status: DC | PRN
Start: 2024-01-29 — End: 2024-01-29
  Administered 2024-01-29: 125 ug/kg/min via INTRAVENOUS
  Administered 2024-01-29 (×2): 20 mg via INTRAVENOUS

## 2024-01-29 MED ORDER — BUPIVACAINE-EPINEPHRINE (PF) 0.25% -1:200000 IJ SOLN
INTRAMUSCULAR | Status: AC
Start: 2024-01-29 — End: ?
  Filled 2024-01-29: qty 30

## 2024-01-29 MED ORDER — POTASSIUM CHLORIDE CRYS ER 20 MEQ PO TBCR
40.0000 meq | EXTENDED_RELEASE_TABLET | Freq: Once | ORAL | Status: AC
  Administered 2024-01-29: 40 meq via ORAL
  Filled 2024-01-29: qty 2

## 2024-01-29 MED ORDER — ACETAMINOPHEN 500 MG PO TABS
1000.0000 mg | ORAL_TABLET | ORAL | Status: DC
  Filled 2024-01-29: qty 2

## 2024-01-29 MED ORDER — PHENYLEPHRINE 80 MCG/ML (10ML) SYRINGE FOR IV PUSH (FOR BLOOD PRESSURE SUPPORT)
PREFILLED_SYRINGE | INTRAVENOUS | Status: AC
  Filled 2024-01-29: qty 10

## 2024-01-29 MED ORDER — LACTATED RINGERS IV SOLN: INTRAVENOUS | Status: DC

## 2024-01-29 MED ORDER — ROCURONIUM BROMIDE 10 MG/ML (PF) SYRINGE
PREFILLED_SYRINGE | INTRAVENOUS | Status: AC
  Filled 2024-01-29: qty 10

## 2024-01-29 MED ORDER — POTASSIUM CHLORIDE CRYS ER 20 MEQ PO TBCR
20.0000 meq | EXTENDED_RELEASE_TABLET | Freq: Once | ORAL | Status: AC
  Administered 2024-01-29: 20 meq via ORAL
  Filled 2024-01-29: qty 1

## 2024-01-29 MED ORDER — CHLORHEXIDINE GLUCONATE 0.12 % MT SOLN: 15.0000 mL | Freq: Once | OROMUCOSAL | Status: AC

## 2024-01-29 MED ORDER — BUPIVACAINE-EPINEPHRINE (PF) 0.25% -1:200000 IJ SOLN
INTRAMUSCULAR | Status: DC | PRN
  Administered 2024-01-29: 30 mL via PERINEURAL

## 2024-01-29 MED ORDER — PROPOFOL 10 MG/ML IV BOLUS
INTRAVENOUS | Status: DC | PRN
Start: 2024-01-29 — End: 2024-01-29
  Administered 2024-01-29: 70 mg via INTRAVENOUS

## 2024-01-29 MED ORDER — 0.9 % SODIUM CHLORIDE (POUR BTL) OPTIME
TOPICAL | Status: DC | PRN
Start: 2024-01-29 — End: 2024-01-29
  Administered 2024-01-29: 1000 mL

## 2024-01-29 MED ORDER — SUGAMMADEX SODIUM 200 MG/2ML IV SOLN
INTRAVENOUS | Status: DC | PRN
  Administered 2024-01-29: 200 mg via INTRAVENOUS

## 2024-01-29 MED ORDER — DEXAMETHASONE SODIUM PHOSPHATE 10 MG/ML IJ SOLN
INTRAMUSCULAR | Status: DC | PRN
  Administered 2024-01-29: 10 mg via INTRAVENOUS

## 2024-01-29 MED ORDER — FENTANYL CITRATE (PF) 250 MCG/5ML IJ SOLN
INTRAMUSCULAR | Status: AC
  Filled 2024-01-29: qty 5

## 2024-01-29 MED ORDER — CHLORHEXIDINE GLUCONATE 0.12 % MT SOLN
OROMUCOSAL | Status: AC
Start: 2024-01-29 — End: 2024-01-29
  Administered 2024-01-29: 15 mL via OROMUCOSAL
  Filled 2024-01-29: qty 15

## 2024-01-29 MED ORDER — CHLORHEXIDINE GLUCONATE CLOTH 2 % EX PADS: 6.0000 | MEDICATED_PAD | Freq: Once | CUTANEOUS | Status: DC

## 2024-01-29 MED ORDER — ORAL CARE MOUTH RINSE: 15.0000 mL | Freq: Once | OROMUCOSAL | Status: AC

## 2024-01-29 MED ORDER — FENTANYL CITRATE (PF) 250 MCG/5ML IJ SOLN
INTRAMUSCULAR | Status: DC | PRN
  Administered 2024-01-29 (×4): 25 ug via INTRAVENOUS
  Administered 2024-01-29: 75 ug via INTRAVENOUS

## 2024-01-29 MED ORDER — PHENYLEPHRINE 80 MCG/ML (10ML) SYRINGE FOR IV PUSH (FOR BLOOD PRESSURE SUPPORT)
PREFILLED_SYRINGE | INTRAVENOUS | Status: DC | PRN
  Administered 2024-01-29: 80 ug via INTRAVENOUS

## 2024-01-29 MED ORDER — ROCURONIUM BROMIDE 10 MG/ML (PF) SYRINGE
PREFILLED_SYRINGE | INTRAVENOUS | Status: DC | PRN
  Administered 2024-01-29: 40 mg via INTRAVENOUS
  Administered 2024-01-29: 10 mg via INTRAVENOUS

## 2024-01-29 MED ORDER — PHENYLEPHRINE HCL (PRESSORS) 10 MG/ML IV SOLN
INTRAVENOUS | Status: DC | PRN
  Administered 2024-01-29: 160 ug via INTRAVENOUS

## 2024-01-29 MED ORDER — PHENYLEPHRINE HCL-NACL 20-0.9 MG/250ML-% IV SOLN
INTRAVENOUS | Status: DC | PRN
  Administered 2024-01-29: 20 ug/min via INTRAVENOUS

## 2024-01-29 MED ORDER — METOPROLOL TARTRATE 5 MG/5ML IV SOLN
INTRAVENOUS | Status: AC
  Filled 2024-01-29: qty 5

## 2024-01-29 MED ORDER — PROPOFOL 10 MG/ML IV BOLUS
INTRAVENOUS | Status: AC
  Filled 2024-01-29: qty 20

## 2024-01-29 MED ORDER — SODIUM CHLORIDE 0.9 % IR SOLN
Status: DC | PRN
  Administered 2024-01-29 (×2): 1000 mL

## 2024-01-29 MED ORDER — METOPROLOL TARTRATE 5 MG/5ML IV SOLN
2.5000 mg | Freq: Once | INTRAVENOUS | Status: AC
  Administered 2024-01-29: 2.5 mg via INTRAVENOUS

## 2024-01-29 MED ORDER — LIDOCAINE 2% (20 MG/ML) 5 ML SYRINGE
INTRAMUSCULAR | Status: DC | PRN
  Administered 2024-01-29: 40 mg via INTRAVENOUS

## 2024-01-29 MED ORDER — POTASSIUM CHLORIDE 10 MEQ/100ML IV SOLN
10.0000 meq | INTRAVENOUS | Status: AC
Start: 2024-01-29 — End: 2024-01-29

## 2024-01-29 MED ORDER — ONDANSETRON HCL 4 MG/2ML IJ SOLN
INTRAMUSCULAR | Status: DC | PRN
  Administered 2024-01-29: 4 mg via INTRAVENOUS

## 2024-01-29 SURGICAL SUPPLY — 41 items
BAG COUNTER SPONGE SURGICOUNT (BAG) ×1 IMPLANT
CANISTER SUCTION 3000ML PPV (SUCTIONS) ×1 IMPLANT
CHLORAPREP W/TINT 26 (MISCELLANEOUS) ×1 IMPLANT
CLIP APPLIE ROT 10 11.4 M/L (STAPLE) ×1 IMPLANT
COVER SURGICAL LIGHT HANDLE (MISCELLANEOUS) ×1 IMPLANT
CUTTER FLEX LINEAR 45M (STAPLE) IMPLANT
DERMABOND ADVANCED .7 DNX12 (GAUZE/BANDAGES/DRESSINGS) ×1 IMPLANT
ELECTRODE REM PT RTRN 9FT ADLT (ELECTROSURGICAL) ×1 IMPLANT
ENDOLOOP SUT PDS II 0 18 (SUTURE) IMPLANT
GLOVE BIO SURGEON STRL SZ7 (GLOVE) ×1 IMPLANT
GOWN STRL REUS W/ TWL LRG LVL3 (GOWN DISPOSABLE) ×2 IMPLANT
GOWN STRL REUS W/ TWL XL LVL3 (GOWN DISPOSABLE) ×1 IMPLANT
GRASPER SUT TROCAR 14GX15 (MISCELLANEOUS) ×1 IMPLANT
IRRIGATION SUCT STRKRFLW 2 WTP (MISCELLANEOUS) ×1 IMPLANT
KIT BASIN OR (CUSTOM PROCEDURE TRAY) ×1 IMPLANT
KIT IMAGING PINPOINTPAQ (MISCELLANEOUS) IMPLANT
KIT TURNOVER KIT B (KITS) ×1 IMPLANT
LHOOK LAP DISP 36CM (ELECTROSURGICAL) ×1 IMPLANT
NDL 22X1.5 STRL (OR ONLY) (MISCELLANEOUS) ×1 IMPLANT
NDL INSUFFLATION 14GA 120MM (NEEDLE) ×1 IMPLANT
NEEDLE 22X1.5 STRL (OR ONLY) (MISCELLANEOUS) ×1 IMPLANT
NEEDLE INSUFFLATION 14GA 120MM (NEEDLE) ×1 IMPLANT
NS IRRIG 1000ML POUR BTL (IV SOLUTION) ×1 IMPLANT
PAD ARMBOARD POSITIONER FOAM (MISCELLANEOUS) ×1 IMPLANT
PENCIL BUTTON HOLSTER BLD 10FT (ELECTRODE) ×1 IMPLANT
POUCH RETRIEVAL ECOSAC 10 (ENDOMECHANICALS) ×1 IMPLANT
RELOAD STAPLE 45 3.5 BLU ETS (ENDOMECHANICALS) IMPLANT
RELOAD STAPLE TA45 3.5 REG BLU (ENDOMECHANICALS) ×1 IMPLANT
SCISSORS LAP 5X35 DISP (ENDOMECHANICALS) ×1 IMPLANT
SET CHOLANGIOGRAPH 5 50 .035 (SET/KITS/TRAYS/PACK) IMPLANT
SET TUBE SMOKE EVAC HIGH FLOW (TUBING) ×1 IMPLANT
SLEEVE Z-THREAD 5X100MM (TROCAR) ×2 IMPLANT
SUT MNCRL AB 4-0 PS2 18 (SUTURE) ×1 IMPLANT
SUT VICRYL 0 UR6 27IN ABS (SUTURE) IMPLANT
TOWEL GREEN STERILE (TOWEL DISPOSABLE) ×1 IMPLANT
TOWEL GREEN STERILE FF (TOWEL DISPOSABLE) ×1 IMPLANT
TRAY LAPAROSCOPIC MC (CUSTOM PROCEDURE TRAY) ×1 IMPLANT
TROCAR Z THREAD OPTICAL 12X100 (TROCAR) ×1 IMPLANT
TROCAR Z-THREAD OPTICAL 5X100M (TROCAR) ×1 IMPLANT
WARMER LAPAROSCOPE (MISCELLANEOUS) ×1 IMPLANT
WATER STERILE IRR 1000ML POUR (IV SOLUTION) ×1 IMPLANT

## 2024-01-29 NOTE — Progress Notes (Signed)
 PROGRESS NOTE    Peggy Williams  ZOX:[id number] DOB: 05-25-1929 DOA: 01/27/2024 PCP: Tita Form, MD  Outpatient Specialists: cardiology    Brief Narrative:   From admission h and p Peggy Williams is a 89 y.o. female with past medical history  of  hypothyroidism and very remote aneurysm with post-procedure CVA (has chronic word-finding difficulties) brought in for a fall that was unwitnessed.  Per John son -who is also her healthcare power of attorney patient at baseline pt uses walker and is usually mobile and walks to dining room and independent for the most part.  Patient has a history of atrial flutter and is on Eliquis  Cardizem  metoprolol  and digoxin .  Patient is also on levothyroxine  50 and Lasix  20.  HPI as per chart review and per ED MD note.    Assessment & Plan:   Principal Problem:   Fall at home, initial encounter Active Problems:   History of CVA (cerebrovascular accident)   Acquired hypothyroidism   Stage 3 chronic kidney disease (HCC)   Atrial fibrillation (HCC)   Primary hypertension  # Acute cholecystitis Stable on zosyn , gen surg following - pre-op cardiology clearance requested by gen surg, echo pending, unless something marked, cleared for surgery - surgery planned for later today - continue zosyn  for now  # Fall CT head, neck, and c/t/l spine all negative as was x-ray of pelvis - PT/OT consult tomorrow  # Hypokalemia 3.2 today - replete, f/u Mg and replete if low  # Expressive aphasia 2/2 remote hemorrhagic stroke  # A-fib - home apixaban  on hold, was covered with IV heparin  overnight  - cont home diltiazem , digoxin , metop  # Hypothyroid - home levothyroxine   # RLL nodule Incidental on CT - outpt f/u   DVT prophylaxis: IV heparin  Code Status: dnr/dni Family Communication: son jim updated telephonically 5/20  Level of care: Telemetry Medical Status is: Inpatient Remains inpatient appropriate because: severity of illness    Consultants:   Gen surg, cardiology  Procedures:  pending  Antimicrobials:  zosyn     Subjective: Ruq pain improving, no complaints  Objective: Vitals:   01/29/24 0429 01/29/24 0429 01/29/24 0458 01/29/24 0730  BP:  102/74  (!) 138/103  Pulse:  99  97  Resp: 17 17  17   Temp:  98.7 F (37.1 C)  (!) 97.3 F (36.3 C)  TempSrc:  Oral    SpO2:  94%    Weight:   64 kg   Height:        Intake/Output Summary (Last 24 hours) at 01/29/2024 0917 Last data filed at 01/29/2024 0450 Gross per 24 hour  Intake 386.04 ml  Output 200 ml  Net 186.04 ml   Filed Weights   01/27/24 1653 01/29/24 0458  Weight: 64.3 kg 64 kg    Examination:  General exam: mild distress Respiratory system: rales at bases otherwise clear Cardiovascular system: S1 & S2 heard, appears regular Gastrointestinal system: Abdomen is nondistended, ruq tenderness much improved Central nervous system: Alert with expressive aphasia otherwise nonfocal Extremities: warm Skin: discoloration lower extremities Psychiatry: calm.     Data Reviewed: I have personally reviewed following labs and imaging studies  CBC: Recent Labs  Lab 01/27/24 1648 01/27/24 1651 01/28/24 0805 01/29/24 0118  WBC 24.9*  --  20.7* 18.2*  NEUTROABS 21.7*  --   --   --   HGB 16.7* 16.7* 15.8* 15.1*  HCT 48.5* 49.0* 45.9 45.0  MCV 91.0  --  91.1 92.0  PLT 283  --  233 255   Basic Metabolic Panel: Recent Labs  Lab 01/27/24 1648 01/27/24 1651 01/28/24 0805 01/29/24 0118  NA 139 139 138 139  K 3.7 3.7 3.3* 3.2*  CL 102 102 106 103  CO2 26  --  21* 26  GLUCOSE 120* 118* 107* 107*  BUN 15 19 11 11   CREATININE 1.24* 1.30* 0.82 0.86  CALCIUM 9.8  --  8.8* 8.9   GFR: Estimated Creatinine Clearance: 36 mL/min (by C-G formula based on SCr of 0.86 mg/dL). Liver Function Tests: Recent Labs  Lab 01/27/24 1648 01/28/24 0805 01/29/24 0118  AST 55* 32 34  ALT 101* 62* 55*  ALKPHOS 114 90 124  BILITOT 2.4* 2.1* 2.2*  PROT 6.9 5.8* 6.1*   ALBUMIN 3.7 2.7* 2.6*   No results for input(s): "LIPASE", "AMYLASE" in the last 168 hours. No results for input(s): "AMMONIA" in the last 168 hours. Coagulation Profile: No results for input(s): "INR", "PROTIME" in the last 168 hours. Cardiac Enzymes: No results for input(s): "CKTOTAL", "CKMB", "CKMBINDEX", "TROPONINI" in the last 168 hours. BNP (last 3 results) No results for input(s): "PROBNP" in the last 8760 hours. HbA1C: No results for input(s): "HGBA1C" in the last 72 hours. CBG: No results for input(s): "GLUCAP" in the last 168 hours. Lipid Profile: No results for input(s): "CHOL", "HDL", "LDLCALC", "TRIG", "CHOLHDL", "LDLDIRECT" in the last 72 hours. Thyroid  Function Tests: No results for input(s): "TSH", "T4TOTAL", "FREET4", "T3FREE", "THYROIDAB" in the last 72 hours. Anemia Panel: No results for input(s): "VITAMINB12", "FOLATE", "FERRITIN", "TIBC", "IRON", "RETICCTPCT" in the last 72 hours. Urine analysis:    Component Value Date/Time   COLORURINE YELLOW 12/13/2020 1800   APPEARANCEUR HAZY (A) 12/13/2020 1800   LABSPEC 1.012 12/13/2020 1800   PHURINE 5.0 12/13/2020 1800   GLUCOSEU NEGATIVE 12/13/2020 1800   HGBUR LARGE (A) 12/13/2020 1800   BILIRUBINUR NEGATIVE 12/13/2020 1800   KETONESUR 20 (A) 12/13/2020 1800   PROTEINUR 100 (A) 12/13/2020 1800   NITRITE NEGATIVE 12/13/2020 1800   LEUKOCYTESUR SMALL (A) 12/13/2020 1800   Sepsis Labs: @LABRCNTIP (procalcitonin:4,lacticidven:4)  ) Recent Results (from the past 240 hours)  Blood culture (routine x 2)     Status: None (Preliminary result)   Collection Time: 01/28/24  8:05 AM   Specimen: BLOOD LEFT HAND  Result Value Ref Range Status   Specimen Description BLOOD LEFT HAND  Final   Special Requests   Final    BOTTLES DRAWN AEROBIC AND ANAEROBIC Blood Culture results may not be optimal due to an inadequate volume of blood received in culture bottles   Culture   Final    NO GROWTH < 24 HOURS Performed at [facility], [address], [place], [place] [address]    Report Status PENDING  Incomplete  Blood culture (routine x 2)     Status: None (Preliminary result)   Collection Time: 01/28/24  8:07 AM   Specimen: BLOOD RIGHT HAND  Result Value Ref Range Status   Specimen Description BLOOD RIGHT HAND  Final   Special Requests   Final    BOTTLES DRAWN AEROBIC AND ANAEROBIC Blood Culture adequate volume   Culture   Final    NO GROWTH < 24 HOURS Performed at [facility], [address], [place], [place] [address]    Report Status PENDING  Incomplete         Radiology Studies: CT CHEST ABDOMEN PELVIS W CONTRAST Result Date: 01/27/2024 CLINICAL DATA:  Blunt trauma.  Elevated bilirubin.  Leukocytosis. EXAM: CT CHEST, ABDOMEN, AND PELVIS WITH CONTRAST TECHNIQUE: Multidetector CT imaging of the chest, abdomen and pelvis was performed following the standard protocol during bolus administration of intravenous contrast. RADIATION DOSE REDUCTION: This exam was performed according to the departmental dose-optimization program which includes automated exposure control, adjustment of the mA and/or kV according to patient size and/or use of iterative reconstruction technique. CONTRAST:  75mL OMNIPAQUE  IOHEXOL  350 MG/ML SOLN COMPARISON:  Lumbar and thoracic spine CT performed earlier today. Chest CTA 07/17/2020. Noncontrast abdominal CT 12/13/2020 FINDINGS: CT CHEST FINDINGS Cardiovascular: No evidence of acute aortic or vascular injury. Diffuse calcified and noncalcified atheromatous plaque in the thoracic aorta the heart is mildly enlarged. There are coronary artery calcifications. No pericardial effusion. Mediastinum/Nodes: No mediastinal hemorrhage or hematoma. No mediastinal adenopathy. Patulous esophagus. No pneumomediastinum. Lungs/Pleura: No pneumothorax or pulmonary contusion. Sequela of chronic lung disease. Bandlike opacity in the right lower lobe which has a rounded component inferiorly, series  4, image 98. Representative measurement 19 x 18 mm, series 4, image 98. Overall this has diminished from 2021 chest CT and favor scarring. Additional areas of subpleural nodularity within the lingula and lateral left lower lobe series 4, image 89, 93, and 97 appear tubular and may represent chronic mucoid impaction. Additional areas of mucoid impaction are seen in the right middle lobe, series 4, image 102 this was present on prior exam with overall improved nodular opacities. No significant pleural effusion. The central airways are clear Musculoskeletal: Thoracic spine assessed on thoracic spine CT earlier today. Remote right anterior rib fractures. No acute rib fracture. No acute sternal fracture. CT ABDOMEN PELVIS FINDINGS Hepatobiliary: Motion artifact in the upper abdomen. There is no evidence of hepatic injury. Mild diffuse hepatic steatosis. The gallbladder is moderately distended. There is marked gallbladder wall thickening, for example series 3, image 67. Multiple punctate radiopaque densities in the gallbladder likely gallstones. Moderate pericholecystic fat stranding. No biliary dilatation. Pancreas: No evidence of injury. No ductal dilatation or inflammation. Spleen: No evidence of injury allowing for motion. No splenomegaly or focal splenic abnormality. Adrenals/Urinary Tract: Adrenal thickening without dominant nodule or evidence of injury. No renal injury. No hydronephrosis. No evidence of renal inflammation. Left renal cyst. No further follow-up imaging is recommended. Unremarkable urinary bladder. Stomach/Bowel: No evidence of bowel injury or bowel inflammation. Stomach is decompressed. No small bowel obstruction. Normal appendix. Left colonic diverticulosis, no diverticulitis. Vascular/Lymphatic: No evidence of aortic or vascular injury. Moderate aortic atherosclerosis. No bulky abdominopelvic adenopathy. Reproductive: Status post hysterectomy. No adnexal masses. Other: Right upper quadrant fat  stranding with trace free fluid. No free air. Musculoskeletal: Lumbar spine assessed on same day lumbar spine CT. No acute pelvic fracture. IMPRESSION: 1. No evidence of acute traumatic injury in the chest, abdomen, or pelvis. 2. Findings highly suspicious for acute cholecystitis. 3. Sequela of chronic lung disease, favoring chronic indolent infection. Bandlike opacity in the right lower lobe has a rounded component inferiorly. Overall this has diminished from 2021 chest CT and favor scarring. Additional areas of subpleural nodularity within the lingula and lateral left lower lobe appear tubular and may represent chronic mucoid impaction. Consider CT follow-up in 3 months to assess for stability. Aortic Atherosclerosis (ICD10-I70.0). Electronically Signed   By: Chadwick Colonel M.D.   On: 01/27/2024 19:58   CT Lumbar Spine Wo Contrast Result Date: 01/27/2024 CLINICAL DATA:  Back trauma, no prior imaging (Age >= 16y).  Fall. EXAM: CT LUMBAR SPINE WITHOUT CONTRAST TECHNIQUE: Multidetector CT imaging of the lumbar spine  was performed without intravenous contrast administration. Multiplanar CT image reconstructions were also generated. RADIATION DOSE REDUCTION: This exam was performed according to the departmental dose-optimization program which includes automated exposure control, adjustment of the mA and/or kV according to patient size and/or use of iterative reconstruction technique. COMPARISON:  None Available. FINDINGS: Segmentation: 5 lumbar type vertebrae. Alignment: Convex rightward scoliosis. No subluxation. 4 mm degenerative anterolisthesis of L4 on L5. 2 mm degenerative anterolisthesis of L3 on L4. Slight retrolisthesis in the upper lumbar spine. Vertebrae: No fracture or focal bone lesion. Paraspinal and other soft tissues: Negative Disc levels: Advanced diffuse degenerative disc disease and facet disease. IMPRESSION: No acute bony abnormality. Scoliosis and degenerative changes in the lumbar spine.  Electronically Signed   By: Janeece Mechanic M.D.   On: 01/27/2024 17:47   CT Thoracic Spine Wo Contrast Result Date: 01/27/2024 CLINICAL DATA:  Fall, back pain EXAM: CT THORACIC SPINE WITHOUT CONTRAST TECHNIQUE: Multidetector CT images of the thoracic were obtained using the standard protocol without intravenous contrast. RADIATION DOSE REDUCTION: This exam was performed according to the departmental dose-optimization program which includes automated exposure control, adjustment of the mA and/or kV according to patient size and/or use of iterative reconstruction technique. COMPARISON:  None Available. FINDINGS: Alignment: Slight retrolisthesis of T12 on L1 related to facet disease. Vertebrae: No acute fracture or focal pathologic process. Paraspinal and other soft tissues: Negative paraspinal soft tissues. Rounded airspace opacity in the right lower lobe along a area of platelike scarring. This could reflect nodular scarring. No effusions. Disc levels: Diffuse degenerative disc disease. No visible disc herniation. IMPRESSION: No acute bony abnormality.  Diffuse degenerative changes. Rounded opacity in the right lower lobe along an area of platelike scarring. The rounded opacity measures 2.1 x 1.7 cm. This could reflect a component of scarring, but recommend follow-up to exclude mass. Follow-up chest CT in 3-6 months recommended. Electronically Signed   By: Janeece Mechanic M.D.   On: 01/27/2024 17:44   CT CERVICAL SPINE WO CONTRAST Result Date: 01/27/2024 CLINICAL DATA:  Polytrauma, blunt EXAM: CT CERVICAL SPINE WITHOUT CONTRAST TECHNIQUE: Multidetector CT imaging of the cervical spine was performed without intravenous contrast. Multiplanar CT image reconstructions were also generated. RADIATION DOSE REDUCTION: This exam was performed according to the departmental dose-optimization program which includes automated exposure control, adjustment of the mA and/or kV according to patient size and/or use of iterative  reconstruction technique. COMPARISON:  None Available. FINDINGS: Alignment: No subluxation Skull base and vertebrae: No acute fracture. No primary bone lesion or focal pathologic process. Soft tissues and spinal canal: No prevertebral fluid or swelling. No visible canal hematoma. Disc levels: Advanced degenerative disc disease most pronounced at C5-6 and C6-7. Advanced bilateral degenerative facet disease. Multilevel bilateral neural foraminal narrowing. Upper chest: No acute findings Other: None Electronically Signed   By: Janeece Mechanic M.D.   On: 01/27/2024 17:41   CT HEAD WO CONTRAST Result Date: 01/27/2024 CLINICAL DATA:  Head trauma, moderate-severe.  Fall. EXAM: CT HEAD WITHOUT CONTRAST TECHNIQUE: Contiguous axial images were obtained from the base of the skull through the vertex without intravenous contrast. RADIATION DOSE REDUCTION: This exam was performed according to the departmental dose-optimization program which includes automated exposure control, adjustment of the mA and/or kV according to patient size and/or use of iterative reconstruction technique. COMPARISON:  None Available. FINDINGS: Brain: Again noted is a hyperdense mass surrounding the left aneurysm clip measuring 3.6 x 3.0 x 2.4 cm, not significantly changed since prior study. This is compatible  with a meningioma. Extensive encephalomalacia throughout the left cerebral hemisphere, stable. No acute infarct or hemorrhage. There is atrophy and chronic small vessel disease changes. Vascular: No hyperdense vessel.  Left aneurysm clip noted. Skull: Prior left temporal craniotomy. No acute calvarial abnormality. Sinuses/Orbits: No acute findings Other: None IMPRESSION: No acute intracranial abnormality. Stable meningioma surrounding the left aneurysm clip measuring 3.6 x 3.0 x 2.4 cm. Stable encephalomalacia throughout much of the left cerebral hemisphere. Atrophy, chronic microvascular disease. Electronically Signed   By: Janeece Mechanic M.D.   On:  01/27/2024 17:39   DG Pelvis Portable Result Date: 01/27/2024 CLINICAL DATA:  Unwitnessed fall. EXAM: PORTABLE PELVIS 1-2 VIEWS COMPARISON:  October 13, 2023. FINDINGS: There is no evidence of pelvic fracture or diastasis. No pelvic bone lesions are seen. IMPRESSION: Negative. Electronically Signed   By: Rosalene Colon M.D.   On: 01/27/2024 16:59        Scheduled Meds:  acetaminophen   1,000 mg Oral On Call to OR   Chlorhexidine Gluconate Cloth  6 each Topical Once   And   Chlorhexidine Gluconate Cloth  6 each Topical Once   digoxin   0.125 mg Oral Daily   diltiazem   240 mg Oral Daily   levothyroxine   50 mcg Oral Q0600   metoprolol  succinate  12.5 mg Oral Daily   pantoprazole  (PROTONIX ) IV  40 mg Intravenous Q24H   sodium chloride  flush  10-40 mL Intracatheter Q12H   sodium chloride  flush  3 mL Intravenous Q12H   sodium chloride  flush  3-10 mL Intravenous Q12H   Continuous Infusions:  piperacillin -tazobactam 3.375 g (01/29/24 0613)   potassium chloride        LOS: 2 days     Raymonde Calico, MD [facility]   If 7PM-7AM, please contact night-coverage [email or web address] Password [email or web address] 01/29/2024, 9:17 AM

## 2024-01-29 NOTE — Anesthesia Procedure Notes (Signed)
 Procedure Name: Intubation Date/Time: 01/29/2024 1:52 PM  Performed by: Pasty Bongo, CRNAPre-anesthesia Checklist: Patient identified, Emergency Drugs available, Suction available and Patient being monitored Patient Re-evaluated:Patient Re-evaluated prior to induction Oxygen  Delivery Method: Circle system utilized Preoxygenation: Pre-oxygenation with 100% oxygen  Induction Type: IV induction Ventilation: Mask ventilation without difficulty Laryngoscope Size: Farrelly and 2 Grade View: Grade I Tube type: Oral Tube size: 7.0 mm Number of attempts: 1 Airway Equipment and Method: Stylet and Oral airway Placement Confirmation: ETT inserted through vocal cords under direct vision, positive ETCO2 and breath sounds checked- equal and bilateral Secured at: 21 cm Tube secured with: Tape Dental Injury: Teeth and Oropharynx as per pre-operative assessment

## 2024-01-29 NOTE — Progress Notes (Signed)
  Echocardiogram 2D Echocardiogram has been performed.  Toney Lizaola L Denzil Bristol RDCS 01/29/2024, 8:29 AM

## 2024-01-29 NOTE — Plan of Care (Signed)

## 2024-01-29 NOTE — Progress Notes (Signed)
 K runs were not give this AM due to OR. D/w Dr. Sari Cunning, we will give 60meq oral tonight.   Ivery Marking, PharmD, BCIDP, AAHIVP, CPP Infectious Disease Pharmacist 01/29/2024 6:29 PM

## 2024-01-29 NOTE — Anesthesia Postprocedure Evaluation (Signed)
 Anesthesia Post Note  Patient: Peggy Williams  Procedure(s) Performed: LAPAROSCOPIC CHOLECYSTECTOMY (Abdomen)     Patient location during evaluation: PACU Anesthesia Type: General Level of consciousness: awake and alert Pain management: pain level controlled Vital Signs Assessment: post-procedure vital signs reviewed and stable Respiratory status: spontaneous breathing, nonlabored ventilation, respiratory function stable and patient connected to nasal cannula oxygen  Cardiovascular status: blood pressure returned to baseline and stable Postop Assessment: no apparent nausea or vomiting Anesthetic complications: no  No notable events documented.  Last Vitals:  Vitals:   01/29/24 1615 01/29/24 1641  BP: (!) 146/82 (!) 152/75  Pulse: 98 99  Resp: 18 17  Temp: 36.5 C (P) 36.5 C  SpO2: 92% 93%    Last Pain:  Vitals:   01/29/24 1540  TempSrc:   PainSc: Asleep                 Chandlar Staebell L Kalyssa Anker

## 2024-01-29 NOTE — Op Note (Signed)
 Patient: Peggy Williams MRN: [id number] DOB: 1929/03/02 Sex: female Operation/Procedure Date: 01/27/2024 - 01/29/2024 Surgeons and Role:    * Davonna Estes Willeen Harold, MD - Primary  Pre-operative Diagnoses: Acute Cholecystitis Postoperative Diagnoses: Acute Cholecystitis  Procedure performed: Procedures:   * LAPAROSCOPIC CHOLECYSTECTOMY  Anesthesia: General endotracheal anesthesia  Indications: LAURIEANNE Williams is a 89 year old female who presented with abdominal pain. Workup was consistent with acute cholecystitis. She was evaluated by cardiology and felt to be of appropriate risk for a laparoscopic procedure. I had a long discussion with the family regarding the risks or surgery given her age, and they said that she would favor the surgery to help with her quality of life over a cholecystostomy tube. Preoperatively, I discussed in detail the risks, benefits, alternatives, and potential complications. The patient understands and requests to proceed.  Operative Findings: Significant inflammation consistent with acute cholecystitis.  Operative Narrative: The patient was positively identified and was taken to the operating room and placed supine on the operating table. A time-out was performed confirming correct patient and procedure. We also confirmed initiation of deep venous thrombosis prophylaxis and wound prophylaxis. After successful induction of general endotracheal anesthesia, the arms were carefully padded. An orogastric tube and footboard were placed. The abdomen was prepped and draped in the usual sterile surgical fashion.  We began our peritoneal access with a veress needle inserted at Palmer's point.  After aspiration showed return of air bubbles and there was a positive saline drop test, the insufflation was connected and the abdomen brought to a pressure of .  We then used an opti-view technique to place a 5mm port just to the right of midline, superior to the umbilicus.  A laparoscope was  introduced into the abdomen, and there were no signs of injury from entry.  A 12mm port was placed in the subxiphoid position.  Two additional 5mm ports were placed in the RUQ.  A 360-degree visualization with a 30-degree 5-mm laparoscope revealed grossly normal intra-abdominal contents.   The patient was placed in the head up position and tilted slightly to the left. There was significant inflammation, making it difficult to grasp the gallbladder.  A needle was used to aspirate the contents of the gallbladder, which revealed signs of infection.  The dome of the gallbladder was then grasped, elevated, and retracted anteriorly and cephalad.  The infundibulum was retracted laterally and inferiorly exposing Calot's triangle. The investing visceral peritoneal attachments overlying the infundibulum of the gallbladder were incised using the hook electrocautery and dissected free from the gallbladder itself. We soon developed two structures into the gallbladder consistent with the cystic duct and cystic artery. The loose areolar tissue around these structures was dissected free. The gallbladder was separated from the gallbladder fossa for approximately a third the distance up from the cystic plate, establishing the critical view of safety. A cholangiogram could not be performed due to the degree of inflammation. The cystic duct was dilated. I milked a stone from the duct up into the gallbladder but still did not feel that a clip would be large enough. The cystic artery was triply clipped and cut with scissors. The cystic duct was transected using a blue load from the endo stapler. The gallbladder was elevated off the gallbladder fossa using the hook Bovie. The gallbladder was then exteriorized through the subxiphoid port site using an EndoCatch bag. We reestablished pneumoperitoneum and confirmed no leakage of blood or bile. We confirmed integrity of our clips. The subhepatic space was  irrigated with warm sterile saline  and suctioned free. The 12mm port site was closed using an 0-vicryl on a suture passer.We placed 0.25% Marcaine with epinephrine  at each incision site for local anesthesia. The skin was closed using 4-0 Monocryl subcuticular suture. Dermabond was applied. The patient tolerated the procedure well, was extubated, and taken to the recovery room.  Estimated Blood Loss: Specimens: Gallbladder Implants: None Drains: None Complications: None Condition of the patient: Good, extubated Disposition: PACU  Cannon Champion Date: 01/29/2024 Time: 3:22 PM

## 2024-01-29 NOTE — Progress Notes (Signed)
 ECHO normal. EF 65-70% On her way to surgery.  See yesterday's consult note for details.  Will sign off. Please call with any questions.   Dorothye Gathers, MD

## 2024-01-29 NOTE — Progress Notes (Signed)
 Called Peggy Williams, floor RN to inquire status of Heparin  gtt. RN was not aware that a surgery time had been posted because there were no orders for surgery placed by MD. Pharmacy had placed stop time as 6am this morning, so RN stopping gtt at this time per pharmacy. Will call surgeon and confirm surgery details at this time.

## 2024-01-29 NOTE — Transfer of Care (Signed)
 Immediate Anesthesia Transfer of Care Note  Patient: Peggy Williams  Procedure(s) Performed: LAPAROSCOPIC CHOLECYSTECTOMY (Abdomen)  Patient Location: PACU  Anesthesia Type:General  Level of Consciousness: sleeping  Airway & Oxygen  Therapy: Patient connected to face mask oxygen   Post-op Assessment: Report given to RN  Post vital signs: Reviewed and stable  Last Vitals:  Vitals Value Taken Time  BP 159/79 01/29/24 1540  Temp 36.5 C 01/29/24 1540  Pulse 114 01/29/24 1541  Resp 23 01/29/24 1542  SpO2 99 % 01/29/24 1541  Vitals shown include unfiled device data.  Last Pain:  Vitals:   01/29/24 0958  TempSrc:   PainSc: 0-No pain         Complications: No notable events documented.

## 2024-01-29 NOTE — Anesthesia Preprocedure Evaluation (Signed)
 Anesthesia Evaluation  Patient identified by MRN, date of birth, ID band Patient awake  General Assessment Comment:Expressive aphasia, son "[name]" at bedside for history  Reviewed: Allergy & Precautions, NPO status , Patient's Chart, lab work & pertinent test results  History of Anesthesia Complications Negative for: history of anesthetic complications  Airway Mallampati: III  TM Distance: >3 FB Neck ROM: Full  Mouth opening: Limited Mouth Opening  Dental  (+) Dental Advisory Given, Teeth Intact   Pulmonary neg shortness of breath, neg sleep apnea, neg COPD, neg recent URI, former smoker   breath sounds clear to auscultation       Cardiovascular hypertension, Pt. on medications + dysrhythmias Atrial Fibrillation  Rhythm:Irregular     Neuro/Psych neg Seizures PSYCHIATRIC DISORDERS Anxiety     CVA, Residual Symptoms    GI/Hepatic Neg liver ROS,,,Acute Cholecystitis   Endo/Other  Hypothyroidism    Renal/GU Renal diseaseLab Results      Component                Value               Date                      NA                       139                 01/29/2024                K                        3.2 (L)             01/29/2024                CO2                      26                  01/29/2024                GLUCOSE                  107 (H)             01/29/2024                BUN                      11                  01/29/2024                CREATININE               0.86                01/29/2024                CALCIUM                  8.9                 01/29/2024                EGFR  55 (L)              07/27/2022                GFRNONAA                 >60                 01/29/2024                Musculoskeletal  (+) Arthritis ,    Abdominal   Peds  Hematology Lab Results      Component                Value               Date                      WBC                      18.2 (H)             01/29/2024                HGB                      15.1 (H)            01/29/2024                HCT                      45.0                01/29/2024                MCV                      92.0                01/29/2024                PLT                      255                 01/29/2024              Anesthesia Other Findings   Reproductive/Obstetrics                             Anesthesia Physical Anesthesia Plan  ASA: 3  Anesthesia Plan: General   Post-op Pain Management:    Induction: Intravenous  PONV Risk Score and Plan: 4 or greater and Ondansetron  and Dexamethasone  Airway Management Planned: Oral ETT  Additional Equipment: None  Intra-op Plan:   Post-operative Plan: Extubation in OR  Informed Consent: I have reviewed the patients History and Physical, chart, labs and discussed the procedure including the risks, benefits and alternatives for the proposed anesthesia with the patient or authorized representative who has indicated his/her understanding and acceptance.   Patient has DNR.  Discussed DNR with power of attorney and Continue DNR.   Dental advisory given and Consent reviewed with POA  Plan Discussed with: CRNA  Anesthesia Plan Comments:        Anesthesia Quick Evaluation

## 2024-01-29 NOTE — Progress Notes (Signed)
 PHARMACY - ANTICOAGULATION  Pharmacy Consult for heparin   Indication: atrial fibrillation Brief A/P: aPTT subtherapeutic Increase Heparin  rate  No Known Allergies  Patient Measurements: Height: 5\' 4"  (162.6 cm) Weight: 64.3 kg (141 lb 12.1 oz) IBW/kg (Calculated) : 54.7 HEPARIN  DW (KG): 64.3  Vital Signs: Temp: 98.8 F (37.1 C) (05/19 2346) Temp Source: Oral (05/19 2346) BP: 109/64 (05/19 2346) Pulse Rate: 96 (05/19 2346)  Labs: Recent Labs    01/27/24 1648 01/27/24 1651 01/28/24 0805 01/28/24 2346  HGB 16.7* 16.7* 15.8*  --   HCT 48.5* 49.0* 45.9  --   PLT 283  --  233  --   APTT  --   --   --  44*  HEPARINUNFRC  --   --   --  >1.10*  CREATININE 1.24* 1.30* 0.82  --     Estimated Creatinine Clearance: 37.8 mL/min (by C-G formula based on SCr of 0.82 mg/dL).  Assessment: 89 y.o. female with h/o AFib, Eliquis  on hold for heparin   Goal of Therapy:  Heparin  level 0.3-0.7 units/ml aPTT 66-102 seconds Monitor platelets by anticoagulation protocol: Yes   Plan:  Increase Heparin   1000 units/hr F/U after surgery today  Claudine Cullens, PharmD, BCPS

## 2024-01-29 NOTE — Progress Notes (Signed)
 Spoke with Dr. Davonna Estes and plan is to do surgery today. Stated he talked to family yesterday. Requested he place orders for consent so that could be obtained at this time. Ordered for Heparin  gtt to be stopped at this time. Order placed in [facility] and staff message sent to [place], [place] RN. Patient to go to OR at 12pm today.

## 2024-01-29 NOTE — Progress Notes (Signed)
 * Day of Surgery *  Subjective: CC: Endorsing ongoing RUQ pain. Denies other complaints.   Objective: Vital signs in last 24 hours: Temp:  [97.2 F (36.2 C)-98.8 F (37.1 C)] 97.2 F (36.2 C) (05/20 0945) Pulse Rate:  [72-99] 85 (05/20 0945) Resp:  [17-18] 18 (05/20 0945) BP: (88-138)/(54-103) 90/54 (05/20 0945) SpO2:  [85 %-94 %] 90 % (05/20 0945) Weight:  [64 kg] 64 kg (05/20 0945) Last BM Date :  (pta)  Intake/Output from previous day: 05/19 0701 - 05/20 0700 In: 386 [I.V.:256.9; IV Piggyback:129.1] Out: 200 [Urine:200] Intake/Output this shift: No intake/output data recorded.  PE: Gen:  Alert, NAD, pleasant Abd: Soft, ND, epigastric and ruq ttp. Small umbilical hernia.    Lab Results:  Recent Labs    01/28/24 0805 01/29/24 0118  WBC 20.7* 18.2*  HGB 15.8* 15.1*  HCT 45.9 45.0  PLT 233 255   BMET Recent Labs    01/28/24 0805 01/29/24 0118  NA 138 139  K 3.3* 3.2*  CL 106 103  CO2 21* 26  GLUCOSE 107* 107*  BUN 11 11  CREATININE 0.82 0.86  CALCIUM 8.8* 8.9   PT/INR No results for input(s): "LABPROT", "INR" in the last 72 hours. CMP     Component Value Date/Time   NA 139 01/29/2024 0118   NA 145 (H) 07/27/2022 1450   K 3.2 (L) 01/29/2024 0118   CL 103 01/29/2024 0118   CO2 26 01/29/2024 0118   GLUCOSE 107 (H) 01/29/2024 0118   BUN 11 01/29/2024 0118   BUN 14 07/27/2022 1450   CREATININE 0.86 01/29/2024 0118   CALCIUM 8.9 01/29/2024 0118   PROT 6.1 (L) 01/29/2024 0118   PROT 6.6 07/27/2022 1450   ALBUMIN 2.6 (L) 01/29/2024 0118   ALBUMIN 4.4 07/27/2022 1450   AST 34 01/29/2024 0118   ALT 55 (H) 01/29/2024 0118   ALKPHOS 124 01/29/2024 0118   BILITOT 2.2 (H) 01/29/2024 0118   BILITOT 0.7 07/27/2022 1450   GFRNONAA >60 01/29/2024 0118   Lipase     Component Value Date/Time   LIPASE 46 07/17/2020 0420    Studies/Results: ECHOCARDIOGRAM COMPLETE Result Date: 01/29/2024    ECHOCARDIOGRAM REPORT   Patient Name:   MARRI MCNEFF  Date of Exam: 01/29/2024 Medical Rec #:  [id number]     Height:       64.0 in Accession #:    [id number]    Weight:       141.1 lb Date of Birth:  08/15/1929     BSA:          1.687 m Patient Age:    89 years      BP:           138/103 mmHg Patient Gender: F             HR:           77 bpm. Exam Location:  Inpatient Procedure: 2D Echo, Cardiac Doppler and Color Doppler (Both Spectral and Color            Flow Doppler were utilized during procedure). Indications:    Preoperative evaluation  History:        Patient has prior history of Echocardiogram examinations, most                 recent 07/18/2020. Stroke, Arrythmias:Atrial Fibrillation and                 Atrial Flutter; Risk Factors:Hypertension.  CKD stage 3.  Sonographer:    Juanita Shaw Referring Phys: [phone] NOAH BEDFORD WOUK IMPRESSIONS  1. Left ventricular ejection fraction, by estimation, is 65 to 70%. The left ventricle has normal function. The left ventricle has no regional wall motion abnormalities. There is mild left ventricular hypertrophy. Left ventricular diastolic parameters are indeterminate.  2. Right ventricular systolic function is normal. The right ventricular size is normal.  3. The mitral valve is normal in structure. No evidence of mitral valve regurgitation. No evidence of mitral stenosis. Moderate mitral annular calcification.  4. The aortic valve is calcified. There is mild calcification of the aortic valve. Aortic valve regurgitation is trivial. Aortic valve sclerosis is present, with no evidence of aortic valve stenosis. Aortic valve mean gradient measures 2.3 mmHg. Aortic valve Vmax measures 1.03 m/s.  5. The inferior vena cava is normal in size with greater than 50% respiratory variability, suggesting right atrial pressure of 3 mmHg. FINDINGS  Left Ventricle: Left ventricular ejection fraction, by estimation, is 65 to 70%. The left ventricle has normal function. The left ventricle has no regional wall motion abnormalities. The left  ventricular internal cavity size was normal in size. There is  mild left ventricular hypertrophy. Left ventricular diastolic parameters are indeterminate. Right Ventricle: The right ventricular size is normal. No increase in right ventricular wall thickness. Right ventricular systolic function is normal. Left Atrium: Left atrial size was normal in size. Right Atrium: Right atrial size was normal in size. Pericardium: There is no evidence of pericardial effusion. Mitral Valve: The mitral valve is normal in structure. Moderate mitral annular calcification. No evidence of mitral valve regurgitation. No evidence of mitral valve stenosis. MV peak gradient, 1.9 mmHg. The mean mitral valve gradient is 1.0 mmHg. Tricuspid Valve: The tricuspid valve is normal in structure. Tricuspid valve regurgitation is not demonstrated. No evidence of tricuspid stenosis. Aortic Valve: The aortic valve is calcified. There is mild calcification of the aortic valve. Aortic valve regurgitation is trivial. Aortic valve sclerosis is present, with no evidence of aortic valve stenosis. Aortic valve mean gradient measures 2.3 mmHg. Aortic valve peak gradient measures 4.2 mmHg. Aortic valve area, by VTI measures 2.66 cm. Pulmonic Valve: The pulmonic valve was normal in structure. Pulmonic valve regurgitation is trivial. No evidence of pulmonic stenosis. Aorta: The aortic root is normal in size and structure. Venous: The inferior vena cava is normal in size with greater than 50% respiratory variability, suggesting right atrial pressure of 3 mmHg. IAS/Shunts: No atrial level shunt detected by color flow Doppler.  LEFT VENTRICLE PLAX 2D LVIDd:         3.90 cm     Diastology LVIDs:         2.40 cm     LV e' medial:    8.49 cm/s LV PW:         1.30 cm     LV E/e' medial:  8.1 LV IVS:        1.20 cm     LV e' lateral:   8.81 cm/s LVOT diam:     2.00 cm     LV E/e' lateral: 7.8 LV SV:         36 LV SV Index:   22 LVOT Area:     3.14 cm  LV Volumes (MOD)  LV vol d, MOD A2C: 65.6 ml LV vol d, MOD A4C: 83.8 ml LV vol s, MOD A2C: 21.1 ml LV vol s, MOD A4C: 28.2 ml LV SV MOD A2C:  44.5 ml LV SV MOD A4C:     83.8 ml LV SV MOD BP:      49.7 ml RIGHT VENTRICLE RV Basal diam:  3.20 cm RV Mid diam:    1.80 cm RV S prime:     10.80 cm/s LEFT ATRIUM             Index        RIGHT ATRIUM          Index LA diam:        5.00 cm 2.96 cm/m   RA Area:     9.16 cm LA Vol (A2C):   44.7 ml 26.50 ml/m  RA Volume:   14.60 ml 8.66 ml/m LA Vol (A4C):   44.8 ml 26.56 ml/m LA Biplane Vol: 47.2 ml 27.98 ml/m  AORTIC VALVE                    PULMONIC VALVE AV Area (Vmax):    2.43 cm     PV Vmax:       0.73 m/s AV Area (Vmean):   2.34 cm     PV Peak grad:  2.1 mmHg AV Area (VTI):     2.66 cm AV Vmax:           102.60 cm/s AV Vmean:          70.000 cm/s AV VTI:            0.136 m AV Peak Grad:      4.2 mmHg AV Mean Grad:      2.3 mmHg LVOT Vmax:         79.45 cm/s LVOT Vmean:        52.050 cm/s LVOT VTI:          0.116 m LVOT/AV VTI ratio: 0.85  AORTA Ao Root diam: 3.20 cm Ao Asc diam:  3.50 cm MITRAL VALVE MV Area (PHT): 4.17 cm    SHUNTS MV Area VTI:   3.06 cm    Systemic VTI:  0.12 m MV Peak grad:  1.9 mmHg    Systemic Diam: 2.00 cm MV Mean grad:  1.0 mmHg MV Vmax:       0.68 m/s MV Vmean:      43.5 cm/s MV Decel Time: 182 msec MV E velocity: 68.70 cm/s Dorothye Gathers MD Electronically signed by Dorothye Gathers MD Signature Date/Time: 01/29/2024/9:26:53 AM    Final    CT CHEST ABDOMEN PELVIS W CONTRAST Result Date: 01/27/2024 CLINICAL DATA:  Blunt trauma.  Elevated bilirubin.  Leukocytosis. EXAM: CT CHEST, ABDOMEN, AND PELVIS WITH CONTRAST TECHNIQUE: Multidetector CT imaging of the chest, abdomen and pelvis was performed following the standard protocol during bolus administration of intravenous contrast. RADIATION DOSE REDUCTION: This exam was performed according to the departmental dose-optimization program which includes automated exposure control, adjustment of the mA and/or kV  according to patient size and/or use of iterative reconstruction technique. CONTRAST:  75mL OMNIPAQUE  IOHEXOL  350 MG/ML SOLN COMPARISON:  Lumbar and thoracic spine CT performed earlier today. Chest CTA 07/17/2020. Noncontrast abdominal CT 12/13/2020 FINDINGS: CT CHEST FINDINGS Cardiovascular: No evidence of acute aortic or vascular injury. Diffuse calcified and noncalcified atheromatous plaque in the thoracic aorta the heart is mildly enlarged. There are coronary artery calcifications. No pericardial effusion. Mediastinum/Nodes: No mediastinal hemorrhage or hematoma. No mediastinal adenopathy. Patulous esophagus. No pneumomediastinum. Lungs/Pleura: No pneumothorax or pulmonary contusion. Sequela of chronic lung disease. Bandlike opacity in the right lower lobe which has a rounded  component inferiorly, series 4, image 98. Representative measurement 19 x 18 mm, series 4, image 98. Overall this has diminished from 2021 chest CT and favor scarring. Additional areas of subpleural nodularity within the lingula and lateral left lower lobe series 4, image 89, 93, and 97 appear tubular and may represent chronic mucoid impaction. Additional areas of mucoid impaction are seen in the right middle lobe, series 4, image 102 this was present on prior exam with overall improved nodular opacities. No significant pleural effusion. The central airways are clear Musculoskeletal: Thoracic spine assessed on thoracic spine CT earlier today. Remote right anterior rib fractures. No acute rib fracture. No acute sternal fracture. CT ABDOMEN PELVIS FINDINGS Hepatobiliary: Motion artifact in the upper abdomen. There is no evidence of hepatic injury. Mild diffuse hepatic steatosis. The gallbladder is moderately distended. There is marked gallbladder wall thickening, for example series 3, image 67. Multiple punctate radiopaque densities in the gallbladder likely gallstones. Moderate pericholecystic fat stranding. No biliary dilatation. Pancreas:  No evidence of injury. No ductal dilatation or inflammation. Spleen: No evidence of injury allowing for motion. No splenomegaly or focal splenic abnormality. Adrenals/Urinary Tract: Adrenal thickening without dominant nodule or evidence of injury. No renal injury. No hydronephrosis. No evidence of renal inflammation. Left renal cyst. No further follow-up imaging is recommended. Unremarkable urinary bladder. Stomach/Bowel: No evidence of bowel injury or bowel inflammation. Stomach is decompressed. No small bowel obstruction. Normal appendix. Left colonic diverticulosis, no diverticulitis. Vascular/Lymphatic: No evidence of aortic or vascular injury. Moderate aortic atherosclerosis. No bulky abdominopelvic adenopathy. Reproductive: Status post hysterectomy. No adnexal masses. Other: Right upper quadrant fat stranding with trace free fluid. No free air. Musculoskeletal: Lumbar spine assessed on same day lumbar spine CT. No acute pelvic fracture. IMPRESSION: 1. No evidence of acute traumatic injury in the chest, abdomen, or pelvis. 2. Findings highly suspicious for acute cholecystitis. 3. Sequela of chronic lung disease, favoring chronic indolent infection. Bandlike opacity in the right lower lobe has a rounded component inferiorly. Overall this has diminished from 2021 chest CT and favor scarring. Additional areas of subpleural nodularity within the lingula and lateral left lower lobe appear tubular and may represent chronic mucoid impaction. Consider CT follow-up in 3 months to assess for stability. Aortic Atherosclerosis (ICD10-I70.0). Electronically Signed   By: Chadwick Colonel M.D.   On: 01/27/2024 19:58   CT Lumbar Spine Wo Contrast Result Date: 01/27/2024 CLINICAL DATA:  Back trauma, no prior imaging (Age >= 16y).  Fall. EXAM: CT LUMBAR SPINE WITHOUT CONTRAST TECHNIQUE: Multidetector CT imaging of the lumbar spine was performed without intravenous contrast administration. Multiplanar CT image reconstructions  were also generated. RADIATION DOSE REDUCTION: This exam was performed according to the departmental dose-optimization program which includes automated exposure control, adjustment of the mA and/or kV according to patient size and/or use of iterative reconstruction technique. COMPARISON:  None Available. FINDINGS: Segmentation: 5 lumbar type vertebrae. Alignment: Convex rightward scoliosis. No subluxation. 4 mm degenerative anterolisthesis of L4 on L5. 2 mm degenerative anterolisthesis of L3 on L4. Slight retrolisthesis in the upper lumbar spine. Vertebrae: No fracture or focal bone lesion. Paraspinal and other soft tissues: Negative Disc levels: Advanced diffuse degenerative disc disease and facet disease. IMPRESSION: No acute bony abnormality. Scoliosis and degenerative changes in the lumbar spine. Electronically Signed   By: Janeece Mechanic M.D.   On: 01/27/2024 17:47   CT Thoracic Spine Wo Contrast Result Date: 01/27/2024 CLINICAL DATA:  Fall, back pain EXAM: CT THORACIC SPINE WITHOUT CONTRAST TECHNIQUE: Multidetector CT  images of the thoracic were obtained using the standard protocol without intravenous contrast. RADIATION DOSE REDUCTION: This exam was performed according to the departmental dose-optimization program which includes automated exposure control, adjustment of the mA and/or kV according to patient size and/or use of iterative reconstruction technique. COMPARISON:  None Available. FINDINGS: Alignment: Slight retrolisthesis of T12 on L1 related to facet disease. Vertebrae: No acute fracture or focal pathologic process. Paraspinal and other soft tissues: Negative paraspinal soft tissues. Rounded airspace opacity in the right lower lobe along a area of platelike scarring. This could reflect nodular scarring. No effusions. Disc levels: Diffuse degenerative disc disease. No visible disc herniation. IMPRESSION: No acute bony abnormality.  Diffuse degenerative changes. Rounded opacity in the right lower  lobe along an area of platelike scarring. The rounded opacity measures 2.1 x 1.7 cm. This could reflect a component of scarring, but recommend follow-up to exclude mass. Follow-up chest CT in 3-6 months recommended. Electronically Signed   By: Janeece Mechanic M.D.   On: 01/27/2024 17:44   CT CERVICAL SPINE WO CONTRAST Result Date: 01/27/2024 CLINICAL DATA:  Polytrauma, blunt EXAM: CT CERVICAL SPINE WITHOUT CONTRAST TECHNIQUE: Multidetector CT imaging of the cervical spine was performed without intravenous contrast. Multiplanar CT image reconstructions were also generated. RADIATION DOSE REDUCTION: This exam was performed according to the departmental dose-optimization program which includes automated exposure control, adjustment of the mA and/or kV according to patient size and/or use of iterative reconstruction technique. COMPARISON:  None Available. FINDINGS: Alignment: No subluxation Skull base and vertebrae: No acute fracture. No primary bone lesion or focal pathologic process. Soft tissues and spinal canal: No prevertebral fluid or swelling. No visible canal hematoma. Disc levels: Advanced degenerative disc disease most pronounced at C5-6 and C6-7. Advanced bilateral degenerative facet disease. Multilevel bilateral neural foraminal narrowing. Upper chest: No acute findings Other: None Electronically Signed   By: Janeece Mechanic M.D.   On: 01/27/2024 17:41   CT HEAD WO CONTRAST Result Date: 01/27/2024 CLINICAL DATA:  Head trauma, moderate-severe.  Fall. EXAM: CT HEAD WITHOUT CONTRAST TECHNIQUE: Contiguous axial images were obtained from the base of the skull through the vertex without intravenous contrast. RADIATION DOSE REDUCTION: This exam was performed according to the departmental dose-optimization program which includes automated exposure control, adjustment of the mA and/or kV according to patient size and/or use of iterative reconstruction technique. COMPARISON:  None Available. FINDINGS: Brain: Again  noted is a hyperdense mass surrounding the left aneurysm clip measuring 3.6 x 3.0 x 2.4 cm, not significantly changed since prior study. This is compatible with a meningioma. Extensive encephalomalacia throughout the left cerebral hemisphere, stable. No acute infarct or hemorrhage. There is atrophy and chronic small vessel disease changes. Vascular: No hyperdense vessel.  Left aneurysm clip noted. Skull: Prior left temporal craniotomy. No acute calvarial abnormality. Sinuses/Orbits: No acute findings Other: None IMPRESSION: No acute intracranial abnormality. Stable meningioma surrounding the left aneurysm clip measuring 3.6 x 3.0 x 2.4 cm. Stable encephalomalacia throughout much of the left cerebral hemisphere. Atrophy, chronic microvascular disease. Electronically Signed   By: Janeece Mechanic M.D.   On: 01/27/2024 17:39   DG Pelvis Portable Result Date: 01/27/2024 CLINICAL DATA:  Unwitnessed fall. EXAM: PORTABLE PELVIS 1-2 VIEWS COMPARISON:  October 13, 2023. FINDINGS: There is no evidence of pelvic fracture or diastasis. No pelvic bone lesions are seen. IMPRESSION: Negative. Electronically Signed   By: Rosalene Colon M.D.   On: 01/27/2024 16:59    Anti-infectives: Anti-infectives (From admission, onward)  Start     Dose/Rate Route Frequency Ordered Stop   01/28/24 0345  [MAR Hold]  piperacillin -tazobactam (ZOSYN ) IVPB 3.375 g        (MAR Hold since Tue 01/29/2024 at 0936.Hold Reason: Transfer to a Procedural area)   3.375 g 12.5 mL/hr over 240 Minutes Intravenous Every 8 hours 01/28/24 0009     01/27/24 1945  piperacillin -tazobactam (ZOSYN ) IVPB 3.375 g  Status:  Discontinued        3.375 g 150 mL/hr over 30 Minutes Intravenous  Once 01/27/24 1931 01/27/24 1932   01/27/24 1945  piperacillin -tazobactam (ZOSYN ) IVPB 3.375 g        3.375 g 100 mL/hr over 30 Minutes Intravenous  Once 01/27/24 1932 01/27/24 2217        Assessment/Plan Acute Cholecystitis - Will proceed to the OR. The risks and  potential benefits of surgery were discussed with both the patient and her family, including son [name] (POA). We discussed the alternatives and potential risks of surgery, including but not limited to: bleeding, infection, damage to bowel or surrounding structures, bile leak, damage to the biliary system, pancreatitis, retained stone, need for subtotal cholecystectomy, and need for additional procedures.. All questions were addressed and consent was obtained.      FEN - NPO currently. IVF per primary  VTE - SCDs, holding Eliquis  ID - [email or web address]   I reviewed nursing notes, hospitalist notes, last 24 h vitals and pain scores, last 48 h intake and output, last 24 h labs and trends, and last 24 h imaging results.    LOS: 2 days    Cannon Champion, MD [facility] Surgery 01/29/2024, 1:00 PM Please see Amion for pager number during day hours 7:00am-4:30pm

## 2024-01-29 NOTE — Care Management Important Message (Signed)
 Important Message  Patient Details  Name: Peggy Williams MRN: [id number] Date of Birth: 1929/01/01   Important Message Given:  Yes - Medicare IM     Felix Host 01/29/2024, 5:01 PM

## 2024-01-30 ENCOUNTER — Encounter (HOSPITAL_COMMUNITY): Payer: Self-pay | Admitting: General Surgery

## 2024-01-30 DIAGNOSIS — Y92009 Unspecified place in unspecified non-institutional (private) residence as the place of occurrence of the external cause: Secondary | ICD-10-CM | POA: Diagnosis not present

## 2024-01-30 DIAGNOSIS — E039 Hypothyroidism, unspecified: Secondary | ICD-10-CM | POA: Diagnosis not present

## 2024-01-30 DIAGNOSIS — W19XXXA Unspecified fall, initial encounter: Secondary | ICD-10-CM | POA: Diagnosis not present

## 2024-01-30 LAB — CBC
HCT: 46 % (ref 36.0–46.0)
Hemoglobin: 15.2 g/dL — ABNORMAL HIGH (ref 12.0–15.0)
MCH: 30.6 pg (ref 26.0–34.0)
MCHC: 33 g/dL (ref 30.0–36.0)
MCV: 92.7 fL (ref 80.0–100.0)
Platelets: 327 10*3/uL (ref 150–400)
RBC: 4.96 MIL/uL (ref 3.87–5.11)
RDW: 13.6 % (ref 11.5–15.5)
WBC: 11.8 10*3/uL — ABNORMAL HIGH (ref 4.0–10.5)
nRBC: 0 % (ref 0.0–0.2)

## 2024-01-30 LAB — COMPREHENSIVE METABOLIC PANEL WITH GFR
ALT: 49 U/L — ABNORMAL HIGH (ref 0–44)
AST: 35 U/L (ref 15–41)
Albumin: 2.4 g/dL — ABNORMAL LOW (ref 3.5–5.0)
Alkaline Phosphatase: 102 U/L (ref 38–126)
Anion gap: 10 (ref 5–15)
BUN: 15 mg/dL (ref 8–23)
CO2: 26 mmol/L (ref 22–32)
Calcium: 9.2 mg/dL (ref 8.9–10.3)
Chloride: 103 mmol/L (ref 98–111)
Creatinine, Ser: 0.88 mg/dL (ref 0.44–1.00)
GFR, Estimated: >60 mL/min (ref >60)
Glucose, Bld: 137 mg/dL — ABNORMAL HIGH (ref 70–99)
Potassium: 4.9 mmol/L (ref 3.5–5.1)
Sodium: 139 mmol/L (ref 135–145)
Total Bilirubin: 1.1 mg/dL (ref 0.0–1.2)
Total Protein: 5.9 g/dL — ABNORMAL LOW (ref 6.5–8.1)

## 2024-01-30 LAB — HEPARIN LEVEL (UNFRACTIONATED): Heparin Unfractionated: >1.1 [IU]/mL — ABNORMAL HIGH (ref 0.30–0.70)

## 2024-01-30 LAB — APTT: aPTT: 31 s (ref 24–36)

## 2024-01-30 MED ORDER — ACETAMINOPHEN 500 MG PO TABS
1000.0000 mg | ORAL_TABLET | Freq: Three times a day (TID) | ORAL | Status: DC
Start: 2024-01-30 — End: 2024-02-01
  Administered 2024-01-30 – 2024-02-01 (×6): 1000 mg via ORAL
  Filled 2024-01-30 (×6): qty 2

## 2024-01-30 MED ORDER — BOOST / RESOURCE BREEZE PO LIQD CUSTOM
1.0000 | Freq: Three times a day (TID) | ORAL | Status: DC
  Administered 2024-01-30 – 2024-02-01 (×6): 1 via ORAL

## 2024-01-30 MED ORDER — PIPERACILLIN-TAZOBACTAM 3.375 G IVPB
3.3750 g | Freq: Three times a day (TID) | INTRAVENOUS | Status: DC
  Administered 2024-01-30 – 2024-02-01 (×6): 3.375 g via INTRAVENOUS
  Filled 2024-01-30 (×6): qty 50

## 2024-01-30 MED ORDER — PANTOPRAZOLE SODIUM 40 MG PO TBEC
40.0000 mg | DELAYED_RELEASE_TABLET | Freq: Every day | ORAL | Status: DC
  Administered 2024-01-30 – 2024-01-31 (×2): 40 mg via ORAL
  Filled 2024-01-30 (×2): qty 1

## 2024-01-30 MED ORDER — OXYCODONE HCL 5 MG PO TABS
2.5000 mg | ORAL_TABLET | Freq: Four times a day (QID) | ORAL | Status: DC | PRN
  Administered 2024-01-30: 2.5 mg via ORAL
  Filled 2024-01-30: qty 1

## 2024-01-30 MED ORDER — APIXABAN 5 MG PO TABS
5.0000 mg | ORAL_TABLET | Freq: Two times a day (BID) | ORAL | Status: DC
  Administered 2024-01-30 – 2024-02-01 (×5): 5 mg via ORAL
  Filled 2024-01-30 (×4): qty 1
  Filled 2024-01-30: qty 2

## 2024-01-30 NOTE — Progress Notes (Signed)
 PROGRESS NOTE    Peggy Williams  GUY:[id number] DOB: April 14, 1929 DOA: 01/27/2024 PCP: Tita Form, MD    Brief Narrative:   Peggy Williams is a 89 y.o. female with past medical history significant for hypothyroidism, remote aneurysm with post procedure CVA with chronic word-finding difficulties who presented to [facility] ED on 01/27/2024 via EMS from [facility]F after being found lying on the ground from a unwitnessed fall.  At baseline per family, patient is ambulatory with a walker at baseline and fairly independence regarding her ADLs.  In the ED, temperature 97.6 F, HR 77, RR 20, BP 118/62, SpO2 90% on room air.  WBC 24.9, hemoglobin 16.7, platelet count 283.  Sodium 139, potassium 3.7, chloride 102, CO2 26, glucose 120, BUN 15, Cram 1.24.  AST 55, ALT 101, total bilirubin 2.4.  Procalcitonin 0.59.  CT head without contrast with no acute intracranial malady noted stable meningioma left aneurysm clip measuring 3.6 x 3.0 x 2.4 cm, stable encephalomalacia throughout much of the left hemisphere, atrophy and chronic microvascular disease.  CT C-spine without contrast with no fracture/subluxation, noted advanced degenerative disc disease/facet disease with multilevel level bilateral neuronal foraminal narrowing.  CT T/L-spine without contrast with no acute bony abnormality.  Pelvics x-ray with no evidence of fracture.  CT chest/abdomen/pelvis with contrast with no evidence of acute traumatic injury in the C/A/P, findings highly suspicious for acute cholecystitis, bandlike opacity right lower lobe with rounded component, diminished from 2021 favors scarring, subpleural nodularity lingula and left lateral lobe appear tubular and may resent chronic mucoid impaction, consider CT follow-up 3 months.  Patient was started on IV antibiotics.  General surgery consulted.  TRH consulted for admission for further evaluation and management of acute cholecystitis.  Assessment & Plan:    Acute cholecystitis CT  chest/abdomen/pelvis on admission with findings concerning for acute cholecystitis.  Patient was afebrile but with elevated WBC count of 24.9.  General surgery was consulted and patient underwent a laparoscopic cholecystectomy by Dr. Davonna Estes on 01/29/2024 -- WBC 14.9>>11.8 -- AST 55>32>34>35 -- ALT 101>62>55>49 -- Tbili 2.4>2.1>2.2>1.1 -- Diet advanced to heart healthy today -- Tylenol  1 g p.o. every 8 hours scheduled -- Oxycodone  2.5-5 mg PO q6h PRN moderate pain -- Surgical pathology: Pending -- Continue Zosyn , transition to Augmentin on discharge for 5-day postoperative antibiotic course per CCS -- Incentive spirometry; mobilization  Mechanical fall From [facility] assisted living. -- pending PT/OT evaluation  Hypothyroidism -- Levothyroxine  50 mcg p.o. daily  Paroxysmal atrial fibrillation -- Diltiazem  CD2 140 mg p.o. daily -- Metoprolol  succinate 12.5 m p.o. daily -- Digoxin  0.125 mg p.o. daily -- Apixaban  5 mg p.o. twice daily  History of remote aneurysm with postprocedure CVA Patient with chronic word finding difficulties. -- Supportive care  DVT prophylaxis:  apixaban  (ELIQUIS ) tablet 5 mg    Code Status: Limited: Do not attempt resuscitation (DNR) -DNR-LIMITED -Do Not Intubate/DNI  Family Communication: Updated sons present at bedside this morning  Disposition Plan:  Level of care: Telemetry Medical Status is: Inpatient Remains inpatient appropriate because: IV antibiotics, PT/OT evaluation, anticipate discharge back to ALF likely tomorrow    Consultants:  General Surgery Cardiology  Procedures:  TTE Laparoscopic cholecystectomy, Dr. Davonna Estes 5/20  Antimicrobials:  Zosyn    Subjective: Patient seen examined bedside, lying in bed.  Sons present at bedside.  Sleeping but arousable.  General surgery PA present at bedside.  Advancing diet.  Recommend 5 days postoperative antibiotics.  Awaiting PT/OT evaluation.  Patient tolerating fluids, no reported flatus or  bowel movement yet.  No other specific questions or concerns at this time.  Denies chest pain, no shortness of breath, no abdominal pain, no fever.  No acute events overnight per nursing staff.  Objective: Vitals:   01/30/24 0422 01/30/24 0423 01/30/24 0832 01/30/24 1010  BP:  125/77 (!) 120/58 133/84  Pulse:  78 95 (!) 103  Resp:  17 18   Temp:  97.6 F (36.4 C) 97.8 F (36.6 C)   TempSrc:  Oral Oral   SpO2:  95% 94% 94%  Weight: 62.9 kg     Height:        Intake/Output Summary (Last 24 hours) at 01/30/2024 1253 Last data filed at 01/30/2024 0029 Gross per 24 hour  Intake 170 ml  Output 875 ml  Net -705 ml   Filed Weights   01/29/24 0458 01/29/24 0945 01/30/24 0422  Weight: 64 kg 64 kg 62.9 kg    Examination:  Physical Exam: GEN: NAD, alert and oriented x 3, elderly in appearance HEENT: NCAT, PERRL, EOMI, sclera clear, MMM PULM: CTAB w/o wheezes/crackles, normal respiratory effort, on 3 L nasal cannula CV: RRR w/o M/G/R GI: abd soft, slight TTP surrounding surgical incision site with intact glue, well-approximated without drainage or surrounding erythema/fluctuance,, + mild distention, + BS MSK: no peripheral edema, moving all remedies independently NEURO: No focal neurological deficit PSYCH: normal mood/affect Integumentary: No concerning rashes/lesions/wounds noted on exposed skin surfaces    Data Reviewed: I have personally reviewed following labs and imaging studies  CBC: Recent Labs  Lab 01/27/24 1648 01/27/24 1651 01/28/24 0805 01/29/24 0118 01/30/24 0318  WBC 24.9*  --  20.7* 18.2* 11.8*  NEUTROABS 21.7*  --   --   --   --   HGB 16.7* 16.7* 15.8* 15.1* 15.2*  HCT 48.5* 49.0* 45.9 45.0 46.0  MCV 91.0  --  91.1 92.0 92.7  PLT 283  --  233 255 327   Basic Metabolic Panel: Recent Labs  Lab 01/27/24 1648 01/27/24 1651 01/28/24 0805 01/29/24 0118 01/29/24 0930 01/30/24 0318  NA 139 139 138 139  --  139  K 3.7 3.7 3.3* 3.2*  --  4.9  CL 102 102  106 103  --  103  CO2 26  --  21* 26  --  26  GLUCOSE 120* 118* 107* 107*  --  137*  BUN 15 19 11 11   --  15  CREATININE 1.24* 1.30* 0.82 0.86  --  0.88  CALCIUM 9.8  --  8.8* 8.9  --  9.2  MG  --   --   --   --  1.9  --    GFR: Estimated Creatinine Clearance: 35.2 mL/min (by C-G formula based on SCr of 0.88 mg/dL). Liver Function Tests: Recent Labs  Lab 01/27/24 1648 01/28/24 0805 01/29/24 0118 01/30/24 0318  AST 55* 32 34 35  ALT 101* 62* 55* 49*  ALKPHOS 114 90 124 102  BILITOT 2.4* 2.1* 2.2* 1.1  PROT 6.9 5.8* 6.1* 5.9*  ALBUMIN 3.7 2.7* 2.6* 2.4*   No results for input(s): "LIPASE", "AMYLASE" in the last 168 hours. No results for input(s): "AMMONIA" in the last 168 hours. Coagulation Profile: No results for input(s): "INR", "PROTIME" in the last 168 hours. Cardiac Enzymes: No results for input(s): "CKTOTAL", "CKMB", "CKMBINDEX", "TROPONINI" in the last 168 hours. BNP (last 3 results) No results for input(s): "PROBNP" in the last 8760 hours. HbA1C: No results for input(s): "HGBA1C" in the last 72 hours. CBG:  No results for input(s): "GLUCAP" in the last 168 hours. Lipid Profile: No results for input(s): "CHOL", "HDL", "LDLCALC", "TRIG", "CHOLHDL", "LDLDIRECT" in the last 72 hours. Thyroid  Function Tests: No results for input(s): "TSH", "T4TOTAL", "FREET4", "T3FREE", "THYROIDAB" in the last 72 hours. Anemia Panel: No results for input(s): "VITAMINB12", "FOLATE", "FERRITIN", "TIBC", "IRON", "RETICCTPCT" in the last 72 hours. Sepsis Labs: Recent Labs  Lab 01/27/24 1648  PROCALCITON 0.59    Recent Results (from the past 240 hours)  Blood culture (routine x 2)     Status: None (Preliminary result)   Collection Time: 01/28/24  8:05 AM   Specimen: BLOOD LEFT HAND  Result Value Ref Range Status   Specimen Description BLOOD LEFT HAND  Final   Special Requests   Final    BOTTLES DRAWN AEROBIC AND ANAEROBIC Blood Culture results may not be optimal due to an inadequate  volume of blood received in culture bottles   Culture   Final    NO GROWTH 2 DAYS Performed at [facility], [address], [place], [place] [address]    Report Status PENDING  Incomplete  Blood culture (routine x 2)     Status: None (Preliminary result)   Collection Time: 01/28/24  8:07 AM   Specimen: BLOOD RIGHT HAND  Result Value Ref Range Status   Specimen Description BLOOD RIGHT HAND  Final   Special Requests   Final    BOTTLES DRAWN AEROBIC AND ANAEROBIC Blood Culture adequate volume   Culture   Final    NO GROWTH 2 DAYS Performed at [facility], [address], [place], [place] [address]    Report Status PENDING  Incomplete         Radiology Studies: ECHOCARDIOGRAM COMPLETE Result Date: 01/29/2024    ECHOCARDIOGRAM REPORT   Patient Name:   Peggy Williams Date of Exam: 01/29/2024 Medical Rec #:  [id number]     Height:       64.0 in Accession #:    [id number]    Weight:       141.1 lb Date of Birth:  [birth year]     BSA:          1.687 m Patient Age:    89 years      BP:           138/103 mmHg Patient Gender: F             HR:           77 bpm. Exam Location:  Inpatient Procedure: 2D Echo, Cardiac Doppler and Color Doppler (Both Spectral and Color            Flow Doppler were utilized during procedure). Indications:    Preoperative evaluation  History:        Patient has prior history of Echocardiogram examinations, most                 recent 07/18/2020. Stroke, Arrythmias:Atrial Fibrillation and                 Atrial Flutter; Risk Factors:Hypertension. CKD stage 3.  Sonographer:    Juanita Shaw Referring Phys: [phone] NOAH BEDFORD WOUK IMPRESSIONS  1. Left ventricular ejection fraction, by estimation, is 65 to 70%. The left ventricle has normal function. The left ventricle has no regional wall motion abnormalities. There is mild left ventricular hypertrophy. Left ventricular diastolic parameters are indeterminate.  2. Right ventricular systolic function is normal. The right  ventricular size is  normal.  3. The mitral valve is normal in structure. No evidence of mitral valve regurgitation. No evidence of mitral stenosis. Moderate mitral annular calcification.  4. The aortic valve is calcified. There is mild calcification of the aortic valve. Aortic valve regurgitation is trivial. Aortic valve sclerosis is present, with no evidence of aortic valve stenosis. Aortic valve mean gradient measures 2.3 mmHg. Aortic valve Vmax measures 1.03 m/s.  5. The inferior vena cava is normal in size with greater than 50% respiratory variability, suggesting right atrial pressure of 3 mmHg. FINDINGS  Left Ventricle: Left ventricular ejection fraction, by estimation, is 65 to 70%. The left ventricle has normal function. The left ventricle has no regional wall motion abnormalities. The left ventricular internal cavity size was normal in size. There is  mild left ventricular hypertrophy. Left ventricular diastolic parameters are indeterminate. Right Ventricle: The right ventricular size is normal. No increase in right ventricular wall thickness. Right ventricular systolic function is normal. Left Atrium: Left atrial size was normal in size. Right Atrium: Right atrial size was normal in size. Pericardium: There is no evidence of pericardial effusion. Mitral Valve: The mitral valve is normal in structure. Moderate mitral annular calcification. No evidence of mitral valve regurgitation. No evidence of mitral valve stenosis. MV peak gradient, 1.9 mmHg. The mean mitral valve gradient is 1.0 mmHg. Tricuspid Valve: The tricuspid valve is normal in structure. Tricuspid valve regurgitation is not demonstrated. No evidence of tricuspid stenosis. Aortic Valve: The aortic valve is calcified. There is mild calcification of the aortic valve. Aortic valve regurgitation is trivial. Aortic valve sclerosis is present, with no evidence of aortic valve stenosis. Aortic valve mean gradient measures 2.3 mmHg. Aortic valve peak  gradient measures 4.2 mmHg. Aortic valve area, by VTI measures 2.66 cm. Pulmonic Valve: The pulmonic valve was normal in structure. Pulmonic valve regurgitation is trivial. No evidence of pulmonic stenosis. Aorta: The aortic root is normal in size and structure. Venous: The inferior vena cava is normal in size with greater than 50% respiratory variability, suggesting right atrial pressure of 3 mmHg. IAS/Shunts: No atrial level shunt detected by color flow Doppler.  LEFT VENTRICLE PLAX 2D LVIDd:         3.90 cm     Diastology LVIDs:         2.40 cm     LV e' medial:    8.49 cm/s LV PW:         1.30 cm     LV E/e' medial:  8.1 LV IVS:        1.20 cm     LV e' lateral:   8.81 cm/s LVOT diam:     2.00 cm     LV E/e' lateral: 7.8 LV SV:         36 LV SV Index:   22 LVOT Area:     3.14 cm  LV Volumes (MOD) LV vol d, MOD A2C: 65.6 ml LV vol d, MOD A4C: 83.8 ml LV vol s, MOD A2C: 21.1 ml LV vol s, MOD A4C: 28.2 ml LV SV MOD A2C:     44.5 ml LV SV MOD A4C:     83.8 ml LV SV MOD BP:      49.7 ml RIGHT VENTRICLE RV Basal diam:  3.20 cm RV Mid diam:    1.80 cm RV S prime:     10.80 cm/s LEFT ATRIUM             Index  RIGHT ATRIUM          Index LA diam:        5.00 cm 2.96 cm/m   RA Area:     9.16 cm LA Vol (A2C):   44.7 ml 26.50 ml/m  RA Volume:   14.60 ml 8.66 ml/m LA Vol (A4C):   44.8 ml 26.56 ml/m LA Biplane Vol: 47.2 ml 27.98 ml/m  AORTIC VALVE                    PULMONIC VALVE AV Area (Vmax):    2.43 cm     PV Vmax:       0.73 m/s AV Area (Vmean):   2.34 cm     PV Peak grad:  2.1 mmHg AV Area (VTI):     2.66 cm AV Vmax:           102.60 cm/s AV Vmean:          70.000 cm/s AV VTI:            0.136 m AV Peak Grad:      4.2 mmHg AV Mean Grad:      2.3 mmHg LVOT Vmax:         79.45 cm/s LVOT Vmean:        52.050 cm/s LVOT VTI:          0.116 m LVOT/AV VTI ratio: 0.85  AORTA Ao Root diam: 3.20 cm Ao Asc diam:  3.50 cm MITRAL VALVE MV Area (PHT): 4.17 cm    SHUNTS MV Area VTI:   3.06 cm    Systemic VTI:  0.12  m MV Peak grad:  1.9 mmHg    Systemic Diam: 2.00 cm MV Mean grad:  1.0 mmHg MV Vmax:       0.68 m/s MV Vmean:      43.5 cm/s MV Decel Time: 182 msec MV E velocity: 68.70 cm/s Dorothye Gathers MD Electronically signed by Dorothye Gathers MD Signature Date/Time: 01/29/2024/9:26:53 AM    Final         Scheduled Meds:  acetaminophen   1,000 mg Oral Q8H   apixaban   5 mg Oral BID   digoxin   0.125 mg Oral Daily   diltiazem   240 mg Oral Daily   feeding supplement  1 Container Oral TID BM   levothyroxine   50 mcg Oral Q0600   metoprolol  succinate  12.5 mg Oral Daily   pantoprazole   40 mg Oral Q supper   sodium chloride  flush  10-40 mL Intracatheter Q12H   sodium chloride  flush  3 mL Intravenous Q12H   sodium chloride  flush  3-10 mL Intravenous Q12H   Continuous Infusions:  piperacillin -tazobactam (ZOSYN )  IV       LOS: 3 days    Time spent: 52 minutes spent on 01/30/2024 caring for this patient face-to-face including chart review, ordering labs/tests, documenting, discussion with nursing staff, consultants, updating family and interview/physical exam    Rema Care [place], DO [facility] Available via Epic secure chat 7am-7pm After these hours, please refer to coverage provider listed on [email or web address] 01/30/2024, 12:53 PM

## 2024-01-30 NOTE — Progress Notes (Addendum)
 1 Day Post-Op  Subjective: CC: Her sons are at bedside. Seen with TRH.  Family reports she has seemed comfortable post op. No obvious reports of pain. Tolerating cld. No vomiting. Voiding. Has not been oob.   Afebrile. Tachycardia resolved. Soft BP resolved with last SBP 120. WBC downtrending. LFT's downtrending with normal alk phos and T. Bili. Hgb stable.   Objective: Vital signs in last 24 hours: Temp:  [97.2 F (36.2 C)-98.6 F (37 C)] 97.8 F (36.6 C) (05/21 0832) Pulse Rate:  [68-124] 95 (05/21 0832) Resp:  [17-22] 18 (05/21 0832) BP: (90-171)/(54-86) 120/58 (05/21 0832) SpO2:  [90 %-100 %] 94 % (05/21 0832) Weight:  [62.9 kg-64 kg] 62.9 kg (05/21 0422) Last BM Date :  (pta)  Intake/Output from previous day: 05/20 0701 - 05/21 0700 In: 170 [P.O.:120; IV Piggyback:50] Out: 875 [Urine:850; Blood:25] Intake/Output this shift: No intake/output data recorded.  PE: Gen:  NAD, pleasant Abd: Soft, no distension, appropriately tender around laparoscopic incisions, no rigidity or guarding and otherwise NT, +BS. Incisions with glue intact appears well and are without drainage, bleeding, or signs of infection. Lab Results:  Recent Labs    01/29/24 0118 01/30/24 0318  WBC 18.2* 11.8*  HGB 15.1* 15.2*  HCT 45.0 46.0  PLT 255 327   BMET Recent Labs    01/29/24 0118 01/30/24 0318  NA 139 139  K 3.2* 4.9  CL 103 103  CO2 26 26  GLUCOSE 107* 137*  BUN 11 15  CREATININE 0.86 0.88  CALCIUM 8.9 9.2   PT/INR No results for input(s): "LABPROT", "INR" in the last 72 hours. CMP     Component Value Date/Time   NA 139 01/30/2024 0318   NA 145 (H) 07/27/2022 1450   K 4.9 01/30/2024 0318   CL 103 01/30/2024 0318   CO2 26 01/30/2024 0318   GLUCOSE 137 (H) 01/30/2024 0318   BUN 15 01/30/2024 0318   BUN 14 07/27/2022 1450   CREATININE 0.88 01/30/2024 0318   CALCIUM 9.2 01/30/2024 0318   PROT 5.9 (L) 01/30/2024 0318   PROT 6.6 07/27/2022 1450   ALBUMIN 2.4 (L)  01/30/2024 0318   ALBUMIN 4.4 07/27/2022 1450   AST 35 01/30/2024 0318   ALT 49 (H) 01/30/2024 0318   ALKPHOS 102 01/30/2024 0318   BILITOT 1.1 01/30/2024 0318   BILITOT 0.7 07/27/2022 1450   GFRNONAA >60 01/30/2024 0318   Lipase     Component Value Date/Time   LIPASE 46 07/17/2020 0420    Studies/Results: ECHOCARDIOGRAM COMPLETE Result Date: 01/29/2024    ECHOCARDIOGRAM REPORT   Patient Name:   LAURABELLE GORCZYCA Date of Exam: 01/29/2024 Medical Rec #:  [id number]     Height:       64.0 in Accession #:    [id number]    Weight:       141.1 lb Date of Birth:  06-Sep-1929     BSA:          1.687 m Patient Age:    89 years      BP:           138/103 mmHg Patient Gender: F             HR:           77 bpm. Exam Location:  Inpatient Procedure: 2D Echo, Cardiac Doppler and Color Doppler (Both Spectral and Color            Flow Doppler were utilized during procedure).  Indications:    Preoperative evaluation  History:        Patient has prior history of Echocardiogram examinations, most                 recent 07/18/2020. Stroke, Arrythmias:Atrial Fibrillation and                 Atrial Flutter; Risk Factors:Hypertension. CKD stage 3.  Sonographer:    Juanita Shaw Referring Phys: [phone] NOAH BEDFORD WOUK IMPRESSIONS  1. Left ventricular ejection fraction, by estimation, is 65 to 70%. The left ventricle has normal function. The left ventricle has no regional wall motion abnormalities. There is mild left ventricular hypertrophy. Left ventricular diastolic parameters are indeterminate.  2. Right ventricular systolic function is normal. The right ventricular size is normal.  3. The mitral valve is normal in structure. No evidence of mitral valve regurgitation. No evidence of mitral stenosis. Moderate mitral annular calcification.  4. The aortic valve is calcified. There is mild calcification of the aortic valve. Aortic valve regurgitation is trivial. Aortic valve sclerosis is present, with no evidence of aortic valve  stenosis. Aortic valve mean gradient measures 2.3 mmHg. Aortic valve Vmax measures 1.03 m/s.  5. The inferior vena cava is normal in size with greater than 50% respiratory variability, suggesting right atrial pressure of 3 mmHg. FINDINGS  Left Ventricle: Left ventricular ejection fraction, by estimation, is 65 to 70%. The left ventricle has normal function. The left ventricle has no regional wall motion abnormalities. The left ventricular internal cavity size was normal in size. There is  mild left ventricular hypertrophy. Left ventricular diastolic parameters are indeterminate. Right Ventricle: The right ventricular size is normal. No increase in right ventricular wall thickness. Right ventricular systolic function is normal. Left Atrium: Left atrial size was normal in size. Right Atrium: Right atrial size was normal in size. Pericardium: There is no evidence of pericardial effusion. Mitral Valve: The mitral valve is normal in structure. Moderate mitral annular calcification. No evidence of mitral valve regurgitation. No evidence of mitral valve stenosis. MV peak gradient, 1.9 mmHg. The mean mitral valve gradient is 1.0 mmHg. Tricuspid Valve: The tricuspid valve is normal in structure. Tricuspid valve regurgitation is not demonstrated. No evidence of tricuspid stenosis. Aortic Valve: The aortic valve is calcified. There is mild calcification of the aortic valve. Aortic valve regurgitation is trivial. Aortic valve sclerosis is present, with no evidence of aortic valve stenosis. Aortic valve mean gradient measures 2.3 mmHg. Aortic valve peak gradient measures 4.2 mmHg. Aortic valve area, by VTI measures 2.66 cm. Pulmonic Valve: The pulmonic valve was normal in structure. Pulmonic valve regurgitation is trivial. No evidence of pulmonic stenosis. Aorta: The aortic root is normal in size and structure. Venous: The inferior vena cava is normal in size with greater than 50% respiratory variability, suggesting right  atrial pressure of 3 mmHg. IAS/Shunts: No atrial level shunt detected by color flow Doppler.  LEFT VENTRICLE PLAX 2D LVIDd:         3.90 cm     Diastology LVIDs:         2.40 cm     LV e' medial:    8.49 cm/s LV PW:         1.30 cm     LV E/e' medial:  8.1 LV IVS:        1.20 cm     LV e' lateral:   8.81 cm/s LVOT diam:     2.00 cm  LV E/e' lateral: 7.8 LV SV:         36 LV SV Index:   22 LVOT Area:     3.14 cm  LV Volumes (MOD) LV vol d, MOD A2C: 65.6 ml LV vol d, MOD A4C: 83.8 ml LV vol s, MOD A2C: 21.1 ml LV vol s, MOD A4C: 28.2 ml LV SV MOD A2C:     44.5 ml LV SV MOD A4C:     83.8 ml LV SV MOD BP:      49.7 ml RIGHT VENTRICLE RV Basal diam:  3.20 cm RV Mid diam:    1.80 cm RV S prime:     10.80 cm/s LEFT ATRIUM             Index        RIGHT ATRIUM          Index LA diam:        5.00 cm 2.96 cm/m   RA Area:     9.16 cm LA Vol (A2C):   44.7 ml 26.50 ml/m  RA Volume:   14.60 ml 8.66 ml/m LA Vol (A4C):   44.8 ml 26.56 ml/m LA Biplane Vol: 47.2 ml 27.98 ml/m  AORTIC VALVE                    PULMONIC VALVE AV Area (Vmax):    2.43 cm     PV Vmax:       0.73 m/s AV Area (Vmean):   2.34 cm     PV Peak grad:  2.1 mmHg AV Area (VTI):     2.66 cm AV Vmax:           102.60 cm/s AV Vmean:          70.000 cm/s AV VTI:            0.136 m AV Peak Grad:      4.2 mmHg AV Mean Grad:      2.3 mmHg LVOT Vmax:         79.45 cm/s LVOT Vmean:        52.050 cm/s LVOT VTI:          0.116 m LVOT/AV VTI ratio: 0.85  AORTA Ao Root diam: 3.20 cm Ao Asc diam:  3.50 cm MITRAL VALVE MV Area (PHT): 4.17 cm    SHUNTS MV Area VTI:   3.06 cm    Systemic VTI:  0.12 m MV Peak grad:  1.9 mmHg    Systemic Diam: 2.00 cm MV Mean grad:  1.0 mmHg MV Vmax:       0.68 m/s MV Vmean:      43.5 cm/s MV Decel Time: 182 msec MV E velocity: 68.70 cm/s Dorothye Gathers MD Electronically signed by Dorothye Gathers MD Signature Date/Time: 01/29/2024/9:26:53 AM    Final     Anti-infectives: Anti-infectives (From admission, onward)    Start     Dose/Rate  Route Frequency Ordered Stop   01/28/24 0345  piperacillin -tazobactam (ZOSYN ) IVPB 3.375 g        3.375 g 12.5 mL/hr over 240 Minutes Intravenous Every 8 hours 01/28/24 0009     01/27/24 1945  piperacillin -tazobactam (ZOSYN ) IVPB 3.375 g  Status:  Discontinued        3.375 g 150 mL/hr over 30 Minutes Intravenous  Once 01/27/24 1931 01/27/24 1932   01/27/24 1945  piperacillin -tazobactam (ZOSYN ) IVPB 3.375 g        3.375 g 100 mL/hr over 30 Minutes  Intravenous  Once 01/27/24 1932 01/27/24 2217        Assessment/Plan POD 1 s/p laparoscopic cholecystectomy by Dr. Davonna Estes on 01/29/24 for Acute Cholecystitis - Adv diet - LFT's downtrending - Recommend 5d abx post op. Would switch to PO Augmentin upon discharge.  Dwayne Given, PT - Pulm toilet/IS - Reviewed with MD. We will sign off. Once patient is tolerating a diet and given final recs by PT okay for d/c from our standpoint with appropriate assistance based on PT recs. Will arrange follow up. Abx recs as above. Discussed discharge instructions, restrictions and return/call back precautions with family. Discussed w/ TRH. Please call back with any questions or concerns.   FEN - HH VTE - SCDs, okay to restart Eliquis  ID - Zosyn      LOS: 3 days    Delton Filbert, [facility] 01/30/2024, 8:55 AM Please see Amion for pager number during day hours 7:00am-4:30pm

## 2024-01-30 NOTE — Plan of Care (Signed)

## 2024-01-30 NOTE — [place] FL2 (Signed)
 [place]  MEDICAID FL2 LEVEL OF CARE FORM     IDENTIFICATION  Patient Name: Peggy Williams Birthdate: July 04, 1929 Sex: female Admission Date (Current Location): 01/27/2024  [facility] and [place] Number:  Producer, television/film/video and Address:  [facility], [address] [place], [place] [address]      Provider Number: [id number]  Attending Physician Name and Address:  [place], Eric J, DO  Relative Name and Phone Number:  Michail Africa [phone]    Current Level of Care: Hospital Recommended Level of Care: Skilled Nursing Facility Prior Approval Number:    Date Approved/Denied:   PASRR Number: [id number]  Discharge Plan: SNF    Current Diagnoses: Patient Active Problem List   Diagnosis Date Noted   Primary hypertension 01/28/2024   Fall at home, initial encounter 01/27/2024   Anxiety 08/08/2022   Hyperglycemia 07/27/2022   Acquired hypothyroidism 07/27/2022   Primary osteoarthritis of both knees 07/27/2022   BMI 24.0-24.9, adult 07/27/2022   Stage 3 chronic kidney disease (HCC) 07/27/2022   Hypercholesterolemia 07/27/2022   Cerebrovascular disease 07/27/2022   Atrial fibrillation (HCC) 07/27/2022   Dysarthria 07/27/2022   New onset atrial flutter (HCC) 07/17/2020   History of CVA (cerebrovascular accident) 07/17/2020   Shingles 07/17/2020   DNR (do not resuscitate) 07/17/2020    Orientation RESPIRATION BLADDER Height & Weight     Self  O2 Incontinent, External catheter Weight: 138 lb 10.7 oz (62.9 kg) Height:  5\' 4"  (162.6 cm)  BEHAVIORAL SYMPTOMS/MOOD NEUROLOGICAL BOWEL NUTRITION STATUS      Continent Diet (see discharge summary)  AMBULATORY STATUS COMMUNICATION OF NEEDS Skin   Limited Assist Verbally Other (Comment) (ecchymosis)                       Personal Care Assistance Level of Assistance  Bathing, Feeding, Dressing Bathing Assistance: Limited assistance Feeding assistance: Limited assistance Dressing Assistance:  Limited assistance     Functional Limitations Info  Sight, Hearing, Speech Sight Info: Adequate Hearing Info: Impaired Speech Info: Impaired    SPECIAL CARE FACTORS FREQUENCY  PT (By licensed PT), OT (By licensed OT)     PT Frequency: 5x week OT Frequency: 5x week            Contractures Contractures Info: Not present    Additional Factors Info  Code Status, Allergies Code Status Info: DNR Allergies Info: NKA           Current Medications (01/30/2024):  This is the current hospital active medication list Current Facility-Administered Medications  Medication Dose Route Frequency Provider Last Rate Last Admin   acetaminophen  (TYLENOL ) tablet 1,000 mg  1,000 mg Oral Q8H Maczis, Michael M, PA-C   1,000 mg at 01/30/24 1329   apixaban  (ELIQUIS ) tablet 5 mg  5 mg Oral BID Maczis, Michael M, PA-C   5 mg at 01/30/24 1320   digoxin  (LANOXIN ) tablet 0.125 mg  0.125 mg Oral Daily Janeane Mealy, MD   0.125 mg at 01/30/24 1319   diltiazem  (CARDIZEM  CD) 24 hr capsule 240 mg  240 mg Oral Daily Janeane Mealy, MD   240 mg at 01/30/24 1320   feeding supplement (BOOST / RESOURCE BREEZE) liquid 1 Container  1 Container Oral TID BM Gerrit Krill, RN   1 Container at 01/30/24 1012   levothyroxine  (SYNTHROID ) tablet 50 mcg  50 mcg Oral Q0600 Janeane Mealy, MD   50 mcg at 01/30/24 0528   metoprolol  succinate (TOPROL -XL)  24 hr tablet 12.5 mg  12.5 mg Oral Daily Janeane Mealy, MD   12.5 mg at 01/30/24 1320   morphine  (PF) 2 MG/ML injection 2 mg  2 mg Intravenous Q4H PRN Patel, Ekta V, MD   2 mg at 01/30/24 1610   oxyCODONE  (Oxy IR/ROXICODONE ) immediate release tablet 2.5-5 mg  2.5-5 mg Oral Q6H PRN Maczis, Michael M, PA-C       pantoprazole  (PROTONIX ) EC tablet 40 mg  40 mg Oral Q supper [place], Andrew Banister, [place]       piperacillin -tazobactam (ZOSYN ) IVPB 3.375 g  3.375 g Intravenous Q8H Maczis, Michael M, PA-C 12.5 mL/hr at 01/30/24 1340 3.375 g at 01/30/24 1340   sodium  chloride flush (NS) 0.9 % injection 10-40 mL  10-40 mL Intracatheter Q12H Wouk, Haynes Lips, MD   10 mL at 01/30/24 1012   sodium chloride  flush (NS) 0.9 % injection 10-40 mL  10-40 mL Intracatheter PRN Wouk, Haynes Lips, MD       sodium chloride  flush (NS) 0.9 % injection 3 mL  3 mL Intravenous Q12H Brunilda Capra V, MD   3 mL at 01/30/24 1012   sodium chloride  flush (NS) 0.9 % injection 3-10 mL  3-10 mL Intravenous Q12H Brunilda Capra V, MD   10 mL at 01/29/24 0005   sodium chloride  flush (NS) 0.9 % injection 3-10 mL  3-10 mL Intravenous PRN Lavanda Porter, MD         Discharge Medications: Please see discharge summary for a list of discharge medications.  Relevant Imaging Results:  Relevant Lab Results:   Additional Information SSN: [id number]  Elspeth Hals, LCSW

## 2024-01-30 NOTE — Evaluation (Signed)
 Physical Therapy Evaluation Patient Details Name: Peggy Williams MRN: [id number] DOB: Aug 17, 1929 Today's Date: 01/30/2024  History of Present Illness  Peggy Williams is a 89 y.o. female brought in for a fall that was unwitnessed; with acute cholecystitis, s/p Lap Chole on 5/20; with past medical history of hypothyroidism and very remote aneurysm with post-procedure CVA (has chronic word-finding difficulties)  Clinical Impression   Pt admitted with above diagnosis. Lives at [facility]; Prior to admission, pt was able to walk to the dining hall with her Rollator RW; Presents to PT with generalized weakness, decr activity tolerance, a functional decline compared to her baseline;  today, she needed mod assist for rolling and pushing up sidelying to sit, mod assist to stand from elevated bed, and mod assist of 2 (with chair follow) for walking with RW in the hallway; Good participation during session, quite fatigued at end of session; With expressive aphasia, and yes/no questions are helpful; Pt currently with functional limitations due to the deficits listed below (see PT Problem List). Pt will benefit from skilled PT to increase their independence and safety with mobility to allow discharge to the venue listed below.       In considering options for discharge, I value going back to familiar environment, caregivers, and routines for patients with dementia; with this in mind, I favor getting back to her ALF apartment with HHPT follow up; if she progresses slwoly, will consider dc'ing to SNF level at [facility].      If plan is discharge home, recommend the following: A little help with walking and/or transfers;A lot of help with bathing/dressing/bathroom   Can travel by private vehicle        Equipment Recommendations None recommended by PT  Recommendations for Other Services  OT consult (as ordered)    Functional Status Assessment Patient has had a recent decline in their functional status and  demonstrates the ability to make significant improvements in function in a reasonable and predictable amount of time.     Precautions / Restrictions Precautions Precautions: Fall Recall of Precautions/Restrictions: Impaired Precaution/Restrictions Comments: REc log roll for comfort Restrictions Weight Bearing Restrictions Per Provider Order: No      Mobility  Bed Mobility Overal bed mobility: Needs Assistance Bed Mobility: Rolling, Sidelying to Sit Rolling: Mod assist Sidelying to sit: Mod assist       General bed mobility comments: Tactile cues; opted to log roll to decr work on abdominals and trunk    Transfers Overall transfer level: Needs assistance Equipment used: Rolling walker (2 wheels) Transfers: Sit to/from Stand Sit to Stand: Mod assist, +2 safety/equipment           General transfer comment: Light mod assist to power up; cues for hand placement and safety    Ambulation/Gait Ambulation/Gait assistance: Mod assist, +2 physical assistance, +2 safety/equipment (chair follow) Gait Distance (Feet): 120 Feet Assistive device: Rolling walker (2 wheels) Gait Pattern/deviations: Step-through pattern, Decreased step length - right, Decreased step length - left, Trunk flexed Gait velocity: slowed     General Gait Details: noting slightly decr stabiltiy in R stance, with light mod assist to steady; one loss of balance to the R, requiring Mod assist to steady; Close monitor of facial expression/body positioning for fatigue  Stairs            Wheelchair Mobility     Tilt Bed    Modified Rankin (Stroke Patients Only)       Balance  Pertinent Vitals/Pain Pain Assessment Pain Assessment: Faces Faces Pain Scale: Hurts a little bit Pain Location: Did not specify; grimace with bed mobiltiy Pain Descriptors / Indicators: Grimacing Pain Intervention(s): Monitored during session    Home  Living Family/patient expects to be discharged to:: Assisted living                 Home Equipment: Agricultural consultant (2 wheels)      Prior Function Prior Level of Function : Needs assist             Mobility Comments: Walks to dining hall with RW ADLs Comments: Staff assist     Extremity/Trunk Assessment   Upper Extremity Assessment Upper Extremity Assessment: Defer to OT evaluation    Lower Extremity Assessment Lower Extremity Assessment: Generalized weakness       Communication   Communication Communication: Impaired Factors Affecting Communication: Difficulty expressing self;Hearing impaired    Cognition Arousal: Alert Behavior During Therapy: Flat affect   PT - Cognitive impairments: History of cognitive impairments, Difficult to assess Difficult to assess due to: Impaired communication, Hard of hearing/deaf                     PT - Cognition Comments: Pt has word-finding aphasia since stroke Following commands: Intact       Cueing Cueing Techniques: Verbal cues, Gestural cues, Tactile cues     General Comments General comments (skin integrity, edema, etc.): Walked on room air; O2 sat at end of walk 92% (pt fatigued and looking like she will nap, so re-started supplemental O2); HR 112 bpm, BP 149/82; Son, Peggy Williams, present and helpful    Exercises     Assessment/Plan    PT Assessment Patient needs continued PT services  PT Problem List Decreased strength;Decreased range of motion;Decreased activity tolerance;Decreased balance;Decreased mobility;Decreased cognition;Decreased coordination;Decreased knowledge of use of DME;Decreased safety awareness;Decreased knowledge of precautions;Pain       PT Treatment Interventions DME instruction;Gait training;Functional mobility training;Stair training;Therapeutic activities;Therapeutic exercise;Balance training;Neuromuscular re-education;Cognitive remediation;Patient/family education;Wheelchair mobility  training;Manual techniques    PT Goals (Current goals can be found in the Care Plan section)  Acute Rehab PT Goals Patient Stated Goal: She had told her son earlier that she wanted to get up OOB PT Goal Formulation: With patient/family Time For Goal Achievement: 02/13/24 Potential to Achieve Goals: Good    Frequency Min 4X/week     Co-evaluation               AM-PAC PT "6 Clicks" Mobility  Outcome Measure Help needed turning from your back to your side while in a flat bed without using bedrails?: A Little Help needed moving from lying on your back to sitting on the side of a flat bed without using bedrails?: A Lot Help needed moving to and from a bed to a chair (including a wheelchair)?: A Lot Help needed standing up from a chair using your arms (e.g., wheelchair or bedside chair)?: A Lot Help needed to walk in hospital room?: A Lot Help needed climbing 3-5 steps with a railing? : A Lot 6 Click Score: 13    End of Session   Activity Tolerance: Patient tolerated treatment well (though very fatigued) Patient left: in chair;with call bell/phone within reach;with family/visitor present Nurse Communication: Mobility status;Other (comment) (considerations for DC) PT Visit Diagnosis: Unsteadiness on feet (R26.81);Other abnormalities of gait and mobility (R26.89);Pain;Muscle weakness (generalized) (M62.81) Pain - Right/Left:  (abdominal) Pain - part of body:  (abdominal)    Time:  0981-1914 PT Time Calculation (min) (ACUTE ONLY): 40 min   Charges:   PT Evaluation $PT Eval Moderate Complexity: 1 Mod PT Treatments $Gait Training: 8-22 mins $Therapeutic Activity: 8-22 mins PT General Charges $$ ACUTE PT VISIT: 1 Visit         Darcus Eastern, PT  Acute Rehabilitation Services Office [phone] Secure Chat welcomed   Marcial Setting 01/30/2024, 2:16 PM

## 2024-01-30 NOTE — Evaluation (Signed)
 Occupational Therapy Evaluation Patient Details Name: Peggy Williams MRN: [id number] DOB: 09-04-1929 Today's Date: 01/30/2024   History of Present Illness   Peggy Williams is a 89 y.o. female brought in for a fall that was unwitnessed; with acute cholecystitis, s/p Lap Chole on 5/20; with past medical history of hypothyroidism and very remote aneurysm with post-procedure CVA (has chronic word-finding difficulties)     Clinical Impressions Pt admitted based on above, and was seen based on problem list below. PTA pt was living in ALF and was mostly independent with ADLs, with min assist for bathing. Today pt is requiring set up  to mod assist for  ADLs. Bed mobility and functional transfers are  mod assist. Pt requiring increased cueing for sequencing, difficulty following commands. Recommendation of <3 hours of skilled rehab daily to return to PLOF. OT will continue to follow acutely to maximize functional independence.        If plan is discharge home, recommend the following:   A little help with walking and/or transfers;A lot of help with bathing/dressing/bathroom     Functional Status Assessment   Patient has had a recent decline in their functional status and demonstrates the ability to make significant improvements in function in a reasonable and predictable amount of time.     Equipment Recommendations   Other (comment) (Defer to next venue)     Recommendations for Other Services         Precautions/Restrictions   Precautions Precautions: Fall Recall of Precautions/Restrictions: Impaired Restrictions Weight Bearing Restrictions Per Provider Order: No     Mobility Bed Mobility Overal bed mobility: Needs Assistance Bed Mobility: Sit to Supine       Sit to supine: Min assist, HOB elevated, Used rails   General bed mobility comments: Min assist for BLEs    Transfers Overall transfer level: Needs assistance Equipment used: Rolling walker (2  wheels) Transfers: Sit to/from Stand, Bed to chair/wheelchair/BSC Sit to Stand: Mod assist     Step pivot transfers: Min assist     General transfer comment: x2 trials to come to stand, heavy cueing for sequencing      Balance Overall balance assessment: Needs assistance Sitting-balance support: Bilateral upper extremity supported, Feet supported Sitting balance-Leahy Scale: Fair     Standing balance support: Bilateral upper extremity supported, Reliant on assistive device for balance, During functional activity Standing balance-Leahy Scale: Poor Standing balance comment: Reliant on RW       ADL either performed or assessed with clinical judgement   ADL Overall ADL's : Needs assistance/impaired Eating/Feeding: Set up;Sitting   Grooming: Set up;Sitting   Upper Body Bathing: Set up;Sitting   Lower Body Bathing: Moderate assistance;Sit to/from stand   Upper Body Dressing : Set up;Sitting   Lower Body Dressing: Moderate assistance;Sit to/from stand Lower Body Dressing Details (indicate cue type and reason): Increased time and difficulty reaching BLEs Toilet Transfer: Minimal assistance;Ambulation;Rolling walker (2 wheels) Toilet Transfer Details (indicate cue type and reason): Simulated in room         Functional mobility during ADLs: Minimal assistance;Rolling walker (2 wheels)       Vision Baseline Vision/History: 0 No visual deficits Vision Assessment?: No apparent visual deficits            Pertinent Vitals/Pain Pain Assessment Pain Assessment: Faces Faces Pain Scale: Hurts little more Pain Location: None stated Pain Descriptors / Indicators: Grimacing Pain Intervention(s): Limited activity within patient's tolerance     Extremity/Trunk Assessment Upper Extremity Assessment Upper Extremity  Assessment: RUE deficits/detail RUE Deficits / Details: Decreased shoulder AROM and grip strength RUE Coordination: decreased fine motor   Lower Extremity  Assessment Lower Extremity Assessment: Defer to PT evaluation       Communication Communication Communication: Impaired Factors Affecting Communication: Difficulty expressing self;Hearing impaired   Cognition Arousal: Alert Behavior During Therapy: Flat affect Cognition: History of cognitive impairments       OT - Cognition Comments: Pt CVA with residual aphasia         Following commands: Impaired Following commands impaired: Follows one step commands with increased time, Follows one step commands inconsistently     Cueing  General Comments   Cueing Techniques: Verbal cues;Gestural cues;Tactile cues  VSS on 2L [place], son present for session and supportive           Home Living Family/patient expects to be discharged to:: Assisted living   Home Equipment: Agricultural consultant (2 wheels)          Prior Functioning/Environment Prior Level of Function : Needs assist         Mobility Comments: Walks to dining hall with RW ADLs Comments: Per son min assist with bathing    OT Problem List: Decreased strength;Decreased range of motion;Decreased activity tolerance;Impaired balance (sitting and/or standing)   OT Treatment/Interventions: Self-care/ADL training;Therapeutic exercise;Energy conservation;DME and/or AE instruction;Therapeutic activities;Patient/family education;Balance training      OT Goals(Current goals can be found in the care plan section)   Acute Rehab OT Goals Patient Stated Goal: To return to PLOF OT Goal Formulation: With patient/family Time For Goal Achievement: 02/13/24 Potential to Achieve Goals: Good   OT Frequency:  Min 2X/week       AM-PAC OT "6 Clicks" Daily Activity     Outcome Measure Help from another person eating meals?: None Help from another person taking care of personal grooming?: A Little Help from another person toileting, which includes using toliet, bedpan, or urinal?: A Lot Help from another person bathing (including  washing, rinsing, drying)?: A Lot Help from another person to put on and taking off regular upper body clothing?: A Little Help from another person to put on and taking off regular lower body clothing?: A Lot 6 Click Score: 16   End of Session Equipment Utilized During Treatment: Gait belt;Rolling walker (2 wheels) Nurse Communication: Mobility status  Activity Tolerance: Patient tolerated treatment well Patient left: in bed;with call bell/phone within reach;with bed alarm set  OT Visit Diagnosis: Unsteadiness on feet (R26.81);Other abnormalities of gait and mobility (R26.89);Muscle weakness (generalized) (M62.81)                Time: 9811-9147 OT Time Calculation (min): 23 min Charges:  OT General Charges $OT Visit: 1 Visit OT Evaluation $OT Eval Moderate Complexity: 1 Mod Peggy Williams, OT  Acute Rehabilitation Services Office [phone] Secure chat preferred   Peggy Williams 01/30/2024, 4:47 PM

## 2024-01-30 NOTE — TOC Initial Note (Addendum)
 Transition of Care [facility]) - Initial/Assessment Note    Patient Details  Name: Peggy Williams MRN: [id number] Date of Birth: 27-Sep-1928  [facility] CM/SW Contact:    Elspeth Hals, LCSW Phone Number: 01/30/2024, 2:19 PM  Clinical Narrative:   Pt oriented x1, CSW spoke with son [name] also in the room.  Pt from ALF at [facility] and [name] and his siblings are planning for her to return.  They are open to return to SNF side if recommended.    1440: Spoke with OT, some question about return to SNF at [facility] vs return to ALF at [facility] with HHPT.  CSW spoke with Brittany/[facility] and they are requesting that pt come to the SNF side for rehab.  CSW spoke with son [name] again and he will discuss with his sister but said they are open to this.  Main concern is that pt not go to a new facility, as long as she returns to [facility] is agreeable.              Medicare payer with inpt order: 01/27/24.  Expected Discharge Plan: Skilled Nursing Facility Barriers to Discharge: Continued Medical Work up   Patient Goals and CMS Choice     Choice offered to / list presented to : Adult Children [place] ownership interest in [facility]provided to:: Adult Children (son [name])    Expected Discharge Plan and Services In-house Referral: Clinical Social Work   Post Acute Care Choice: Skilled Nursing Facility Living arrangements for the past 2 months: Assisted Living Facility Office manager)                                      Prior Living Arrangements/Services Living arrangements for the past 2 months: Assisted Living Facility Office manager) Lives with:: Facility Resident Patient language and need for interpreter reviewed:: Yes        Need for Family Participation in Patient Care: Yes (Comment) Care giver support system in place?: Yes (comment) Current home services: Other (comment) (na) Criminal Activity/Legal Involvement Pertinent to Current  Situation/Hospitalization: No - Comment as needed  Activities of Daily Living   ADL Screening (condition at time of admission) Independently performs ADLs?: Yes (appropriate for developmental age) Is the patient deaf or have difficulty hearing?: Yes Does the patient have difficulty seeing, even when wearing glasses/contacts?: No Does the patient have difficulty concentrating, remembering, or making decisions?: Yes  Permission Sought/Granted                  Emotional Assessment Appearance:: Appears stated age Attitude/Demeanor/Rapport: Engaged Affect (typically observed): Appropriate, Pleasant Orientation: : Oriented to Self      Admission diagnosis:  Fall, initial encounter [W19.XXXA] Fall at home, initial encounter [phone].Benny Braver, Y92.009] Patient Active Problem List   Diagnosis Date Noted   Primary hypertension 01/28/2024   Fall at home, initial encounter 01/27/2024   Anxiety 08/08/2022   Hyperglycemia 07/27/2022   Acquired hypothyroidism 07/27/2022   Primary osteoarthritis of both knees 07/27/2022   BMI 24.0-24.9, adult 07/27/2022   Stage 3 chronic kidney disease (HCC) 07/27/2022   Hypercholesterolemia 07/27/2022   Cerebrovascular disease 07/27/2022   Atrial fibrillation (HCC) 07/27/2022   Dysarthria 07/27/2022   New onset atrial flutter (HCC) 07/17/2020   History of CVA (cerebrovascular accident) 07/17/2020   Shingles 07/17/2020   DNR (do not resuscitate) 07/17/2020   PCP:  Tita Form, MD Pharmacy:  [facility] [place], [place] - [address] [facility] [address] [place] [address] Phone: [phone] Fax: [phone]  [facility] - [place], [place] - [address] [place] [address] Phone: [phone] Fax: [phone]  [id number] - [place], [place] - [address] AT [facility] [address] [place]  [address] Phone: [phone] Fax: [phone]     Social Drivers of Health (SDOH) Social History: SDOH Screenings   Food Insecurity: No Food Insecurity (01/27/2024)  Housing: Low Risk  (01/27/2024)  Transportation Needs: No Transportation Needs (01/27/2024)  Utilities: Not At Risk (01/27/2024)  Social Connections: Moderately Isolated (01/27/2024)  Tobacco Use: Medium Risk (01/29/2024)   SDOH Interventions:     Readmission Risk Interventions     No data to display

## 2024-01-30 NOTE — Plan of Care (Signed)
  Problem: Health Behavior/Discharge Planning: Goal: Ability to manage health-related needs will improve Outcome: Progressing   Problem: Clinical Measurements: Goal: Ability to maintain clinical measurements within normal limits will improve Outcome: Progressing Goal: Will remain free from infection Outcome: Progressing Goal: Diagnostic test results will improve Outcome: Progressing Goal: Respiratory complications will improve Outcome: Progressing Goal: Cardiovascular complication will be avoided Outcome: Progressing   Problem: Coping: Goal: Level of anxiety will decrease Outcome: Progressing   Problem: Elimination: Goal: Will not experience complications related to bowel motility Outcome: Progressing Goal: Will not experience complications related to urinary retention Outcome: Progressing   Problem: Pain Managment: Goal: General experience of comfort will improve and/or be controlled Outcome: Progressing   Problem: Safety: Goal: Ability to remain free from injury will improve Outcome: Progressing   Problem: Skin Integrity: Goal: Risk for impaired skin integrity will decrease Outcome: Progressing

## 2024-01-31 DIAGNOSIS — Y92009 Unspecified place in unspecified non-institutional (private) residence as the place of occurrence of the external cause: Secondary | ICD-10-CM | POA: Diagnosis not present

## 2024-01-31 DIAGNOSIS — I10 Essential (primary) hypertension: Secondary | ICD-10-CM | POA: Diagnosis not present

## 2024-01-31 DIAGNOSIS — W19XXXA Unspecified fall, initial encounter: Secondary | ICD-10-CM | POA: Diagnosis not present

## 2024-01-31 LAB — CBC
HCT: 41.8 % (ref 36.0–46.0)
Hemoglobin: 14.1 g/dL (ref 12.0–15.0)
MCH: 31 pg (ref 26.0–34.0)
MCHC: 33.7 g/dL (ref 30.0–36.0)
MCV: 91.9 fL (ref 80.0–100.0)
Platelets: 349 10*3/uL (ref 150–400)
RBC: 4.55 MIL/uL (ref 3.87–5.11)
RDW: 13.4 % (ref 11.5–15.5)
WBC: 16.3 10*3/uL — ABNORMAL HIGH (ref 4.0–10.5)
nRBC: 0 % (ref 0.0–0.2)

## 2024-01-31 LAB — COMPREHENSIVE METABOLIC PANEL WITH GFR
ALT: 36 U/L (ref 0–44)
AST: 21 U/L (ref 15–41)
Albumin: 2.3 g/dL — ABNORMAL LOW (ref 3.5–5.0)
Alkaline Phosphatase: 82 U/L (ref 38–126)
Anion gap: 7 (ref 5–15)
BUN: 23 mg/dL (ref 8–23)
CO2: 28 mmol/L (ref 22–32)
Calcium: 9 mg/dL (ref 8.9–10.3)
Chloride: 103 mmol/L (ref 98–111)
Creatinine, Ser: 1 mg/dL (ref 0.44–1.00)
GFR, Estimated: 53 mL/min — ABNORMAL LOW (ref >60)
Glucose, Bld: 142 mg/dL — ABNORMAL HIGH (ref 70–99)
Potassium: 4.6 mmol/L (ref 3.5–5.1)
Sodium: 138 mmol/L (ref 135–145)
Total Bilirubin: 0.7 mg/dL (ref 0.0–1.2)
Total Protein: 5.5 g/dL — ABNORMAL LOW (ref 6.5–8.1)

## 2024-01-31 LAB — SURGICAL PATHOLOGY

## 2024-01-31 MED ORDER — POLYETHYLENE GLYCOL 3350 17 G PO PACK
17.0000 g | PACK | Freq: Every day | ORAL | Status: DC | PRN
  Administered 2024-01-31: 17 g via ORAL
  Filled 2024-01-31: qty 1

## 2024-01-31 NOTE — Plan of Care (Signed)
  Problem: Health Behavior/Discharge Planning: Goal: Ability to manage health-related needs will improve Outcome: Progressing   Problem: Activity: Goal: Risk for activity intolerance will decrease Outcome: Progressing   Problem: Nutrition: Goal: Adequate nutrition will be maintained Outcome: Progressing   Problem: Coping: Goal: Level of anxiety will decrease Outcome: Progressing   Problem: Pain Managment: Goal: General experience of comfort will improve and/or be controlled Outcome: Progressing

## 2024-01-31 NOTE — Progress Notes (Signed)
 PROGRESS NOTE    Peggy Williams  BJY:[id number] DOB: Apr 04, 1929 DOA: 01/27/2024 PCP: Tita Form, MD    Brief Narrative:   Peggy Williams is a 89 y.o. female with past medical history significant for hypothyroidism, remote aneurysm with post procedure CVA with chronic word-finding difficulties who presented to [facility] ED on 01/27/2024 via EMS from [facility]F after being found lying on the ground from a unwitnessed fall.  At baseline per family, patient is ambulatory with a walker at baseline and fairly independence regarding her ADLs.  In the ED, temperature 97.6 F, HR 77, RR 20, BP 118/62, SpO2 90% on room air.  WBC 24.9, hemoglobin 16.7, platelet count 283.  Sodium 139, potassium 3.7, chloride 102, CO2 26, glucose 120, BUN 15, Cram 1.24.  AST 55, ALT 101, total bilirubin 2.4.  Procalcitonin 0.59.  CT head without contrast with no acute intracranial malady noted stable meningioma left aneurysm clip measuring 3.6 x 3.0 x 2.4 cm, stable encephalomalacia throughout much of the left hemisphere, atrophy and chronic microvascular disease.  CT C-spine without contrast with no fracture/subluxation, noted advanced degenerative disc disease/facet disease with multilevel level bilateral neuronal foraminal narrowing.  CT T/L-spine without contrast with no acute bony abnormality.  Pelvics x-ray with no evidence of fracture.  CT chest/abdomen/pelvis with contrast with no evidence of acute traumatic injury in the C/A/P, findings highly suspicious for acute cholecystitis, bandlike opacity right lower lobe with rounded component, diminished from 2021 favors scarring, subpleural nodularity lingula and left lateral lobe appear tubular and may resent chronic mucoid impaction, consider CT follow-up 3 months.  Patient was started on IV antibiotics.  General surgery consulted.  TRH consulted for admission for further evaluation and management of acute cholecystitis.  Assessment & Plan:    Acute cholecystitis CT  chest/abdomen/pelvis on admission with findings concerning for acute cholecystitis.  Patient was afebrile but with elevated WBC count of 24.9.  General surgery was consulted and patient underwent a laparoscopic cholecystectomy by Dr. Davonna Estes on 01/29/2024 -- WBC 14.9>>11.8 -- AST 55>32>34>35 -- ALT 101>62>55>49 -- Tbili 2.4>2.1>2.2>1.1 -- Diet advanced to heart healthy today -- Tylenol  1 g p.o. every 8 hours scheduled -- Oxycodone  2.5-5 mg PO q6h PRN moderate pain -- Surgical pathology: Pending -- Continue Zosyn , transition to Augmentin on discharge for 5-day postoperative antibiotic course per [facility]CS -- Incentive spirometry; mobilization  Mechanical fall From [facility] assisted living. -- PT/OT recommend SNF placement, awaiting bed availability at [facility]  Hypothyroidism -- Levothyroxine  50 mcg p.o. daily  Paroxysmal atrial fibrillation -- Diltiazem  CD2 140 mg p.o. daily -- Metoprolol  succinate 12.5 m p.o. daily -- Digoxin  0.125 mg p.o. daily -- Apixaban  5 mg p.o. twice daily  History of remote aneurysm with postprocedure CVA Patient with chronic word finding difficulties. -- Supportive care  DVT prophylaxis:  apixaban  (ELIQUIS ) tablet 5 mg    Code Status: Limited: Do not attempt resuscitation (DNR) -DNR-LIMITED -Do Not Intubate/DNI  Family Communication: Updated son/daughter-in-law present at bedside this morning  Disposition Plan:  Level of care: Telemetry Medical Status is: Inpatient Remains inpatient appropriate because: Awaiting bed availability at SNF    Consultants:  General Surgery Cardiology  Procedures:  TTE Laparoscopic cholecystectomy, Dr. Davonna Estes 5/20  Antimicrobials:  Zosyn    Subjective: Patient seen examined bedside, lying in bed.  Son and daughter-in-law present at bedside.  Sleeping but arousable.  Reportedly ate a good deal of her breakfast this morning.  Awaiting to work with PT later this morning.  Discussed with social work, bed  not available  today at [facility], anticipate tomorrow. No other specific questions or concerns at this time.  Denies chest pain, no shortness of breath, no abdominal pain, no fever.  No acute events overnight per nursing staff.  Objective: Vitals:   01/30/24 2100 01/31/24 0433 01/31/24 0803 01/31/24 1118  BP: 125/60 121/64 (!) 128/52   Pulse: (!) 57 67 (!) 45 72  Resp: 18 17 18    Temp: 97.6 F (36.4 C) 97.7 F (36.5 C) (!) 97.2 F (36.2 C)   TempSrc: Oral Axillary    SpO2: 96% 98% 100%   Weight:  65.3 kg    Height:        Intake/Output Summary (Last 24 hours) at 01/31/2024 1122 Last data filed at 01/31/2024 0900 Gross per 24 hour  Intake 231.4 ml  Output 1100 ml  Net -868.6 ml   Filed Weights   01/29/24 0945 01/30/24 0422 01/31/24 0433  Weight: 64 kg 62.9 kg 65.3 kg    Examination:  Physical Exam: GEN: NAD, alert and oriented x 3, elderly in appearance HEENT: NCAT, PERRL, EOMI, sclera clear, MMM PULM: CTAB w/o wheezes/crackles, normal respiratory effort, on 2 L nasal cannula CV: RRR w/o M/G/R GI: abd soft, slight TTP surrounding surgical incision site with intact glue, well-approximated without drainage or surrounding erythema/fluctuance,, + mild distention, + BS MSK: no peripheral edema, moving all remedies independently NEURO: No focal neurological deficit PSYCH: normal mood/affect Integumentary: No concerning rashes/lesions/wounds noted on exposed skin surfaces    Data Reviewed: I have personally reviewed following labs and imaging studies  CBC: Recent Labs  Lab 01/27/24 1648 01/27/24 1651 01/28/24 0805 01/29/24 0118 01/30/24 0318 01/31/24 0255  WBC 24.9*  --  20.7* 18.2* 11.8* 16.3*  NEUTROABS 21.7*  --   --   --   --   --   HGB 16.7* 16.7* 15.8* 15.1* 15.2* 14.1  HCT 48.5* 49.0* 45.9 45.0 46.0 41.8  MCV 91.0  --  91.1 92.0 92.7 91.9  PLT 283  --  233 255 327 349   Basic Metabolic Panel: Recent Labs  Lab 01/27/24 1648 01/27/24 1651 01/28/24 0805 01/29/24 0118  01/29/24 0930 01/30/24 0318 01/31/24 0255  NA 139 139 138 139  --  139 138  K 3.7 3.7 3.3* 3.2*  --  4.9 4.6  CL 102 102 106 103  --  103 103  CO2 26  --  21* 26  --  26 28  GLUCOSE 120* 118* 107* 107*  --  137* 142*  BUN 15 19 11 11   --  15 23  CREATININE 1.24* 1.30* 0.82 0.86  --  0.88 1.00  CALCIUM 9.8  --  8.8* 8.9  --  9.2 9.0  MG  --   --   --   --  1.9  --   --    GFR: Estimated Creatinine Clearance: 31 mL/min (by C-G formula based on SCr of 1 mg/dL). Liver Function Tests: Recent Labs  Lab 01/27/24 1648 01/28/24 0805 01/29/24 0118 01/30/24 0318 01/31/24 0255  AST 55* 32 34 35 21  ALT 101* 62* 55* 49* 36  ALKPHOS 114 90 124 102 82  BILITOT 2.4* 2.1* 2.2* 1.1 0.7  PROT 6.9 5.8* 6.1* 5.9* 5.5*  ALBUMIN 3.7 2.7* 2.6* 2.4* 2.3*   No results for input(s): "LIPASE", "AMYLASE" in the last 168 hours. No results for input(s): "AMMONIA" in the last 168 hours. Coagulation Profile: No results for input(s): "INR", "PROTIME" in the last 168 hours. Cardiac  Enzymes: No results for input(s): "CKTOTAL", "CKMB", "CKMBINDEX", "TROPONINI" in the last 168 hours. BNP (last 3 results) No results for input(s): "PROBNP" in the last 8760 hours. HbA1C: No results for input(s): "HGBA1C" in the last 72 hours. CBG: No results for input(s): "GLUCAP" in the last 168 hours. Lipid Profile: No results for input(s): "CHOL", "HDL", "LDLCALC", "TRIG", "CHOLHDL", "LDLDIRECT" in the last 72 hours. Thyroid  Function Tests: No results for input(s): "TSH", "T4TOTAL", "FREET4", "T3FREE", "THYROIDAB" in the last 72 hours. Anemia Panel: No results for input(s): "VITAMINB12", "FOLATE", "FERRITIN", "TIBC", "IRON", "RETICCTPCT" in the last 72 hours. Sepsis Labs: Recent Labs  Lab 01/27/24 1648  PROCALCITON 0.59    Recent Results (from the past 240 hours)  Blood culture (routine x 2)     Status: None (Preliminary result)   Collection Time: 01/28/24  8:05 AM   Specimen: BLOOD LEFT HAND  Result Value Ref  Range Status   Specimen Description BLOOD LEFT HAND  Final   Special Requests   Final    BOTTLES DRAWN AEROBIC AND ANAEROBIC Blood Culture results may not be optimal due to an inadequate volume of blood received in culture bottles   Culture   Final    NO GROWTH 3 DAYS Performed at [facility], [address], [place], [place] [address]    Report Status PENDING  Incomplete  Blood culture (routine x 2)     Status: None (Preliminary result)   Collection Time: 01/28/24  8:07 AM   Specimen: BLOOD RIGHT HAND  Result Value Ref Range Status   Specimen Description BLOOD RIGHT HAND  Final   Special Requests   Final    BOTTLES DRAWN AEROBIC AND ANAEROBIC Blood Culture adequate volume   Culture   Final    NO GROWTH 3 DAYS Performed at [facility], [address], [place], [place] [address]    Report Status PENDING  Incomplete         Radiology Studies: No results found.       Scheduled Meds:  acetaminophen   1,000 mg Oral Q8H   apixaban   5 mg Oral BID   digoxin   0.125 mg Oral Daily   diltiazem   240 mg Oral Daily   feeding supplement  1 Container Oral TID BM   levothyroxine   50 mcg Oral Q0600   metoprolol  succinate  12.5 mg Oral Daily   pantoprazole   40 mg Oral Q supper   sodium chloride  flush  10-40 mL Intracatheter Q12H   sodium chloride  flush  3 mL Intravenous Q12H   sodium chloride  flush  3-10 mL Intravenous Q12H   Continuous Infusions:  piperacillin -tazobactam (ZOSYN )  IV 3.375 g (01/31/24 0512)     LOS: 4 days    Time spent: 45 minutes spent on 01/31/2024 caring for this patient face-to-face including chart review, ordering labs/tests, documenting, discussion with nursing staff, consultants, updating family and interview/physical exam    Rema Care [place], DO [facility] Available via Epic secure chat 7am-7pm After these hours, please refer to coverage provider listed on [email or web address] 01/31/2024, 11:22 AM

## 2024-01-31 NOTE — Progress Notes (Signed)
 Physical Therapy Treatment Patient Details Name: Peggy Williams MRN: [id number] DOB: 05-Jan-1929 Today's Date: 01/31/2024   History of Present Illness Peggy Williams is a 89 y.o. female brought in for a fall that was unwitnessed; with acute cholecystitis, s/p Lap Chole on 5/20; with past medical history of hypothyroidism and very remote aneurysm with post-procedure CVA (has chronic word-finding difficulties)    PT Comments  Pt received in supine and agreeable to session. Pt demonstrates good progress towards functional mobility goals, requiring less assist this session. Pt able to sit to EOB with CGA and stand with min A. Pt demonstrates instability during ambulation requiring min A to correct multiple LOB. Pt noted to stand outside of RW support multiple times requiring cues. Pt with difficulty expressing, however is able to follow simple commands consistently. Pt continues to benefit from PT services to progress toward functional mobility goals.    If plan is discharge home, recommend the following: A little help with walking and/or transfers;A lot of help with bathing/dressing/bathroom   Can travel by private vehicle        Equipment Recommendations  None recommended by PT    Recommendations for Other Services       Precautions / Restrictions Precautions Precautions: Fall Recall of Precautions/Restrictions: Impaired Restrictions Weight Bearing Restrictions Per Provider Order: No     Mobility  Bed Mobility Overal bed mobility: Needs Assistance Bed Mobility: Supine to Sit     Supine to sit: Contact guard     General bed mobility comments: increased time. increased difficulty advancing RLE, but no assist needed    Transfers Overall transfer level: Needs assistance Equipment used: Rolling walker (2 wheels) Transfers: Sit to/from Stand Sit to Stand: Min assist           General transfer comment: STS from EOB and recliner with min A for power up and cues for hand  placement    Ambulation/Gait Ambulation/Gait assistance: Min assist, +2 safety/equipment Gait Distance (Feet): 85 Feet Assistive device: Rolling walker (2 wheels) Gait Pattern/deviations: Step-through pattern, Trunk flexed, Decreased stride length, Narrow base of support Gait velocity: slowed     General Gait Details: Slow, short steps with pt able to improve with cues. Multiple mild LOB requiring min A to correct. Cues for RW proximity and upright posture. Noted narrow BOS with almost tandem steps at times   Stairs             Wheelchair Mobility     Tilt Bed    Modified Rankin (Stroke Patients Only)       Balance Overall balance assessment: Needs assistance Sitting-balance support: Bilateral upper extremity supported, Feet supported Sitting balance-Leahy Scale: Fair Sitting balance - Comments: sitting EOB   Standing balance support: Bilateral upper extremity supported, Reliant on assistive device for balance, During functional activity Standing balance-Leahy Scale: Poor Standing balance comment: Reliant on RW                            Communication Communication Communication: Impaired Factors Affecting Communication: Difficulty expressing self;Hearing impaired  Cognition Arousal: Alert Behavior During Therapy: WFL for tasks assessed/performed   PT - Cognitive impairments: History of cognitive impairments, Difficult to assess Difficult to assess due to: Impaired communication, Hard of hearing/deaf                       Following commands: Impaired Following commands impaired: Follows one step commands with increased time  Cueing Cueing Techniques: Verbal cues, Gestural cues, Tactile cues  Exercises      General Comments General comments (skin integrity, edema, etc.): SpO2 94% on RA sitting EOB. Unable to obtain SpO2 reading during ambulation on RA, however no adverse symptoms noted      Pertinent Vitals/Pain Pain  Assessment Pain Assessment: Faces Faces Pain Scale: Hurts a little bit Pain Location: None stated Pain Descriptors / Indicators: Grimacing Pain Intervention(s): Monitored during session     PT Goals (current goals can now be found in the care plan section) Acute Rehab PT Goals Patient Stated Goal: She had told her son earlier that she wanted to get up OOB PT Goal Formulation: With patient/family Time For Goal Achievement: 02/13/24 Progress towards PT goals: Progressing toward goals    Frequency    Min 4X/week       AM-PAC PT "6 Clicks" Mobility   Outcome Measure  Help needed turning from your back to your side while in a flat bed without using bedrails?: A Little Help needed moving from lying on your back to sitting on the side of a flat bed without using bedrails?: A Little Help needed moving to and from a bed to a chair (including a wheelchair)?: A Little Help needed standing up from a chair using your arms (e.g., wheelchair or bedside chair)?: A Little Help needed to walk in hospital room?: A Little Help needed climbing 3-5 steps with a railing? : A Lot 6 Click Score: 17    End of Session Equipment Utilized During Treatment: Gait belt Activity Tolerance: Patient tolerated treatment well Patient left: in chair;with call bell/phone within reach;with family/visitor present;with chair alarm set Nurse Communication: Mobility status PT Visit Diagnosis: Unsteadiness on feet (R26.81);Other abnormalities of gait and mobility (R26.89);Pain;Muscle weakness (generalized) (M62.81)     Time: 9563-8756 PT Time Calculation (min) (ACUTE ONLY): 29 min  Charges:    $Gait Training: 8-22 mins $Therapeutic Activity: 8-22 mins PT General Charges $$ ACUTE PT VISIT: 1 Visit                    Michaelle Adolphus, PTA Acute Rehabilitation Services Secure Chat Preferred  [phone]    Michaelle Adolphus 01/31/2024, 10:03 AM

## 2024-01-31 NOTE — TOC Progression Note (Signed)
 Transition of Care [facility]) - Progression Note    Patient Details  Name: TUNISHA RULAND MRN: [id number] Date of Birth: [birth year]  [facility]) CM/SW Contact  Elspeth Hals, LCSW Phone Number: 01/31/2024, 9:36 AM  Clinical Narrative:   No bed available today at [facility].      Expected Discharge Plan: Skilled Nursing Facility Barriers to Discharge: Continued Medical Work up  Expected Discharge Plan and Services In-house Referral: Clinical Social Work   Post Acute Care Choice: Skilled Nursing Facility Living arrangements for the past 2 months: Assisted Living Facility [facility])                                       Social Determinants of Health (SDOH) Interventions SDOH Screenings   Food Insecurity: No Food Insecurity (01/27/2024)  Housing: Low Risk  (01/27/2024)  Transportation Needs: No Transportation Needs (01/27/2024)  Utilities: Not At Risk (01/27/2024)  Social Connections: Moderately Isolated (01/27/2024)  Tobacco Use: Medium Risk (01/29/2024)    Readmission Risk Interventions     No data to display

## 2024-02-01 DIAGNOSIS — E039 Hypothyroidism, unspecified: Secondary | ICD-10-CM | POA: Diagnosis not present

## 2024-02-01 DIAGNOSIS — K810 Acute cholecystitis: Secondary | ICD-10-CM | POA: Diagnosis not present

## 2024-02-01 DIAGNOSIS — Z9049 Acquired absence of other specified parts of digestive tract: Secondary | ICD-10-CM | POA: Diagnosis not present

## 2024-02-01 DIAGNOSIS — R2681 Unsteadiness on feet: Secondary | ICD-10-CM | POA: Diagnosis not present

## 2024-02-01 DIAGNOSIS — R531 Weakness: Secondary | ICD-10-CM | POA: Diagnosis not present

## 2024-02-01 DIAGNOSIS — W19XXXA Unspecified fall, initial encounter: Secondary | ICD-10-CM | POA: Diagnosis not present

## 2024-02-01 DIAGNOSIS — I480 Paroxysmal atrial fibrillation: Secondary | ICD-10-CM | POA: Diagnosis not present

## 2024-02-01 DIAGNOSIS — N183 Chronic kidney disease, stage 3 unspecified: Secondary | ICD-10-CM | POA: Diagnosis not present

## 2024-02-01 DIAGNOSIS — I639 Cerebral infarction, unspecified: Secondary | ICD-10-CM | POA: Diagnosis not present

## 2024-02-01 DIAGNOSIS — M6281 Muscle weakness (generalized): Secondary | ICD-10-CM | POA: Diagnosis not present

## 2024-02-01 DIAGNOSIS — Y92009 Unspecified place in unspecified non-institutional (private) residence as the place of occurrence of the external cause: Secondary | ICD-10-CM | POA: Diagnosis not present

## 2024-02-01 DIAGNOSIS — I4891 Unspecified atrial fibrillation: Secondary | ICD-10-CM | POA: Diagnosis not present

## 2024-02-01 DIAGNOSIS — R2689 Other abnormalities of gait and mobility: Secondary | ICD-10-CM | POA: Diagnosis not present

## 2024-02-01 DIAGNOSIS — I10 Essential (primary) hypertension: Secondary | ICD-10-CM | POA: Diagnosis not present

## 2024-02-01 LAB — COMPREHENSIVE METABOLIC PANEL WITH GFR
ALT: 26 U/L (ref 0–44)
AST: 24 U/L (ref 15–41)
Albumin: 2.1 g/dL — ABNORMAL LOW (ref 3.5–5.0)
Alkaline Phosphatase: 73 U/L (ref 38–126)
Anion gap: 8 (ref 5–15)
BUN: 18 mg/dL (ref 8–23)
CO2: 25 mmol/L (ref 22–32)
Calcium: 8.4 mg/dL — ABNORMAL LOW (ref 8.9–10.3)
Chloride: 107 mmol/L (ref 98–111)
Creatinine, Ser: 0.84 mg/dL (ref 0.44–1.00)
GFR, Estimated: >60 mL/min (ref >60)
Glucose, Bld: 161 mg/dL — ABNORMAL HIGH (ref 70–99)
Potassium: 4.1 mmol/L (ref 3.5–5.1)
Sodium: 140 mmol/L (ref 135–145)
Total Bilirubin: 0.8 mg/dL (ref 0.0–1.2)
Total Protein: 5 g/dL — ABNORMAL LOW (ref 6.5–8.1)

## 2024-02-01 LAB — CBC
HCT: 37.9 % (ref 36.0–46.0)
Hemoglobin: 12.6 g/dL (ref 12.0–15.0)
MCH: 31.3 pg (ref 26.0–34.0)
MCHC: 33.2 g/dL (ref 30.0–36.0)
MCV: 94 fL (ref 80.0–100.0)
Platelets: 265 10*3/uL (ref 150–400)
RBC: 4.03 MIL/uL (ref 3.87–5.11)
RDW: 13.7 % (ref 11.5–15.5)
WBC: 10.3 10*3/uL (ref 4.0–10.5)
nRBC: 0 % (ref 0.0–0.2)

## 2024-02-01 MED ORDER — ACETAMINOPHEN 500 MG PO TABS: 500.0000 mg | ORAL_TABLET | Freq: Four times a day (QID) | ORAL | Status: AC | PRN

## 2024-02-01 MED ORDER — AMOXICILLIN-POT CLAVULANATE 875-125 MG PO TABS: 1.0000 | ORAL_TABLET | Freq: Two times a day (BID) | ORAL | 0 refills | Status: AC

## 2024-02-01 MED ORDER — OXYCODONE HCL 5 MG PO TABS: 2.5000 mg | ORAL_TABLET | Freq: Three times a day (TID) | ORAL | 0 refills | Status: AC | PRN

## 2024-02-01 NOTE — TOC Progression Note (Signed)
 Transition of Care [facility]) - Progression Note    Patient Details  Name: Peggy Williams MRN: [id number] Date of Birth: [birth year]  [facility] CM/SW Contact  Elspeth Hals, LCSW Phone Number: 02/01/2024, 10:13 AM  Clinical Narrative:   CSW confirmed with [facility] that they can receive pt today.  The discharge from that room at [facility] has not left yet, she will call when the room is ready.  1000: Per PT, no ambulance transport needed.  CSW spoke with son Peggy Williams and he can provide transport.     Expected Discharge Plan: Skilled Nursing Facility Barriers to Discharge: Continued Medical Work up  Expected Discharge Plan and Services In-house Referral: Clinical Social Work   Post Acute Care Choice: Skilled Nursing Facility Living arrangements for the past 2 months: Assisted Living Facility [place]) Expected Discharge Date: 02/01/24                                     Social Determinants of Health (SDOH) Interventions SDOH Screenings   Food Insecurity: No Food Insecurity (01/27/2024)  Housing: Low Risk  (01/27/2024)  Transportation Needs: No Transportation Needs (01/27/2024)  Utilities: Not At Risk (01/27/2024)  Social Connections: Moderately Isolated (01/27/2024)  Tobacco Use: Medium Risk (01/29/2024)    Readmission Risk Interventions     No data to display

## 2024-02-01 NOTE — Discharge Summary (Signed)
 Physician Discharge Summary  Peggy Williams NWG:[id number] DOB: 1928-12-10 DOA: 01/27/2024  PCP: Tita Form, MD  Admit date: 01/27/2024 Discharge date: 02/01/2024  Admitted From: [facility] Disposition: [facility]  Recommendations for Outpatient Follow-up:  Follow up with PCP in 1-2 weeks Follow-up with general surgery as scheduled on 02/19/2024 at 1:30 PM Continue Augmentin to complete 5-day postoperative antibiotic course per general surgery   Discharge Condition: Stable CODE STATUS: DNR Diet recommendation: Heart healthy diet  History of present illness:  Peggy Williams is a 89 y.o. female with past medical history significant for hypothyroidism, remote aneurysm with post procedure CVA with chronic word-finding difficulties who presented to [facility] ED on 01/27/2024 via EMS from [facility] after being found lying on the ground from a unwitnessed fall.  At baseline per family, patient is ambulatory with a walker at baseline and fairly independence regarding her ADLs.   In the ED, temperature 97.6 F, HR 77, RR 20, BP 118/62, SpO2 90% on room air.  WBC 24.9, hemoglobin 16.7, platelet count 283.  Sodium 139, potassium 3.7, chloride 102, CO2 26, glucose 120, BUN 15, Cram 1.24.  AST 55, ALT 101, total bilirubin 2.4.  Procalcitonin 0.59.  CT head without contrast with no acute intracranial malady noted stable meningioma left aneurysm clip measuring 3.6 x 3.0 x 2.4 cm, stable encephalomalacia throughout much of the left hemisphere, atrophy and chronic microvascular disease.  CT C-spine without contrast with no fracture/subluxation, noted advanced degenerative disc disease/facet disease with multilevel level bilateral neuronal foraminal narrowing.  CT T/L-spine without contrast with no acute bony abnormality.  Pelvics x-ray with no evidence of fracture.  CT chest/abdomen/pelvis with contrast with no evidence of acute traumatic injury in the C/A/P, findings highly suspicious for acute  cholecystitis, bandlike opacity right lower lobe with rounded component, diminished from 2021 favors scarring, subpleural nodularity lingula and left lateral lobe appear tubular and may resent chronic mucoid impaction, consider CT follow-up 3 months.  Patient was started on IV antibiotics.  General surgery consulted.  TRH consulted for admission for further evaluation and management of acute cholecystitis.  Hospital course:  Acute cholecystitis CT chest/abdomen/pelvis on admission with findings concerning for acute cholecystitis.  Patient was afebrile but with elevated WBC count of 24.9.  General surgery was consulted and patient underwent a laparoscopic cholecystectomy by Dr. Davonna Estes on 01/29/2024.  Patient was continued on Zosyn  and will transition to Augmentin to complete 5-day postoperative antibiotic course per general surgery.  Tylenol /oxycodone  as needed for pain control.  Outpatient follow-up with general surgery as scheduled on 02/19/2024 at 1:30 PM.   Mechanical fall From [facility] assisted living.  Discharging back to [facility] under SNF services.   Hypothyroidism Levothyroxine  50 mcg p.o. daily   Paroxysmal atrial fibrillation Essential hypertension Amlodipine 0.5 mg p.o. daily, Diltiazem  CD2 140 mg p.o. daily, Metoprolol  succinate 12.5 mg p.o. daily, Digoxin  0.125 mg p.o. daily, furosemide  20 mg p.o. daily, apixaban  5 mg p.o. twice daily   History of remote aneurysm with postprocedure CVA Patient with chronic word finding difficulties.   Discharge Diagnoses:  Principal Problem:   Fall at home, initial encounter Active Problems:   History of CVA (cerebrovascular accident)   Acquired hypothyroidism   Stage 3 chronic kidney disease (HCC)   Atrial fibrillation (HCC)   Primary hypertension    Discharge Instructions  Discharge Instructions     Call MD for:  difficulty breathing, headache or visual disturbances   Complete by: As directed    Call MD for:  extreme fatigue    Complete by: As directed    Call MD for:  persistant dizziness or light-headedness   Complete by: As directed    Call MD for:  persistant nausea and vomiting   Complete by: As directed    Call MD for:  severe uncontrolled pain   Complete by: As directed    Call MD for:  temperature >100.4   Complete by: As directed    Diet - low sodium heart healthy   Complete by: As directed    Increase activity slowly   Complete by: As directed       Allergies as of 02/01/2024   No Known Allergies      Medication List     TAKE these medications    acetaminophen  500 MG tablet Commonly known as: TYLENOL  Take 1 tablet (500 mg total) by mouth every 6 (six) hours as needed for mild pain (pain score 1-3), fever or headache.   amLODipine 2.5 MG tablet Commonly known as: NORVASC Take 2.5 mg by mouth in the morning.   amoxicillin-clavulanate 875-125 MG tablet Commonly known as: AUGMENTIN Take 1 tablet by mouth 2 (two) times daily for 3 days.   apixaban  5 MG Tabs tablet Commonly known as: ELIQUIS  Take 1 tablet (5 mg total) by mouth 2 (two) times daily.   digoxin  0.125 MG tablet Commonly known as: LANOXIN  Take 1 tablet (0.125 mg total) by mouth daily. Please make overdue appt with Dr. Ardell Beauvais before anymore refills. Thank you 1st attempt What changed:  when to take this additional instructions   diltiazem  240 MG 24 hr capsule Commonly known as: CARDIZEM  CD Take 1 capsule (240 mg total) by mouth daily. Pt. Needs to make an overdue appt. With Cardiologist in order to receive future refills. Thank You. 2nd Attempt. What changed:  when to take this additional instructions   furosemide  20 MG tablet Commonly known as: LASIX  Take 1 tablet (20 mg total) by mouth daily. What changed: when to take this   K2-D3 10,000 10000-45 UNIT-MCG Caps Take 2 capsules by mouth in the morning.   levothyroxine  75 MCG tablet Commonly known as: SYNTHROID  Take 75 mcg by mouth daily before breakfast.    metoprolol  succinate 25 MG 24 hr tablet Commonly known as: TOPROL -XL Take 0.5 tablets (12.5 mg total) by mouth in the morning and at bedtime. Please make overdue appt with Dr. Ardell Beauvais Cardiologist before anymore refills. Thank you 3rd and Final Attempt What changed:  how much to take when to take this additional instructions   Ocuvite Adult 50+ Caps Take 1 capsule by mouth in the morning.   oxyCODONE  5 MG immediate release tablet Commonly known as: Roxicodone  Take 0.5-1 tablets (2.5-5 mg total) by mouth every 8 (eight) hours as needed for moderate pain (pain score 4-6) or severe pain (pain score 7-10).   polyethylene glycol powder 17 GM/SCOOP powder Commonly known as: GLYCOLAX/MIRALAX Take 17 g by mouth in the morning.   potassium chloride  10 MEQ tablet Commonly known as: KLOR-CON  Take one (1) tablet by mouth (10 meq) daily. 3RD FINAL   ATTEMPT. PT IS OVERDUE FOR AN APPT. CAN OK 30 DAYS SUPPLY. PLEASE HAVE PT CALL TO SCHEDULE appointment. What changed:  how much to take how to take this when to take this additional instructions        Follow-up Information     Maczis, Puja Gosai, PA-C Follow up on 02/19/2024.   Specialty: General Surgery Why: 1:30pm, Arrive 30 minutes prior to  your appointment time, Please bring your insurance card and photo ID Contact information: [address] [place] [address] [facility] [place] [address] [phone]         Tita Form, MD. Schedule an appointment as soon as possible for a visit in 1 week(s).   Specialty: Internal Medicine Contact information: [address] [place] [address] [phone]                No Known Allergies  Consultations: General Surgery   Procedures/Studies: ECHOCARDIOGRAM COMPLETE Result Date: 01/29/2024    ECHOCARDIOGRAM REPORT   Patient Name:   Peggy Williams Date of Exam: 01/29/2024 Medical Rec #:  [id number]     Height:       64.0 in Accession #:    [id number]     Weight:       141.1 lb Date of Birth:  07-Jul-1929     BSA:          1.687 m Patient Age:    89 years      BP:           138/103 mmHg Patient Gender: F             HR:           77 bpm. Exam Location:  Inpatient Procedure: 2D Echo, Cardiac Doppler and Color Doppler (Both Spectral and Color            Flow Doppler were utilized during procedure). Indications:    Preoperative evaluation  History:        Patient has prior history of Echocardiogram examinations, most                 recent 07/18/2020. Stroke, Arrythmias:Atrial Fibrillation and                 Atrial Flutter; Risk Factors:Hypertension. CKD stage 3.  Sonographer:    Juanita Shaw Referring Phys: [phone] NOAH BEDFORD WOUK IMPRESSIONS  1. Left ventricular ejection fraction, by estimation, is 65 to 70%. The left ventricle has normal function. The left ventricle has no regional wall motion abnormalities. There is mild left ventricular hypertrophy. Left ventricular diastolic parameters are indeterminate.  2. Right ventricular systolic function is normal. The right ventricular size is normal.  3. The mitral valve is normal in structure. No evidence of mitral valve regurgitation. No evidence of mitral stenosis. Moderate mitral annular calcification.  4. The aortic valve is calcified. There is mild calcification of the aortic valve. Aortic valve regurgitation is trivial. Aortic valve sclerosis is present, with no evidence of aortic valve stenosis. Aortic valve mean gradient measures 2.3 mmHg. Aortic valve Vmax measures 1.03 m/s.  5. The inferior vena cava is normal in size with greater than 50% respiratory variability, suggesting right atrial pressure of 3 mmHg. FINDINGS  Left Ventricle: Left ventricular ejection fraction, by estimation, is 65 to 70%. The left ventricle has normal function. The left ventricle has no regional wall motion abnormalities. The left ventricular internal cavity size was normal in size. There is  mild left ventricular hypertrophy. Left  ventricular diastolic parameters are indeterminate. Right Ventricle: The right ventricular size is normal. No increase in right ventricular wall thickness. Right ventricular systolic function is normal. Left Atrium: Left atrial size was normal in size. Right Atrium: Right atrial size was normal in size. Pericardium: There is no evidence of pericardial effusion. Mitral Valve: The mitral valve is normal in structure. Moderate mitral annular calcification. No  evidence of mitral valve regurgitation. No evidence of mitral valve stenosis. MV peak gradient, 1.9 mmHg. The mean mitral valve gradient is 1.0 mmHg. Tricuspid Valve: The tricuspid valve is normal in structure. Tricuspid valve regurgitation is not demonstrated. No evidence of tricuspid stenosis. Aortic Valve: The aortic valve is calcified. There is mild calcification of the aortic valve. Aortic valve regurgitation is trivial. Aortic valve sclerosis is present, with no evidence of aortic valve stenosis. Aortic valve mean gradient measures 2.3 mmHg. Aortic valve peak gradient measures 4.2 mmHg. Aortic valve area, by VTI measures 2.66 cm. Pulmonic Valve: The pulmonic valve was normal in structure. Pulmonic valve regurgitation is trivial. No evidence of pulmonic stenosis. Aorta: The aortic root is normal in size and structure. Venous: The inferior vena cava is normal in size with greater than 50% respiratory variability, suggesting right atrial pressure of 3 mmHg. IAS/Shunts: No atrial level shunt detected by color flow Doppler.  LEFT VENTRICLE PLAX 2D LVIDd:         3.90 cm     Diastology LVIDs:         2.40 cm     LV e' medial:    8.49 cm/s LV PW:         1.30 cm     LV E/e' medial:  8.1 LV IVS:        1.20 cm     LV e' lateral:   8.81 cm/s LVOT diam:     2.00 cm     LV E/e' lateral: 7.8 LV SV:         36 LV SV Index:   22 LVOT Area:     3.14 cm  LV Volumes (MOD) LV vol d, MOD A2C: 65.6 ml LV vol d, MOD A4C: 83.8 ml LV vol s, MOD A2C: 21.1 ml LV vol s, MOD A4C:  28.2 ml LV SV MOD A2C:     44.5 ml LV SV MOD A4C:     83.8 ml LV SV MOD BP:      49.7 ml RIGHT VENTRICLE RV Basal diam:  3.20 cm RV Mid diam:    1.80 cm RV S prime:     10.80 cm/s LEFT ATRIUM             Index        RIGHT ATRIUM          Index LA diam:        5.00 cm 2.96 cm/m   RA Area:     9.16 cm LA Vol (A2C):   44.7 ml 26.50 ml/m  RA Volume:   14.60 ml 8.66 ml/m LA Vol (A4C):   44.8 ml 26.56 ml/m LA Biplane Vol: 47.2 ml 27.98 ml/m  AORTIC VALVE                    PULMONIC VALVE AV Area (Vmax):    2.43 cm     PV Vmax:       0.73 m/s AV Area (Vmean):   2.34 cm     PV Peak grad:  2.1 mmHg AV Area (VTI):     2.66 cm AV Vmax:           102.60 cm/s AV Vmean:          70.000 cm/s AV VTI:            0.136 m AV Peak Grad:      4.2 mmHg AV Mean Grad:      2.3  mmHg LVOT Vmax:         79.45 cm/s LVOT Vmean:        52.050 cm/s LVOT VTI:          0.116 m LVOT/AV VTI ratio: 0.85  AORTA Ao Root diam: 3.20 cm Ao Asc diam:  3.50 cm MITRAL VALVE MV Area (PHT): 4.17 cm    SHUNTS MV Area VTI:   3.06 cm    Systemic VTI:  0.12 m MV Peak grad:  1.9 mmHg    Systemic Diam: 2.00 cm MV Mean grad:  1.0 mmHg MV Vmax:       0.68 m/s MV Vmean:      43.5 cm/s MV Decel Time: 182 msec MV E velocity: 68.70 cm/s Dorothye Gathers MD Electronically signed by Dorothye Gathers MD Signature Date/Time: 01/29/2024/9:26:53 AM    Final    CT CHEST ABDOMEN PELVIS W CONTRAST Result Date: 01/27/2024 CLINICAL DATA:  Blunt trauma.  Elevated bilirubin.  Leukocytosis. EXAM: CT CHEST, ABDOMEN, AND PELVIS WITH CONTRAST TECHNIQUE: Multidetector CT imaging of the chest, abdomen and pelvis was performed following the standard protocol during bolus administration of intravenous contrast. RADIATION DOSE REDUCTION: This exam was performed according to the departmental dose-optimization program which includes automated exposure control, adjustment of the mA and/or kV according to patient size and/or use of iterative reconstruction technique. CONTRAST:  75mL OMNIPAQUE   IOHEXOL  350 MG/ML SOLN COMPARISON:  Lumbar and thoracic spine CT performed earlier today. Chest CTA 07/17/2020. Noncontrast abdominal CT 12/13/2020 FINDINGS: CT CHEST FINDINGS Cardiovascular: No evidence of acute aortic or vascular injury. Diffuse calcified and noncalcified atheromatous plaque in the thoracic aorta the heart is mildly enlarged. There are coronary artery calcifications. No pericardial effusion. Mediastinum/Nodes: No mediastinal hemorrhage or hematoma. No mediastinal adenopathy. Patulous esophagus. No pneumomediastinum. Lungs/Pleura: No pneumothorax or pulmonary contusion. Sequela of chronic lung disease. Bandlike opacity in the right lower lobe which has a rounded component inferiorly, series 4, image 98. Representative measurement 19 x 18 mm, series 4, image 98. Overall this has diminished from 2021 chest CT and favor scarring. Additional areas of subpleural nodularity within the lingula and lateral left lower lobe series 4, image 89, 93, and 97 appear tubular and may represent chronic mucoid impaction. Additional areas of mucoid impaction are seen in the right middle lobe, series 4, image 102 this was present on prior exam with overall improved nodular opacities. No significant pleural effusion. The central airways are clear Musculoskeletal: Thoracic spine assessed on thoracic spine CT earlier today. Remote right anterior rib fractures. No acute rib fracture. No acute sternal fracture. CT ABDOMEN PELVIS FINDINGS Hepatobiliary: Motion artifact in the upper abdomen. There is no evidence of hepatic injury. Mild diffuse hepatic steatosis. The gallbladder is moderately distended. There is marked gallbladder wall thickening, for example series 3, image 67. Multiple punctate radiopaque densities in the gallbladder likely gallstones. Moderate pericholecystic fat stranding. No biliary dilatation. Pancreas: No evidence of injury. No ductal dilatation or inflammation. Spleen: No evidence of injury allowing  for motion. No splenomegaly or focal splenic abnormality. Adrenals/Urinary Tract: Adrenal thickening without dominant nodule or evidence of injury. No renal injury. No hydronephrosis. No evidence of renal inflammation. Left renal cyst. No further follow-up imaging is recommended. Unremarkable urinary bladder. Stomach/Bowel: No evidence of bowel injury or bowel inflammation. Stomach is decompressed. No small bowel obstruction. Normal appendix. Left colonic diverticulosis, no diverticulitis. Vascular/Lymphatic: No evidence of aortic or vascular injury. Moderate aortic atherosclerosis. No bulky abdominopelvic adenopathy. Reproductive: Status post hysterectomy. No adnexal masses.  Other: Right upper quadrant fat stranding with trace free fluid. No free air. Musculoskeletal: Lumbar spine assessed on same day lumbar spine CT. No acute pelvic fracture. IMPRESSION: 1. No evidence of acute traumatic injury in the chest, abdomen, or pelvis. 2. Findings highly suspicious for acute cholecystitis. 3. Sequela of chronic lung disease, favoring chronic indolent infection. Bandlike opacity in the right lower lobe has a rounded component inferiorly. Overall this has diminished from 2021 chest CT and favor scarring. Additional areas of subpleural nodularity within the lingula and lateral left lower lobe appear tubular and may represent chronic mucoid impaction. Consider CT follow-up in 3 months to assess for stability. Aortic Atherosclerosis (ICD10-I70.0). Electronically Signed   By: Chadwick Colonel M.D.   On: 01/27/2024 19:58   CT Lumbar Spine Wo Contrast Result Date: 01/27/2024 CLINICAL DATA:  Back trauma, no prior imaging (Age >= 16y).  Fall. EXAM: CT LUMBAR SPINE WITHOUT CONTRAST TECHNIQUE: Multidetector CT imaging of the lumbar spine was performed without intravenous contrast administration. Multiplanar CT image reconstructions were also generated. RADIATION DOSE REDUCTION: This exam was performed according to the departmental  dose-optimization program which includes automated exposure control, adjustment of the mA and/or kV according to patient size and/or use of iterative reconstruction technique. COMPARISON:  None Available. FINDINGS: Segmentation: 5 lumbar type vertebrae. Alignment: Convex rightward scoliosis. No subluxation. 4 mm degenerative anterolisthesis of L4 on L5. 2 mm degenerative anterolisthesis of L3 on L4. Slight retrolisthesis in the upper lumbar spine. Vertebrae: No fracture or focal bone lesion. Paraspinal and other soft tissues: Negative Disc levels: Advanced diffuse degenerative disc disease and facet disease. IMPRESSION: No acute bony abnormality. Scoliosis and degenerative changes in the lumbar spine. Electronically Signed   By: Janeece Mechanic M.D.   On: 01/27/2024 17:47   CT Thoracic Spine Wo Contrast Result Date: 01/27/2024 CLINICAL DATA:  Fall, back pain EXAM: CT THORACIC SPINE WITHOUT CONTRAST TECHNIQUE: Multidetector CT images of the thoracic were obtained using the standard protocol without intravenous contrast. RADIATION DOSE REDUCTION: This exam was performed according to the departmental dose-optimization program which includes automated exposure control, adjustment of the mA and/or kV according to patient size and/or use of iterative reconstruction technique. COMPARISON:  None Available. FINDINGS: Alignment: Slight retrolisthesis of T12 on L1 related to facet disease. Vertebrae: No acute fracture or focal pathologic process. Paraspinal and other soft tissues: Negative paraspinal soft tissues. Rounded airspace opacity in the right lower lobe along a area of platelike scarring. This could reflect nodular scarring. No effusions. Disc levels: Diffuse degenerative disc disease. No visible disc herniation. IMPRESSION: No acute bony abnormality.  Diffuse degenerative changes. Rounded opacity in the right lower lobe along an area of platelike scarring. The rounded opacity measures 2.1 x 1.7 cm. This could reflect  a component of scarring, but recommend follow-up to exclude mass. Follow-up chest CT in 3-6 months recommended. Electronically Signed   By: Janeece Mechanic M.D.   On: 01/27/2024 17:44   CT CERVICAL SPINE WO CONTRAST Result Date: 01/27/2024 CLINICAL DATA:  Polytrauma, blunt EXAM: CT CERVICAL SPINE WITHOUT CONTRAST TECHNIQUE: Multidetector CT imaging of the cervical spine was performed without intravenous contrast. Multiplanar CT image reconstructions were also generated. RADIATION DOSE REDUCTION: This exam was performed according to the departmental dose-optimization program which includes automated exposure control, adjustment of the mA and/or kV according to patient size and/or use of iterative reconstruction technique. COMPARISON:  None Available. FINDINGS: Alignment: No subluxation Skull base and vertebrae: No acute fracture. No primary bone lesion or focal  pathologic process. Soft tissues and spinal canal: No prevertebral fluid or swelling. No visible canal hematoma. Disc levels: Advanced degenerative disc disease most pronounced at C5-6 and C6-7. Advanced bilateral degenerative facet disease. Multilevel bilateral neural foraminal narrowing. Upper chest: No acute findings Other: None Electronically Signed   By: Janeece Mechanic M.D.   On: 01/27/2024 17:41   CT HEAD WO CONTRAST Result Date: 01/27/2024 CLINICAL DATA:  Head trauma, moderate-severe.  Fall. EXAM: CT HEAD WITHOUT CONTRAST TECHNIQUE: Contiguous axial images were obtained from the base of the skull through the vertex without intravenous contrast. RADIATION DOSE REDUCTION: This exam was performed according to the departmental dose-optimization program which includes automated exposure control, adjustment of the mA and/or kV according to patient size and/or use of iterative reconstruction technique. COMPARISON:  None Available. FINDINGS: Brain: Again noted is a hyperdense mass surrounding the left aneurysm clip measuring 3.6 x 3.0 x 2.4 cm, not  significantly changed since prior study. This is compatible with a meningioma. Extensive encephalomalacia throughout the left cerebral hemisphere, stable. No acute infarct or hemorrhage. There is atrophy and chronic small vessel disease changes. Vascular: No hyperdense vessel.  Left aneurysm clip noted. Skull: Prior left temporal craniotomy. No acute calvarial abnormality. Sinuses/Orbits: No acute findings Other: None IMPRESSION: No acute intracranial abnormality. Stable meningioma surrounding the left aneurysm clip measuring 3.6 x 3.0 x 2.4 cm. Stable encephalomalacia throughout much of the left cerebral hemisphere. Atrophy, chronic microvascular disease. Electronically Signed   By: Janeece Mechanic M.D.   On: 01/27/2024 17:39   DG Pelvis Portable Result Date: 01/27/2024 CLINICAL DATA:  Unwitnessed fall. EXAM: PORTABLE PELVIS 1-2 VIEWS COMPARISON:  October 13, 2023. FINDINGS: There is no evidence of pelvic fracture or diastasis. No pelvic bone lesions are seen. IMPRESSION: Negative. Electronically Signed   By: Rosalene Colon M.D.   On: 01/27/2024 16:59     Subjective: Patient seen examined bedside, lying in bed.  Eating breakfast.  Son present at bedside.  No complaints this morning.  Discharging to [facility] today.  Denies headache, no dizziness, no chest pain, no palpitations, no shortness of breath, no abdominal pain, no nausea/vomiting/diarrhea.  No acute events overnight per nursing staff.  Discharge Exam: Vitals:   02/01/24 0442 02/01/24 0833  BP: (!) 141/67 (!) 155/72  Pulse: (!) 53 (!) 54  Resp: 17 17  Temp: 97.7 F (36.5 C) (!) 97.5 F (36.4 C)  SpO2: (!) 89% 92%   Vitals:   01/31/24 2052 02/01/24 0439 02/01/24 0442 02/01/24 0833  BP: (!) 150/67  (!) 141/67 (!) 155/72  Pulse: 67  (!) 53 (!) 54  Resp: 15  17 17   Temp: 97.7 F (36.5 C)  97.7 F (36.5 C) (!) 97.5 F (36.4 C)  TempSrc:    Oral  SpO2: 95%  (!) 89% 92%  Weight:  66.3 kg    Height:        Physical Exam: GEN:  NAD, alert and oriented x 3, elderly in appearance HEENT: NCAT, PERRL, EOMI, sclera clear, MMM PULM: CTAB w/o wheezes/crackles, normal respiratory effort, on room air CV: RRR w/o M/G/R GI: abd soft, NTTP, noted surgical ports incision sites with intact glue, well-approximated without drainage or surrounding erythema/fluctuance, + BS MSK: no peripheral edema, moving all extremities independently NEURO: No focal neurological deficit PSYCH: normal mood/affect Integumentary: No concerning rashes/lesions/wounds noted on exposed skin surfaces    The results of significant diagnostics from this hospitalization (including imaging, microbiology, ancillary and laboratory) are listed below for reference.  Microbiology: Recent Results (from the past 240 hours)  Blood culture (routine x 2)     Status: None (Preliminary result)   Collection Time: 01/28/24  8:05 AM   Specimen: BLOOD LEFT HAND  Result Value Ref Range Status   Specimen Description BLOOD LEFT HAND  Final   Special Requests   Final    BOTTLES DRAWN AEROBIC AND ANAEROBIC Blood Culture results may not be optimal due to an inadequate volume of blood received in culture bottles   Culture   Final    NO GROWTH 4 DAYS Performed at [facility], [address] [place], [place] [address]    Report Status PENDING  Incomplete  Blood culture (routine x 2)     Status: None (Preliminary result)   Collection Time: 01/28/24  8:07 AM   Specimen: BLOOD RIGHT HAND  Result Value Ref Range Status   Specimen Description BLOOD RIGHT HAND  Final   Special Requests   Final    BOTTLES DRAWN AEROBIC AND ANAEROBIC Blood Culture adequate volume   Culture   Final    NO GROWTH 4 DAYS Performed at [facility], [address], [place], [place] [address]    Report Status PENDING  Incomplete     Labs: BNP (last 3 results) No results for input(s): "BNP" in the last 8760 hours. Basic Metabolic Panel: Recent Labs  Lab 01/28/24 0805  01/29/24 0118 01/29/24 0930 01/30/24 0318 01/31/24 0255 02/01/24 0031  NA 138 139  --  139 138 140  K 3.3* 3.2*  --  4.9 4.6 4.1  CL 106 103  --  103 103 107  CO2 21* 26  --  26 28 25   GLUCOSE 107* 107*  --  137* 142* 161*  BUN 11 11  --  15 23 18   CREATININE 0.82 0.86  --  0.88 1.00 0.84  CALCIUM 8.8* 8.9  --  9.2 9.0 8.4*  MG  --   --  1.9  --   --   --    Liver Function Tests: Recent Labs  Lab 01/28/24 0805 01/29/24 0118 01/30/24 0318 01/31/24 0255 02/01/24 0031  AST 32 34 35 21 24  ALT 62* 55* 49* 36 26  ALKPHOS 90 124 102 82 73  BILITOT 2.1* 2.2* 1.1 0.7 0.8  PROT 5.8* 6.1* 5.9* 5.5* 5.0*  ALBUMIN 2.7* 2.6* 2.4* 2.3* 2.1*   No results for input(s): "LIPASE", "AMYLASE" in the last 168 hours. No results for input(s): "AMMONIA" in the last 168 hours. CBC: Recent Labs  Lab 01/27/24 1648 01/27/24 1651 01/28/24 0805 01/29/24 0118 01/30/24 0318 01/31/24 0255 02/01/24 0031  WBC 24.9*  --  20.7* 18.2* 11.8* 16.3* 10.3  NEUTROABS 21.7*  --   --   --   --   --   --   HGB 16.7*   < > 15.8* 15.1* 15.2* 14.1 12.6  HCT 48.5*   < > 45.9 45.0 46.0 41.8 37.9  MCV 91.0  --  91.1 92.0 92.7 91.9 94.0  PLT 283  --  233 255 327 349 265   < > = values in this interval not displayed.   Cardiac Enzymes: No results for input(s): "CKTOTAL", "CKMB", "CKMBINDEX", "TROPONINI" in the last 168 hours. BNP: Invalid input(s): "POCBNP" CBG: No results for input(s): "GLUCAP" in the last 168 hours. D-Dimer No results for input(s): "DDIMER" in the last 72 hours. Hgb A1c No results for input(s): "HGBA1C" in the last 72 hours. Lipid Profile  No results for input(s): "CHOL", "HDL", "LDLCALC", "TRIG", "CHOLHDL", "LDLDIRECT" in the last 72 hours. Thyroid  function studies No results for input(s): "TSH", "T4TOTAL", "T3FREE", "THYROIDAB" in the last 72 hours.  Invalid input(s): "FREET3" Anemia work up No results for input(s): "VITAMINB12", "FOLATE", "FERRITIN", "TIBC", "IRON", "RETICCTPCT" in  the last 72 hours. Urinalysis    Component Value Date/Time   COLORURINE YELLOW 12/13/2020 1800   APPEARANCEUR HAZY (A) 12/13/2020 1800   LABSPEC 1.012 12/13/2020 1800   PHURINE 5.0 12/13/2020 1800   GLUCOSEU NEGATIVE 12/13/2020 1800   HGBUR LARGE (A) 12/13/2020 1800   BILIRUBINUR NEGATIVE 12/13/2020 1800   KETONESUR 20 (A) 12/13/2020 1800   PROTEINUR 100 (A) 12/13/2020 1800   NITRITE NEGATIVE 12/13/2020 1800   LEUKOCYTESUR SMALL (A) 12/13/2020 1800   Sepsis Labs Recent Labs  Lab 01/29/24 0118 01/30/24 0318 01/31/24 0255 02/01/24 0031  WBC 18.2* 11.8* 16.3* 10.3   Microbiology Recent Results (from the past 240 hours)  Blood culture (routine x 2)     Status: None (Preliminary result)   Collection Time: 01/28/24  8:05 AM   Specimen: BLOOD LEFT HAND  Result Value Ref Range Status   Specimen Description BLOOD LEFT HAND  Final   Special Requests   Final    BOTTLES DRAWN AEROBIC AND ANAEROBIC Blood Culture results may not be optimal due to an inadequate volume of blood received in culture bottles   Culture   Final    NO GROWTH 4 DAYS Performed at [facility], [address], [place], [place] [address]    Report Status PENDING  Incomplete  Blood culture (routine x 2)     Status: None (Preliminary result)   Collection Time: 01/28/24  8:07 AM   Specimen: BLOOD RIGHT HAND  Result Value Ref Range Status   Specimen Description BLOOD RIGHT HAND  Final   Special Requests   Final    BOTTLES DRAWN AEROBIC AND ANAEROBIC Blood Culture adequate volume   Culture   Final    NO GROWTH 4 DAYS Performed at [facility], [address], [place], [place] [address]    Report Status PENDING  Incomplete     Time coordinating discharge: Over 30 minutes  SIGNED:   Rema Care [place], DO  [facility] 02/01/2024, 9:50 AM

## 2024-02-01 NOTE — Progress Notes (Signed)
 OT Cancellation Note  Patient Details Name: Peggy Williams MRN: [id number] DOB: 1929-08-14   Cancelled Treatment:    Reason Eval/Treat Not Completed: Patient declined, no reason specified. Per pt's son, pt is d/c to skilled rehab today and pleasantly declined session.  Kenna Kirn C, OT  Acute Rehabilitation Services Office [phone] Secure chat preferred   Mickael Alamo 02/01/2024, 11:39 AM

## 2024-02-01 NOTE — TOC Transition Note (Signed)
 Transition of Care [facility]) - Discharge Note   Patient Details  Name: Peggy Williams MRN: [id number] Date of Birth: 1929-05-09  [facility] CM/SW Contact:  Elspeth Hals, LCSW Phone Number: 02/01/2024, 2:00 PM   Clinical Narrative:   Pt discharging to [facility], room 410a.  RN call report to [phone].  Son will provide transportation and will need pt brought down to main north tower entrance with assistance into the vehicle.  Per [facility], room will not be ready until 3pm.      Final next level of care: Skilled Nursing Facility Barriers to Discharge: Barriers Resolved   Patient Goals and CMS Choice     Choice offered to / list presented to : Adult Children [place] ownership interest in [facility].provided to:: Adult Children (son [name])    Discharge Placement              Patient chooses bed at: [facility] Patient to be transferred to facility by: son [name] Name of family member notified: son [name] in room Patient and family notified of of transfer: 02/01/24  Discharge Plan and Services Additional resources added to the After Visit Summary for   In-house Referral: Clinical Social Work   Post Acute Care Choice: Skilled Nursing Facility                               Social Drivers of Health (SDOH) Interventions SDOH Screenings   Food Insecurity: No Food Insecurity (01/27/2024)  Housing: Low Risk  (01/27/2024)  Transportation Needs: No Transportation Needs (01/27/2024)  Utilities: Not At Risk (01/27/2024)  Social Connections: Moderately Isolated (01/27/2024)  Tobacco Use: Medium Risk (01/29/2024)     Readmission Risk Interventions     No data to display

## 2024-02-02 LAB — CULTURE, BLOOD (ROUTINE X 2)
Culture: NO GROWTH
Culture: NO GROWTH
Special Requests: ADEQUATE

## 2024-02-05 DIAGNOSIS — I10 Essential (primary) hypertension: Secondary | ICD-10-CM | POA: Diagnosis not present

## 2024-02-05 DIAGNOSIS — E039 Hypothyroidism, unspecified: Secondary | ICD-10-CM | POA: Diagnosis not present

## 2024-02-05 DIAGNOSIS — I4891 Unspecified atrial fibrillation: Secondary | ICD-10-CM | POA: Diagnosis not present

## 2024-02-05 DIAGNOSIS — N183 Chronic kidney disease, stage 3 unspecified: Secondary | ICD-10-CM | POA: Diagnosis not present

## 2024-02-07 DIAGNOSIS — Z9049 Acquired absence of other specified parts of digestive tract: Secondary | ICD-10-CM | POA: Diagnosis not present

## 2024-02-07 DIAGNOSIS — I10 Essential (primary) hypertension: Secondary | ICD-10-CM | POA: Diagnosis not present

## 2024-02-07 DIAGNOSIS — R531 Weakness: Secondary | ICD-10-CM | POA: Diagnosis not present

## 2024-02-07 DIAGNOSIS — I480 Paroxysmal atrial fibrillation: Secondary | ICD-10-CM | POA: Diagnosis not present

## 2024-02-08 DIAGNOSIS — I10 Essential (primary) hypertension: Secondary | ICD-10-CM | POA: Diagnosis not present

## 2024-02-08 DIAGNOSIS — I480 Paroxysmal atrial fibrillation: Secondary | ICD-10-CM | POA: Diagnosis not present

## 2024-02-08 DIAGNOSIS — I639 Cerebral infarction, unspecified: Secondary | ICD-10-CM | POA: Diagnosis not present

## 2024-02-08 DIAGNOSIS — E039 Hypothyroidism, unspecified: Secondary | ICD-10-CM | POA: Diagnosis not present

## 2024-02-15 DIAGNOSIS — R278 Other lack of coordination: Secondary | ICD-10-CM | POA: Diagnosis not present

## 2024-02-15 DIAGNOSIS — I639 Cerebral infarction, unspecified: Secondary | ICD-10-CM | POA: Diagnosis not present

## 2024-02-15 DIAGNOSIS — K810 Acute cholecystitis: Secondary | ICD-10-CM | POA: Diagnosis not present

## 2024-02-15 DIAGNOSIS — R4701 Aphasia: Secondary | ICD-10-CM | POA: Diagnosis not present

## 2024-02-15 DIAGNOSIS — R2681 Unsteadiness on feet: Secondary | ICD-10-CM | POA: Diagnosis not present

## 2024-02-15 DIAGNOSIS — M6281 Muscle weakness (generalized): Secondary | ICD-10-CM | POA: Diagnosis not present

## 2024-02-19 DIAGNOSIS — R4701 Aphasia: Secondary | ICD-10-CM | POA: Diagnosis not present

## 2024-02-19 DIAGNOSIS — I639 Cerebral infarction, unspecified: Secondary | ICD-10-CM | POA: Diagnosis not present

## 2024-02-19 DIAGNOSIS — R278 Other lack of coordination: Secondary | ICD-10-CM | POA: Diagnosis not present

## 2024-02-19 DIAGNOSIS — K810 Acute cholecystitis: Secondary | ICD-10-CM | POA: Diagnosis not present

## 2024-02-19 DIAGNOSIS — R2681 Unsteadiness on feet: Secondary | ICD-10-CM | POA: Diagnosis not present

## 2024-02-19 DIAGNOSIS — M6281 Muscle weakness (generalized): Secondary | ICD-10-CM | POA: Diagnosis not present

## 2024-02-25 DIAGNOSIS — K810 Acute cholecystitis: Secondary | ICD-10-CM | POA: Diagnosis not present

## 2024-02-25 DIAGNOSIS — R4701 Aphasia: Secondary | ICD-10-CM | POA: Diagnosis not present

## 2024-02-25 DIAGNOSIS — I639 Cerebral infarction, unspecified: Secondary | ICD-10-CM | POA: Diagnosis not present

## 2024-02-25 DIAGNOSIS — R278 Other lack of coordination: Secondary | ICD-10-CM | POA: Diagnosis not present

## 2024-02-25 DIAGNOSIS — M6281 Muscle weakness (generalized): Secondary | ICD-10-CM | POA: Diagnosis not present

## 2024-02-25 DIAGNOSIS — R2681 Unsteadiness on feet: Secondary | ICD-10-CM | POA: Diagnosis not present

## 2024-02-27 DIAGNOSIS — R2681 Unsteadiness on feet: Secondary | ICD-10-CM | POA: Diagnosis not present

## 2024-02-27 DIAGNOSIS — R278 Other lack of coordination: Secondary | ICD-10-CM | POA: Diagnosis not present

## 2024-02-27 DIAGNOSIS — M6281 Muscle weakness (generalized): Secondary | ICD-10-CM | POA: Diagnosis not present

## 2024-02-27 DIAGNOSIS — I639 Cerebral infarction, unspecified: Secondary | ICD-10-CM | POA: Diagnosis not present

## 2024-02-27 DIAGNOSIS — K810 Acute cholecystitis: Secondary | ICD-10-CM | POA: Diagnosis not present

## 2024-02-27 DIAGNOSIS — R4701 Aphasia: Secondary | ICD-10-CM | POA: Diagnosis not present

## 2024-03-03 DIAGNOSIS — K810 Acute cholecystitis: Secondary | ICD-10-CM | POA: Diagnosis not present

## 2024-03-03 DIAGNOSIS — I639 Cerebral infarction, unspecified: Secondary | ICD-10-CM | POA: Diagnosis not present

## 2024-03-03 DIAGNOSIS — R2681 Unsteadiness on feet: Secondary | ICD-10-CM | POA: Diagnosis not present

## 2024-03-03 DIAGNOSIS — R4701 Aphasia: Secondary | ICD-10-CM | POA: Diagnosis not present

## 2024-03-03 DIAGNOSIS — M6281 Muscle weakness (generalized): Secondary | ICD-10-CM | POA: Diagnosis not present

## 2024-03-03 DIAGNOSIS — R278 Other lack of coordination: Secondary | ICD-10-CM | POA: Diagnosis not present

## 2024-03-04 DIAGNOSIS — H43813 Vitreous degeneration, bilateral: Secondary | ICD-10-CM | POA: Diagnosis not present

## 2024-03-04 DIAGNOSIS — H353123 Nonexudative age-related macular degeneration, left eye, advanced atrophic without subfoveal involvement: Secondary | ICD-10-CM | POA: Diagnosis not present

## 2024-03-04 DIAGNOSIS — H353221 Exudative age-related macular degeneration, left eye, with active choroidal neovascularization: Secondary | ICD-10-CM | POA: Diagnosis not present

## 2024-03-04 DIAGNOSIS — H35033 Hypertensive retinopathy, bilateral: Secondary | ICD-10-CM | POA: Diagnosis not present

## 2024-03-04 DIAGNOSIS — Z961 Presence of intraocular lens: Secondary | ICD-10-CM | POA: Diagnosis not present

## 2024-03-04 DIAGNOSIS — H353114 Nonexudative age-related macular degeneration, right eye, advanced atrophic with subfoveal involvement: Secondary | ICD-10-CM | POA: Diagnosis not present

## 2024-03-05 DIAGNOSIS — I4891 Unspecified atrial fibrillation: Secondary | ICD-10-CM | POA: Diagnosis not present

## 2024-03-05 DIAGNOSIS — R278 Other lack of coordination: Secondary | ICD-10-CM | POA: Diagnosis not present

## 2024-03-05 DIAGNOSIS — R4701 Aphasia: Secondary | ICD-10-CM | POA: Diagnosis not present

## 2024-03-05 DIAGNOSIS — E039 Hypothyroidism, unspecified: Secondary | ICD-10-CM | POA: Diagnosis not present

## 2024-03-05 DIAGNOSIS — I639 Cerebral infarction, unspecified: Secondary | ICD-10-CM | POA: Diagnosis not present

## 2024-03-05 DIAGNOSIS — R2681 Unsteadiness on feet: Secondary | ICD-10-CM | POA: Diagnosis not present

## 2024-03-05 DIAGNOSIS — M6281 Muscle weakness (generalized): Secondary | ICD-10-CM | POA: Diagnosis not present

## 2024-03-05 DIAGNOSIS — K810 Acute cholecystitis: Secondary | ICD-10-CM | POA: Diagnosis not present

## 2024-03-05 DIAGNOSIS — I10 Essential (primary) hypertension: Secondary | ICD-10-CM | POA: Diagnosis not present

## 2024-03-10 DIAGNOSIS — I639 Cerebral infarction, unspecified: Secondary | ICD-10-CM | POA: Diagnosis not present

## 2024-03-10 DIAGNOSIS — R2681 Unsteadiness on feet: Secondary | ICD-10-CM | POA: Diagnosis not present

## 2024-03-10 DIAGNOSIS — R4701 Aphasia: Secondary | ICD-10-CM | POA: Diagnosis not present

## 2024-03-10 DIAGNOSIS — M6281 Muscle weakness (generalized): Secondary | ICD-10-CM | POA: Diagnosis not present

## 2024-03-10 DIAGNOSIS — R278 Other lack of coordination: Secondary | ICD-10-CM | POA: Diagnosis not present

## 2024-03-10 DIAGNOSIS — K810 Acute cholecystitis: Secondary | ICD-10-CM | POA: Diagnosis not present

## 2024-03-11 DIAGNOSIS — R2681 Unsteadiness on feet: Secondary | ICD-10-CM | POA: Diagnosis not present

## 2024-03-11 DIAGNOSIS — M6281 Muscle weakness (generalized): Secondary | ICD-10-CM | POA: Diagnosis not present

## 2024-03-11 DIAGNOSIS — K810 Acute cholecystitis: Secondary | ICD-10-CM | POA: Diagnosis not present

## 2024-03-12 DIAGNOSIS — M6281 Muscle weakness (generalized): Secondary | ICD-10-CM | POA: Diagnosis not present

## 2024-03-12 DIAGNOSIS — R2681 Unsteadiness on feet: Secondary | ICD-10-CM | POA: Diagnosis not present

## 2024-03-12 DIAGNOSIS — K810 Acute cholecystitis: Secondary | ICD-10-CM | POA: Diagnosis not present

## 2024-03-17 DIAGNOSIS — K810 Acute cholecystitis: Secondary | ICD-10-CM | POA: Diagnosis not present

## 2024-03-17 DIAGNOSIS — M6281 Muscle weakness (generalized): Secondary | ICD-10-CM | POA: Diagnosis not present

## 2024-03-17 DIAGNOSIS — R2681 Unsteadiness on feet: Secondary | ICD-10-CM | POA: Diagnosis not present

## 2024-03-19 DIAGNOSIS — R2681 Unsteadiness on feet: Secondary | ICD-10-CM | POA: Diagnosis not present

## 2024-03-19 DIAGNOSIS — M6281 Muscle weakness (generalized): Secondary | ICD-10-CM | POA: Diagnosis not present

## 2024-03-19 DIAGNOSIS — K810 Acute cholecystitis: Secondary | ICD-10-CM | POA: Diagnosis not present

## 2024-03-24 DIAGNOSIS — R2681 Unsteadiness on feet: Secondary | ICD-10-CM | POA: Diagnosis not present

## 2024-03-24 DIAGNOSIS — K810 Acute cholecystitis: Secondary | ICD-10-CM | POA: Diagnosis not present

## 2024-03-24 DIAGNOSIS — M6281 Muscle weakness (generalized): Secondary | ICD-10-CM | POA: Diagnosis not present

## 2024-03-26 DIAGNOSIS — R2681 Unsteadiness on feet: Secondary | ICD-10-CM | POA: Diagnosis not present

## 2024-03-26 DIAGNOSIS — K810 Acute cholecystitis: Secondary | ICD-10-CM | POA: Diagnosis not present

## 2024-03-26 DIAGNOSIS — M6281 Muscle weakness (generalized): Secondary | ICD-10-CM | POA: Diagnosis not present

## 2024-03-31 DIAGNOSIS — K810 Acute cholecystitis: Secondary | ICD-10-CM | POA: Diagnosis not present

## 2024-03-31 DIAGNOSIS — M6281 Muscle weakness (generalized): Secondary | ICD-10-CM | POA: Diagnosis not present

## 2024-03-31 DIAGNOSIS — R2681 Unsteadiness on feet: Secondary | ICD-10-CM | POA: Diagnosis not present

## 2024-04-02 DIAGNOSIS — R2681 Unsteadiness on feet: Secondary | ICD-10-CM | POA: Diagnosis not present

## 2024-04-02 DIAGNOSIS — K810 Acute cholecystitis: Secondary | ICD-10-CM | POA: Diagnosis not present

## 2024-04-02 DIAGNOSIS — M6281 Muscle weakness (generalized): Secondary | ICD-10-CM | POA: Diagnosis not present

## 2024-04-04 DIAGNOSIS — Z515 Encounter for palliative care: Secondary | ICD-10-CM | POA: Diagnosis not present

## 2024-04-08 DIAGNOSIS — R2681 Unsteadiness on feet: Secondary | ICD-10-CM | POA: Diagnosis not present

## 2024-04-08 DIAGNOSIS — M6281 Muscle weakness (generalized): Secondary | ICD-10-CM | POA: Diagnosis not present

## 2024-04-08 DIAGNOSIS — K810 Acute cholecystitis: Secondary | ICD-10-CM | POA: Diagnosis not present

## 2024-04-09 DIAGNOSIS — K810 Acute cholecystitis: Secondary | ICD-10-CM | POA: Diagnosis not present

## 2024-04-09 DIAGNOSIS — R2681 Unsteadiness on feet: Secondary | ICD-10-CM | POA: Diagnosis not present

## 2024-04-09 DIAGNOSIS — M6281 Muscle weakness (generalized): Secondary | ICD-10-CM | POA: Diagnosis not present

## 2024-04-14 DIAGNOSIS — R630 Anorexia: Secondary | ICD-10-CM | POA: Diagnosis not present

## 2024-04-14 DIAGNOSIS — Z91120 Patient's intentional underdosing of medication regimen due to financial hardship: Secondary | ICD-10-CM | POA: Diagnosis not present

## 2024-04-15 ENCOUNTER — Emergency Department (HOSPITAL_COMMUNITY)
Admission: EM | Admit: 2024-04-15 | Discharge: 2024-04-15 | Disposition: A | Discharge status: Home / Self Care | Attending: Emergency Medicine | Admitting: Emergency Medicine

## 2024-04-15 ENCOUNTER — Other Ambulatory Visit: Payer: Self-pay

## 2024-04-15 ENCOUNTER — Emergency Department (HOSPITAL_COMMUNITY)

## 2024-04-15 DIAGNOSIS — E039 Hypothyroidism, unspecified: Secondary | ICD-10-CM | POA: Insufficient documentation

## 2024-04-15 DIAGNOSIS — I4891 Unspecified atrial fibrillation: Secondary | ICD-10-CM | POA: Diagnosis not present

## 2024-04-15 DIAGNOSIS — Z7901 Long term (current) use of anticoagulants: Secondary | ICD-10-CM | POA: Diagnosis not present

## 2024-04-15 DIAGNOSIS — Y92009 Unspecified place in unspecified non-institutional (private) residence as the place of occurrence of the external cause: Secondary | ICD-10-CM | POA: Insufficient documentation

## 2024-04-15 DIAGNOSIS — Z79899 Other long term (current) drug therapy: Secondary | ICD-10-CM | POA: Diagnosis not present

## 2024-04-15 DIAGNOSIS — Z043 Encounter for examination and observation following other accident: Secondary | ICD-10-CM | POA: Diagnosis not present

## 2024-04-15 DIAGNOSIS — M19011 Primary osteoarthritis, right shoulder: Secondary | ICD-10-CM | POA: Diagnosis not present

## 2024-04-15 DIAGNOSIS — S8012XA Contusion of left lower leg, initial encounter: Secondary | ICD-10-CM | POA: Insufficient documentation

## 2024-04-15 DIAGNOSIS — W19XXXA Unspecified fall, initial encounter: Secondary | ICD-10-CM | POA: Insufficient documentation

## 2024-04-15 DIAGNOSIS — S41111A Laceration without foreign body of right upper arm, initial encounter: Secondary | ICD-10-CM | POA: Insufficient documentation

## 2024-04-15 DIAGNOSIS — R4701 Aphasia: Secondary | ICD-10-CM | POA: Diagnosis not present

## 2024-04-15 DIAGNOSIS — S81012A Laceration without foreign body, left knee, initial encounter: Secondary | ICD-10-CM | POA: Diagnosis not present

## 2024-04-15 DIAGNOSIS — S81811A Laceration without foreign body, right lower leg, initial encounter: Secondary | ICD-10-CM | POA: Diagnosis not present

## 2024-04-15 DIAGNOSIS — I10 Essential (primary) hypertension: Secondary | ICD-10-CM | POA: Diagnosis not present

## 2024-04-15 DIAGNOSIS — S8011XA Contusion of right lower leg, initial encounter: Secondary | ICD-10-CM | POA: Insufficient documentation

## 2024-04-15 DIAGNOSIS — S81812A Laceration without foreign body, left lower leg, initial encounter: Secondary | ICD-10-CM

## 2024-04-15 DIAGNOSIS — S4991XA Unspecified injury of right shoulder and upper arm, initial encounter: Secondary | ICD-10-CM | POA: Diagnosis present

## 2024-04-15 DIAGNOSIS — M85811 Other specified disorders of bone density and structure, right shoulder: Secondary | ICD-10-CM | POA: Diagnosis not present

## 2024-04-15 DIAGNOSIS — M1712 Unilateral primary osteoarthritis, left knee: Secondary | ICD-10-CM | POA: Diagnosis not present

## 2024-04-15 MED ORDER — LIDOCAINE-EPINEPHRINE (PF) 2 %-1:200000 IJ SOLN
10.0000 mL | Freq: Once | INTRAMUSCULAR | Status: AC
  Administered 2024-04-15: 10 mL
  Filled 2024-04-15: qty 20

## 2024-04-15 MED ORDER — ACETAMINOPHEN 325 MG PO TABS
650.0000 mg | ORAL_TABLET | Freq: Four times a day (QID) | ORAL | Status: DC | PRN
  Administered 2024-04-15: 650 mg via ORAL
  Filled 2024-04-15: qty 2

## 2024-04-15 NOTE — ED Provider Notes (Signed)
 [place] [facility] Provider Note   CSN: [id number] Arrival date & time: 04/15/24  0900     Patient presents with:  unwitnessed fall with laceration to right upper arm    Peggy Williams is a 89 y.o. female with PMH of atrial fibrillation on Eliquis , multiple falls, CVA due to aneurysm repair, aphasia, hypothyroidism presents after an unwitnessed fall at her facility.  Per EMS patient was found in her recliner with 2 lacerations.  No signs of blood anywhere on the furniture.  Patient has aphasia and is able to express 1 or 2 words but is not able to put together full explanation of what happened to cause her to fall.  Patient denies that she hit her head.  She initially denied any pain, but upon further questioning did endorse some pain in her right shoulder.  Could be due to her laceration there.  She denied any neck pain and c-collar was removed.  Per EMS, patient's son who is also POA told that patient is usually A&O x 4 but does have aphasia at baseline.  Patient has had at least 2 presentations to the hospital for falls in the last 6 months. Patient's daughter in law was present later at bedside and noted that this is what her mother in law is usually like as far as mood and mentation.    HPI     Prior to Admission medications   Medication Sig Start Date End Date Taking? Authorizing Provider  acetaminophen  (TYLENOL ) 500 MG tablet Take 1 tablet (500 mg total) by mouth every 6 (six) hours as needed for mild pain (pain score 1-3), fever or headache. 02/01/24   [place], Camellia PARAS, DO  amLODipine (NORVASC) 2.5 MG tablet Take 2.5 mg by mouth in the morning.    [provider]  apixaban  (ELIQUIS ) 5 MG TABS tablet Take 1 tablet (5 mg total) by mouth 2 (two) times daily. 08/02/21   Hobart Powell FORBES, MD  digoxin  (LANOXIN ) 0.125 MG tablet Take 1 tablet (0.125 mg total) by mouth daily. Please make overdue appt with Dr. Hobart before anymore refills.  Thank you 1st attempt Patient taking differently: Take 0.125 mg by mouth in the morning. 01/06/22   Hobart Powell FORBES, MD  diltiazem  (CARDIZEM  CD) 240 MG 24 hr capsule Take 1 capsule (240 mg total) by mouth daily. Pt. Needs to make an overdue appt. With Cardiologist in order to receive future refills. Thank You. 2nd Attempt. Patient taking differently: Take 240 mg by mouth in the morning. 03/02/22   Hobart Powell FORBES, MD  furosemide  (LASIX ) 20 MG tablet Take 1 tablet (20 mg total) by mouth daily. Patient taking differently: Take 20 mg by mouth in the morning. 12/09/20   Hobart Powell FORBES, MD  levothyroxine  (SYNTHROID ) 75 MCG tablet Take 75 mcg by mouth daily before breakfast.    [provider]  metoprolol  succinate (TOPROL -XL) 25 MG 24 hr tablet Take 0.5 tablets (12.5 mg total) by mouth in the morning and at bedtime. Please make overdue appt with Dr. Hobart Cardiologist before anymore refills. Thank you 3rd and Final Attempt Patient taking differently: Take 25 mg by mouth in the morning. 01/24/22   Hobart Powell FORBES, MD  Multiple Vitamins-Minerals [facility] ADULT 50+) CAPS Take 1 capsule by mouth in the morning.    [provider]  oxyCODONE  (ROXICODONE ) 5 MG immediate release tablet Take 0.5-1 tablets (2.5-5 mg total) by mouth every 8 (eight) hours as needed for moderate  pain (pain score 4-6) or severe pain (pain score 7-10). 02/01/24 01/31/25  [place], Camellia PARAS, DO  polyethylene glycol powder (GLYCOLAX /MIRALAX ) 17 GM/SCOOP powder Take 17 g by mouth in the morning.    [provider]  potassium chloride  (KLOR-CON ) 10 MEQ tablet Take one (1) tablet by mouth (10 meq) daily. 3RD FINAL   ATTEMPT. PT IS OVERDUE FOR AN APPT. CAN OK 30 DAYS SUPPLY. PLEASE HAVE PT CALL TO SCHEDULE appointment. Patient taking differently: Take 10 mEq by mouth in the morning. 02/07/22   Hobart Powell BRAVO, MD  Vitamin D-Vitamin K (K2-D3 10,000) 10000-45 UNIT-MCG CAPS Take 2 capsules by mouth  in the morning.    [provider]    Allergies: Patient has no known allergies.    Review of Systems Negative unless noted in HPI.  Updated Vital Signs BP (!) 146/102 (BP Location: Left Arm)   Pulse 100   Temp 97.6 F (36.4 C) (Oral)   Resp 20   SpO2 94%   Physical Exam HENT:     Head: Atraumatic.  Cardiovascular:     Rate and Rhythm: Normal rate. Rhythm irregular.     Comments: Radial pulses are palpable bilaterally  DP pulses are hard to palpate manually but capillary refill intact and skin is warm to the touch  Pulmonary:     Effort: Pulmonary effort is normal. No respiratory distress.  Skin:    Comments: Chronic bruising on bilateral anterior shins  Skin tear with red blood on front of left knee  Deeper laceration noted on the underside of the upper arm, with some fatty tissue noticeable on exam.   Neurological:     Mental Status: She is alert.     (all labs ordered are listed, but only abnormal results are displayed) Labs Reviewed - No data to display  EKG: EKG Interpretation Date/Time:  Tuesday April 15 2024 09:01:28 EDT Ventricular Rate:  94 PR Interval:    QRS Duration:  89 QT Interval:  304 QTC Calculation: 381 R Axis:   -79  Text Interpretation: Atrial fibrillation Inferior infarct, age indeterminate Lateral leads are also involved Confirmed by Doretha Folks ([id number]) on 04/15/2024 9:08:10 AM  Radiology: No results found.   Procedures   Medications Ordered in the ED  lidocaine -EPINEPHrine  (XYLOCAINE  W/EPI) 2 %-1:200000 (PF) injection 10 mL (has no administration in time range)                                    Medical Decision Making Amount and/or Complexity of Data Reviewed Radiology: ordered.  Risk OTC drugs. Prescription drug management.  Differential for fall could be balance issues, frailty, syncopal episode, orthostatic hypotension, cardiac arrhythmia including atrial fibrillation, infection such as UTI or pneumonia .   Patient presented for laceration repair and for imaging of affected areas from her unwitnessed fall. Patient is usually A&Ox4 at baseline has aphasia at baseline as well. Patient seems to be at baseline and is unable to tell us  how she fell. It seems like from lacerations that patient could have fallen on her knee and gotten laceration to her arm just from clipping a sharp edge of furniture. She does have history of osteoarthritis in her knees bilaterally which could have contributed to her fall. Per EMS patient was also found sitting in her chair so she was capable of getting herself off the ground. At this time laceration on knee is just a skin  tear and can be bandaged. She will need some sutures for the laceration on her right arm. Will also get imaging of right shoulder and elbow due to patient endorsing pain in the shoulder. Will also get left knee imaging due to laceration. Patient was able to move limbs spontaneously on exam without pain limiting which is reassuring.   Final assessment and plan:  Laceration to arm was fixed with dissolvable sutures, xeroform dressing and wrapped up in gauze. Steri strips were placed along the edge of wound to be able to approximate the edges  Left knee xeroform dressing left in place with gauze on top and dressing wrapped around and secured with tape. Patient was given tylenol  650 mg for pain. Imaging did not show any acute fractures, but did show age related changes to the knee and  right AC joint. Also showed small ossific fragment next to the olecranon that could be due to an avulsion fracture at the triceps insertion and multiple right rib deformities consistent with prior healed fractures. Patient to be discharged back to facility. Sutures will not need to be removed, but should have daily dressing changes. Patient can take one extra strength tylenol  every 6 hours as needed for pain. Planned discussed with patient and patients's daughter in law.      Final  diagnoses:  None    ED Discharge Orders     None          D'Mello, Mykael Trott, DO 04/15/24 1338    Doretha Folks, MD 04/15/24 [phone]

## 2024-04-15 NOTE — ED Notes (Signed)
 Called report to assisted living facility.    [phone]

## 2024-04-15 NOTE — ED Notes (Signed)
 Got patient on the monitor straighten up in the bed got patient vitals and EKG shown to er provider patient is resting with call bell in reach

## 2024-04-15 NOTE — Discharge Instructions (Signed)
 You came in today because you had possibly a fall that was not witnessed. We noticed that you had a cut on the underside of your arm and also an abrasion on your knee. We were able to put in dissolvable sutures for you to fix up part of your cut then added some further dressing for your right arm and your left knee. We also did some further imaging of your left knee and right shoulder and elbow since you were having some shoulder pain. The x rays of those areas did not show any acute fracture For pain you can take 1 extra strength tylenol  every 6 hours as needed when you get back to your living facility. The sutures that are in place are dissolvable and will not need to be taken out. Please have the staff at your facility change your dressing daily for your right arm and left knee.

## 2024-04-15 NOTE — ED Notes (Signed)
   Patient safely exited the ED with fAMILY. Vss. Nad.

## 2024-04-15 NOTE — ED Provider Notes (Signed)
 LACERATION REPAIR Performed by: [facility] Authorized by: Benton Shone Consent: Verbal consent obtained. Risks and benefits: risks, benefits and alternatives were discussed Consent given by: patient Patient identity confirmed: provided demographic data Prepped and Draped in normal sterile fashion Wound explored  Laceration Location: left posterior upper arm  Laceration Length: 5cm  No Foreign Bodies seen or palpated  Anesthesia: local infiltration  Local anesthetic: lidocaine  2% with epinephrine   Anesthetic total: 5 ml  Irrigation method: syringe Amount of cleaning: standard  Skin closure: 4.0 vicryl rapide and steri strips  Number of sutures: 6  Technique: simple interrupted  Patient tolerance: Patient tolerated the procedure well with no immediate complications.    Shone Benton, MD 04/15/24 [phone]

## 2024-04-15 NOTE — ED Triage Notes (Signed)
 [facility] Pt coming from [facility]  Unwitnessed fall - Unknown the mechanism or landing position of the pt/whether she hit her head etc  When Staff came in they found her in her recliner with her Right arm having sustained a severe skin tear  Bandage on left knee as well   The patient is on blood thinners

## 2024-04-15 NOTE — ED Notes (Signed)
 Provider, D'mello, DO, at bedside to perform suturing.   Patient's Daughter in law, Aleigha, arrived at this time as well.

## 2024-05-09 DIAGNOSIS — Z515 Encounter for palliative care: Secondary | ICD-10-CM | POA: Diagnosis not present

## 2024-05-22 DIAGNOSIS — I129 Hypertensive chronic kidney disease with stage 1 through stage 4 chronic kidney disease, or unspecified chronic kidney disease: Secondary | ICD-10-CM | POA: Diagnosis not present

## 2024-05-22 DIAGNOSIS — R296 Repeated falls: Secondary | ICD-10-CM | POA: Diagnosis not present

## 2024-05-22 DIAGNOSIS — N189 Chronic kidney disease, unspecified: Secondary | ICD-10-CM | POA: Diagnosis not present

## 2024-06-02 DIAGNOSIS — Z7901 Long term (current) use of anticoagulants: Secondary | ICD-10-CM | POA: Diagnosis not present

## 2024-06-02 DIAGNOSIS — Z79899 Other long term (current) drug therapy: Secondary | ICD-10-CM | POA: Diagnosis not present

## 2024-06-18 DIAGNOSIS — E039 Hypothyroidism, unspecified: Secondary | ICD-10-CM | POA: Diagnosis not present

## 2024-06-18 DIAGNOSIS — I10 Essential (primary) hypertension: Secondary | ICD-10-CM | POA: Diagnosis not present

## 2024-07-01 DIAGNOSIS — H353221 Exudative age-related macular degeneration, left eye, with active choroidal neovascularization: Secondary | ICD-10-CM | POA: Diagnosis not present

## 2024-07-01 DIAGNOSIS — H43813 Vitreous degeneration, bilateral: Secondary | ICD-10-CM | POA: Diagnosis not present

## 2024-07-01 DIAGNOSIS — Z961 Presence of intraocular lens: Secondary | ICD-10-CM | POA: Diagnosis not present

## 2024-07-01 DIAGNOSIS — H35033 Hypertensive retinopathy, bilateral: Secondary | ICD-10-CM | POA: Diagnosis not present

## 2024-07-01 DIAGNOSIS — H353134 Nonexudative age-related macular degeneration, bilateral, advanced atrophic with subfoveal involvement: Secondary | ICD-10-CM | POA: Diagnosis not present

## 2024-07-23 DIAGNOSIS — F3340 Major depressive disorder, recurrent, in remission, unspecified: Secondary | ICD-10-CM | POA: Diagnosis not present

## 2024-07-23 DIAGNOSIS — R413 Other amnesia: Secondary | ICD-10-CM | POA: Diagnosis not present

## 2024-07-23 DIAGNOSIS — E785 Hyperlipidemia, unspecified: Secondary | ICD-10-CM | POA: Diagnosis not present

## 2024-07-23 DIAGNOSIS — I10 Essential (primary) hypertension: Secondary | ICD-10-CM | POA: Diagnosis not present

## 2024-08-13 DIAGNOSIS — I10 Essential (primary) hypertension: Secondary | ICD-10-CM | POA: Diagnosis not present

## 2024-08-13 DIAGNOSIS — E039 Hypothyroidism, unspecified: Secondary | ICD-10-CM | POA: Diagnosis not present

## 2024-08-13 DIAGNOSIS — I4891 Unspecified atrial fibrillation: Secondary | ICD-10-CM | POA: Diagnosis not present
